# Patient Record
Sex: Female | Born: 1960 | Race: Black or African American | Hispanic: No | Marital: Single | Smoking: Never smoker
Health system: Southern US, Community
[De-identification: ages and names within clinical notes are randomized; demographics above are authoritative.]

## PROBLEM LIST (undated history)

## (undated) DIAGNOSIS — R519 Headache, unspecified: Secondary | ICD-10-CM

## (undated) DIAGNOSIS — K8020 Calculus of gallbladder without cholecystitis without obstruction: Secondary | ICD-10-CM

## (undated) DIAGNOSIS — M1990 Unspecified osteoarthritis, unspecified site: Secondary | ICD-10-CM

## (undated) DIAGNOSIS — I10 Essential (primary) hypertension: Secondary | ICD-10-CM

## (undated) DIAGNOSIS — H409 Unspecified glaucoma: Secondary | ICD-10-CM

## (undated) DIAGNOSIS — E785 Hyperlipidemia, unspecified: Secondary | ICD-10-CM

## (undated) HISTORY — DX: Calculus of gallbladder without cholecystitis without obstruction: K80.20

## (undated) HISTORY — PX: CHOLECYSTECTOMY: SHX55

---

## 1999-05-06 ENCOUNTER — Emergency Department (HOSPITAL_COMMUNITY): Admission: EM | Admit: 1999-05-06 | Discharge: 1999-05-06 | Payer: Self-pay | Admitting: Emergency Medicine

## 1999-05-11 ENCOUNTER — Encounter: Admission: RE | Admit: 1999-05-11 | Discharge: 1999-05-11 | Payer: Self-pay | Admitting: Internal Medicine

## 2002-02-11 ENCOUNTER — Other Ambulatory Visit: Admission: RE | Admit: 2002-02-11 | Discharge: 2002-02-11 | Payer: Self-pay | Admitting: Obstetrics and Gynecology

## 2005-08-15 ENCOUNTER — Other Ambulatory Visit: Admission: RE | Admit: 2005-08-15 | Discharge: 2005-08-15 | Payer: Self-pay | Admitting: Obstetrics and Gynecology

## 2011-08-23 ENCOUNTER — Other Ambulatory Visit: Payer: Self-pay | Admitting: Obstetrics and Gynecology

## 2011-08-23 DIAGNOSIS — Z1231 Encounter for screening mammogram for malignant neoplasm of breast: Secondary | ICD-10-CM

## 2011-09-12 ENCOUNTER — Ambulatory Visit
Admission: RE | Admit: 2011-09-12 | Discharge: 2011-09-12 | Disposition: A | Discharge status: Home / Self Care | Payer: BC Managed Care – PPO | Source: Ambulatory Visit | Attending: Obstetrics and Gynecology | Admitting: Obstetrics and Gynecology

## 2011-09-12 DIAGNOSIS — Z1231 Encounter for screening mammogram for malignant neoplasm of breast: Secondary | ICD-10-CM

## 2011-10-20 ENCOUNTER — Encounter: Payer: Self-pay | Admitting: Obstetrics and Gynecology

## 2013-05-09 ENCOUNTER — Telehealth: Payer: Self-pay

## 2013-05-09 NOTE — Telephone Encounter (Signed)
Patient cancelled appt. and will call back at a later time to reschedule.

## 2013-05-10 ENCOUNTER — Ambulatory Visit: Payer: BC Managed Care – PPO | Admitting: Family Medicine

## 2019-06-27 ENCOUNTER — Ambulatory Visit: Payer: Self-pay | Attending: Internal Medicine

## 2019-06-27 DIAGNOSIS — Z23 Encounter for immunization: Secondary | ICD-10-CM

## 2019-06-27 NOTE — Progress Notes (Signed)
   Covid-19 Vaccination Clinic  Name:  Rose Marquez    MRN: [id number] DOB: July 16, 1960  06/27/2019  [name] was observed post Covid-19 immunization for 15 minutes without incident. She was provided with Vaccine Information Sheet and instruction to access the V-Safe system.   [name] was instructed to call 911 with any severe reactions post vaccine: Marland Kitchen Difficulty breathing  . Swelling of face and throat  . A fast heartbeat  . A bad rash all over body  . Dizziness and weakness   Immunizations Administered    Name Date Dose VIS Date Route   Pfizer COVID-19 Vaccine 06/27/2019 11:02 AM 0.3 mL 03/08/2019 Intramuscular   Manufacturer: [employer], [employer]   Lot: [id number]   NDC: [address]-4

## 2019-07-22 ENCOUNTER — Ambulatory Visit: Payer: Self-pay | Attending: Internal Medicine

## 2019-07-22 DIAGNOSIS — Z23 Encounter for immunization: Secondary | ICD-10-CM

## 2019-07-22 NOTE — Progress Notes (Signed)
   Covid-19 Vaccination Clinic  Name:  Rose Marquez    MRN: [id number] DOB: 28-Aug-1960  07/22/2019  Rose Marquez was observed post Covid-19 immunization for 15 minutes without incident. She was provided with Vaccine Information Sheet and instruction to access the V-Safe system.   Rose Marquez was instructed to call 911 with any severe reactions post vaccine: Marland Kitchen Difficulty breathing  . Swelling of face and throat  . A fast heartbeat  . A bad rash all over body  . Dizziness and weakness   Immunizations Administered    Name Date Dose VIS Date Route   Pfizer COVID-19 Vaccine 07/22/2019 11:45 AM 0.3 mL 05/22/2018 Intramuscular   Manufacturer: [employer], [employer]   Lot: [id number]   NDC: [address]-9

## 2020-08-26 DIAGNOSIS — I671 Cerebral aneurysm, nonruptured: Secondary | ICD-10-CM

## 2020-08-26 HISTORY — DX: Cerebral aneurysm, nonruptured: I67.1

## 2020-09-15 ENCOUNTER — Other Ambulatory Visit: Payer: Self-pay

## 2020-09-15 ENCOUNTER — Inpatient Hospital Stay (HOSPITAL_COMMUNITY)
Admission: EM | Admit: 2020-09-15 | Discharge: 2020-09-22 | DRG: 027 | Disposition: A | Discharge status: Home / Self Care | Payer: BC Managed Care – PPO | Attending: Interventional Radiology | Admitting: Interventional Radiology

## 2020-09-15 ENCOUNTER — Emergency Department (HOSPITAL_COMMUNITY): Payer: BC Managed Care – PPO

## 2020-09-15 ENCOUNTER — Encounter (HOSPITAL_COMMUNITY): Payer: Self-pay

## 2020-09-15 DIAGNOSIS — Z87891 Personal history of nicotine dependence: Secondary | ICD-10-CM

## 2020-09-15 DIAGNOSIS — R519 Headache, unspecified: Secondary | ICD-10-CM

## 2020-09-15 DIAGNOSIS — I729 Aneurysm of unspecified site: Secondary | ICD-10-CM

## 2020-09-15 DIAGNOSIS — Z20822 Contact with and (suspected) exposure to covid-19: Secondary | ICD-10-CM | POA: Diagnosis present

## 2020-09-15 DIAGNOSIS — H532 Diplopia: Secondary | ICD-10-CM | POA: Diagnosis present

## 2020-09-15 DIAGNOSIS — I671 Cerebral aneurysm, nonruptured: Principal | ICD-10-CM

## 2020-09-15 DIAGNOSIS — H02402 Unspecified ptosis of left eyelid: Secondary | ICD-10-CM | POA: Diagnosis present

## 2020-09-15 DIAGNOSIS — I5189 Other ill-defined heart diseases: Secondary | ICD-10-CM

## 2020-09-15 DIAGNOSIS — H510 Palsy (spasm) of conjugate gaze: Secondary | ICD-10-CM | POA: Diagnosis present

## 2020-09-15 DIAGNOSIS — H0520 Unspecified exophthalmos: Secondary | ICD-10-CM | POA: Diagnosis present

## 2020-09-15 DIAGNOSIS — I10 Essential (primary) hypertension: Secondary | ICD-10-CM | POA: Diagnosis present

## 2020-09-15 DIAGNOSIS — R200 Anesthesia of skin: Secondary | ICD-10-CM | POA: Diagnosis present

## 2020-09-15 LAB — CBC WITH DIFFERENTIAL/PLATELET
Abs Immature Granulocytes: 0.04 10*3/uL (ref 0.00–0.07)
Basophils Absolute: 0 10*3/uL (ref 0.0–0.1)
Basophils Relative: 0 %
Eosinophils Absolute: 0 10*3/uL (ref 0.0–0.5)
Eosinophils Relative: 0 %
HCT: 44.1 % (ref 36.0–46.0)
Hemoglobin: 14.3 g/dL (ref 12.0–15.0)
Immature Granulocytes: 0 %
Lymphocytes Relative: 16 %
Lymphs Abs: 1.8 10*3/uL (ref 0.7–4.0)
MCH: 27.4 pg (ref 26.0–34.0)
MCHC: 32.4 g/dL (ref 30.0–36.0)
MCV: 84.5 fL (ref 80.0–100.0)
Monocytes Absolute: 0.6 10*3/uL (ref 0.1–1.0)
Monocytes Relative: 5 %
Neutro Abs: 9.1 10*3/uL — ABNORMAL HIGH (ref 1.7–7.7)
Neutrophils Relative %: 79 %
Platelets: 198 10*3/uL (ref 150–400)
RBC: 5.22 MIL/uL — ABNORMAL HIGH (ref 3.87–5.11)
RDW: 15.4 % (ref 11.5–15.5)
WBC: 11.6 10*3/uL — ABNORMAL HIGH (ref 4.0–10.5)
nRBC: 0 % (ref 0.0–0.2)

## 2020-09-15 LAB — BASIC METABOLIC PANEL
Anion gap: 8 (ref 5–15)
BUN: 11 mg/dL (ref 8–23)
CO2: 26 mmol/L (ref 22–32)
Calcium: 9.1 mg/dL (ref 8.9–10.3)
Chloride: 105 mmol/L (ref 98–111)
Creatinine, Ser: 0.75 mg/dL (ref 0.44–1.00)
GFR, Estimated: >60 mL/min (ref >60)
Glucose, Bld: 117 mg/dL — ABNORMAL HIGH (ref 70–99)
Potassium: 3.7 mmol/L (ref 3.5–5.1)
Sodium: 139 mmol/L (ref 135–145)

## 2020-09-15 LAB — RESP PANEL BY RT-PCR (FLU A&B, COVID) ARPGX2
Influenza A by PCR: NEGATIVE
Influenza B by PCR: NEGATIVE
SARS Coronavirus 2 by RT PCR: NEGATIVE

## 2020-09-15 MED ORDER — IOHEXOL 300 MG/ML  SOLN
100.0000 mL | Freq: Once | INTRAMUSCULAR | Status: AC | PRN
  Administered 2020-09-15: 80 mL via INTRAVENOUS

## 2020-09-15 NOTE — ED Provider Notes (Signed)
Emergency Medicine Provider Triage Evaluation Note  Rose Marquez , a 60 y.o. female  was evaluated in triage.  Pt complains of left periorbital discomfort/numbness x 2 days. Developed Left sided nasal congestion/sinus pressure which seem to spread to L periorbital region with the area around her left eye feeling numb/tingly. Sxs worse with attempts to move the left eye, but is able to move some. Denies double/blurry vision or contact use. Denies any other areas of numbness. > 24 hours of sxs.   Review of Systems  Positive: Periorbital pain/swelling, nasal congestion.  Negative: Fever, vomiting, numbness/weakness to limbs.   Physical Exam  BP (!) 148/94 (BP Location: Right Arm)   Pulse 63   Temp 99.2 F (37.3 C) (Oral)   Resp 16   Ht 5\' 7"  (1.702 m)   Wt 91.6 kg   SpO2 97%   BMI 31.64 kg/m  Gen:   Awake, no distress   Resp:  Normal effort  MSK:   Moves extremities without difficulty  Other:  Mild swelling to the left eyelid with mild left conjunctival injection. No significant periorbital erythema. Pupils are each reactive but asymmetric at this time. Extra ocular movements intact, patient does struggle some with left eye lateral movement. Subjective decreased sensation to left periorbital region compared to contralateral side. CN III-XII otherwise grossly intact. Strength & sensation grossly intact x 4.   Medical Decision Making  Medically screening exam initiated at 3:42 PM.  Appropriate orders placed.  Mirjana Tarleton Hinostroza was informed that the remainder of the evaluation will be completed by another provider, this initial triage assessment does not replace that evaluation, and the importance of remaining in the ED until their evaluation is complete.  Discussed w/ attending Dr. Candice Camp- recommends CT orbits w/ contrast, in agreement, this and basic labs ordered.    Jeraldine Loots 09/15/20 1551    09/17/20, MD 09/15/20 1610

## 2020-09-15 NOTE — ED Triage Notes (Addendum)
Patient c/o left facial pain near the nose/sinus area x 2 days and states the pain is now behind her left eye. Patient also c/o nausea. Patient also c/o double vision when she looks out of both eyes.

## 2020-09-16 ENCOUNTER — Emergency Department (HOSPITAL_COMMUNITY): Payer: BC Managed Care – PPO

## 2020-09-16 ENCOUNTER — Telehealth (HOSPITAL_COMMUNITY): Payer: Self-pay | Admitting: Radiology

## 2020-09-16 DIAGNOSIS — H0520 Unspecified exophthalmos: Secondary | ICD-10-CM | POA: Diagnosis not present

## 2020-09-16 DIAGNOSIS — I10 Essential (primary) hypertension: Secondary | ICD-10-CM | POA: Diagnosis not present

## 2020-09-16 DIAGNOSIS — I671 Cerebral aneurysm, nonruptured: Secondary | ICD-10-CM | POA: Diagnosis present

## 2020-09-16 DIAGNOSIS — Z87891 Personal history of nicotine dependence: Secondary | ICD-10-CM | POA: Diagnosis not present

## 2020-09-16 DIAGNOSIS — H02402 Unspecified ptosis of left eyelid: Secondary | ICD-10-CM | POA: Diagnosis not present

## 2020-09-16 DIAGNOSIS — R519 Headache, unspecified: Secondary | ICD-10-CM | POA: Diagnosis not present

## 2020-09-16 DIAGNOSIS — H510 Palsy (spasm) of conjugate gaze: Secondary | ICD-10-CM | POA: Diagnosis not present

## 2020-09-16 DIAGNOSIS — Z20822 Contact with and (suspected) exposure to covid-19: Secondary | ICD-10-CM | POA: Diagnosis not present

## 2020-09-16 DIAGNOSIS — H532 Diplopia: Secondary | ICD-10-CM | POA: Diagnosis not present

## 2020-09-16 DIAGNOSIS — R200 Anesthesia of skin: Secondary | ICD-10-CM | POA: Diagnosis not present

## 2020-09-16 LAB — CBC
HCT: 42 % (ref 36.0–46.0)
Hemoglobin: 13.9 g/dL (ref 12.0–15.0)
MCH: 27.4 pg (ref 26.0–34.0)
MCHC: 33.1 g/dL (ref 30.0–36.0)
MCV: 82.7 fL (ref 80.0–100.0)
Platelets: 161 10*3/uL (ref 150–400)
RBC: 5.08 MIL/uL (ref 3.87–5.11)
RDW: 15.1 % (ref 11.5–15.5)
WBC: 10.3 10*3/uL (ref 4.0–10.5)
nRBC: 0 % (ref 0.0–0.2)

## 2020-09-16 LAB — HIV ANTIBODY (ROUTINE TESTING W REFLEX): HIV Screen 4th Generation wRfx: NONREACTIVE

## 2020-09-16 LAB — CREATININE, SERUM
Creatinine, Ser: 0.82 mg/dL (ref 0.44–1.00)
GFR, Estimated: >60 mL/min (ref >60)

## 2020-09-16 LAB — MRSA NEXT GEN BY PCR, NASAL: MRSA by PCR Next Gen: NOT DETECTED

## 2020-09-16 MED ORDER — METHYLPREDNISOLONE 4 MG PO TBPK: 4.0000 mg | ORAL_TABLET | Freq: Four times a day (QID) | ORAL | Status: DC

## 2020-09-16 MED ORDER — METHYLPREDNISOLONE 4 MG PO TBPK
8.0000 mg | ORAL_TABLET | Freq: Every morning | ORAL | Status: AC
  Administered 2020-09-16: 8 mg via ORAL
  Filled 2020-09-16 (×2): qty 21

## 2020-09-16 MED ORDER — METHYLPREDNISOLONE 4 MG PO TBPK
8.0000 mg | ORAL_TABLET | Freq: Every evening | ORAL | Status: AC
  Administered 2020-09-17: 8 mg via ORAL

## 2020-09-16 MED ORDER — SODIUM CHLORIDE 0.9 % IV SOLN: INTRAVENOUS | Status: DC

## 2020-09-16 MED ORDER — ADULT MULTIVITAMIN W/MINERALS CH
1.0000 | ORAL_TABLET | Freq: Every day | ORAL | Status: DC
  Administered 2020-09-16 – 2020-09-22 (×6): 1 via ORAL
  Filled 2020-09-16 (×6): qty 1

## 2020-09-16 MED ORDER — FENTANYL CITRATE (PF) 100 MCG/2ML IJ SOLN
50.0000 ug | Freq: Once | INTRAMUSCULAR | Status: AC
  Administered 2020-09-16: 50 ug via INTRAVENOUS
  Filled 2020-09-16: qty 2

## 2020-09-16 MED ORDER — METHYLPREDNISOLONE 4 MG PO TBPK
4.0000 mg | ORAL_TABLET | ORAL | Status: AC
  Administered 2020-09-16: 4 mg via ORAL

## 2020-09-16 MED ORDER — ONDANSETRON HCL 4 MG/2ML IJ SOLN: 4.0000 mg | Freq: Four times a day (QID) | INTRAMUSCULAR | Status: DC | PRN

## 2020-09-16 MED ORDER — METHYLPREDNISOLONE 4 MG PO TBPK
4.0000 mg | ORAL_TABLET | Freq: Three times a day (TID) | ORAL | Status: AC
  Administered 2020-09-17 (×2): 4 mg via ORAL

## 2020-09-16 MED ORDER — FENTANYL CITRATE (PF) 100 MCG/2ML IJ SOLN
25.0000 ug | Freq: Once | INTRAMUSCULAR | Status: AC
  Administered 2020-09-16: 25 ug via INTRAVENOUS
  Filled 2020-09-16: qty 2

## 2020-09-16 MED ORDER — POTASSIUM CHLORIDE IN NACL 20-0.9 MEQ/L-% IV SOLN
INTRAVENOUS | Status: DC
  Filled 2020-09-16 (×5): qty 1000

## 2020-09-16 MED ORDER — GADOBUTROL 1 MMOL/ML IV SOLN
9.5000 mL | Freq: Once | INTRAVENOUS | Status: AC | PRN
  Administered 2020-09-16: 9.5 mL via INTRAVENOUS

## 2020-09-16 MED ORDER — METHYLPREDNISOLONE 4 MG PO TBPK
8.0000 mg | ORAL_TABLET | Freq: Every evening | ORAL | Status: AC
  Administered 2020-09-16: 8 mg via ORAL

## 2020-09-16 MED ORDER — ACETAMINOPHEN 325 MG PO TABS
650.0000 mg | ORAL_TABLET | ORAL | Status: DC | PRN
  Administered 2020-09-17: 650 mg via ORAL
  Filled 2020-09-16 (×6): qty 2

## 2020-09-16 MED ORDER — ONDANSETRON HCL 4 MG PO TABS: 4.0000 mg | ORAL_TABLET | Freq: Four times a day (QID) | ORAL | Status: DC | PRN

## 2020-09-16 MED ORDER — HEPARIN SODIUM (PORCINE) 5000 UNIT/ML IJ SOLN
5000.0000 [IU] | Freq: Three times a day (TID) | INTRAMUSCULAR | Status: DC
  Administered 2020-09-16 – 2020-09-17 (×3): 5000 [IU] via SUBCUTANEOUS
  Filled 2020-09-16 (×3): qty 1

## 2020-09-16 MED ORDER — HYDROCODONE-ACETAMINOPHEN 5-325 MG PO TABS
1.0000 | ORAL_TABLET | ORAL | Status: DC | PRN
  Administered 2020-09-16 – 2020-09-18 (×9): 1 via ORAL
  Filled 2020-09-16 (×9): qty 1

## 2020-09-16 NOTE — H&P (Signed)
  Chief Complaint   Headache  History of Present Illness  Rose Marquez is a 60 y.o. female presenting to the emergency department with approximately 2 days of relatively sudden onset of left-sided headache and retro-orbital pain.  Pain started Sunday without any identifiable inciting event.  She describes initially pain in the medial aspect of her nose on the left side, which has progressed to involve the area around her eye.  She also complains of associated double vision with both eyes open, normal vision with either eye closed.  She has also noted difficulty keeping the left eye open.  She has some numbness involving the scalp around her left eye and the left cheek, no numbness of the left jaw.  She also denies any associated numbness tingling or weakness of the upper or lower extremities.  No difficulty walking.  Of note, she does not report any known medical history although she does not have any regular medical care.  No surgical history.  She denies any tobacco smoking.  No known family history of intracranial aneurysms.  Past Medical History  History reviewed. No pertinent past medical history.  Past Surgical History  History reviewed. No pertinent surgical history.  Social History   Social History   Tobacco Use   Smoking status: Never   Smokeless tobacco: Never  Vaping Use   Vaping Use: Never used  Substance Use Topics   Alcohol use: Never   Drug use: Never    Medications   Prior to Admission medications   Medication Sig Start Date End Date Taking? Authorizing Provider  Multiple Vitamin (MULTIVITAMIN WITH MINERALS) TABS tablet Take 1 tablet by mouth daily.   Yes [provider]    Allergies  No Known Allergies  Review of Systems  ROS  Neurologic Exam  Awake, alert, oriented Memory and concentration grossly intact Speech fluent, appropriate Mild left ptosis, proptosis, no chemosis. EOMI Decreased sensation to LT left V1, V2. Motor exam: Upper  Extremities Deltoid Bicep Tricep Grip  Right 5/5 5/5 5/5 5/5  Left 5/5 5/5 5/5 5/5   Lower Extremities IP Quad PF DF EHL  Right 5/5 5/5 5/5 5/5 5/5  Left 5/5 5/5 5/5 5/5 5/5   Sensation grossly intact to LT  Imaging  MRI and MRA of the brain were personally reviewed.  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  Interestingly, there is also significant enlargement of the left superior ophthalmic vein.  Impression  - 60 y.o. 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  Plan  -I will plan on admitting for observation. -We will start a Medrol Dosepak -We will plan on diagnostic cerebral angiogram for further characterization of the aneurysm  I did review the imaging findings with the patient and her daughter.  We discussed the need for further characterization with the angiogram.  We did discuss the details of the procedure as well as the expected postoperative course including the likelihood of flow diverting stent as a treatment modality.  We discussed the risks of the angiogram procedure.  All their questions today were answered.  Patient is willing to proceed with the angiogram as above.  Lisbeth Renshaw, MD [facility]

## 2020-09-16 NOTE — ED Notes (Signed)
Pt returned from MRI. Pt hooked back up to monitor. Pt reports 10/10 pain on left side of face

## 2020-09-16 NOTE — ED Notes (Addendum)
Neuro surgeon at bedside, aware of increased numbness left face/head.

## 2020-09-16 NOTE — Progress Notes (Signed)
Patient and patient's daughter and family are requesting that no interventions to be done to the patient until Dr Corliss Skains can evaluate and take over patient's care. Neurosurgery on call has been notified but were unable to help with this transition of attendings and stated that Dr Conchita Paris would need to be notified in the morning.

## 2020-09-16 NOTE — ED Provider Notes (Signed)
Patient with 2-day headache and abnormal CT, sent from [facility] for MRI/MRA.  Imaging has been performed.  Imaging does show large ICA aneurysm with clot.  Discussed with Dr. Conchita Paris.  Will see patient in ED this morning for admission for formal angiogram.   Gilda Crease, MD 09/16/20 [phone]

## 2020-09-16 NOTE — ED Notes (Signed)
[facility] called for transport and charge RN at [employer] aware

## 2020-09-16 NOTE — ED Notes (Addendum)
Pt remains in MRI at this time  

## 2020-09-16 NOTE — ED Notes (Signed)
Assume care of pt today at 11:15, admission orders released by this RN.

## 2020-09-16 NOTE — ED Notes (Signed)
Consent signed by patient and neuro and remains at bedside.

## 2020-09-16 NOTE — ED Notes (Signed)
Pt to MRI

## 2020-09-16 NOTE — ED Notes (Addendum)
Pt arrived via [facility]. This RN received report Pt had headache that she reports started on Sunday + blurred vision + left sided facial numbness.  Pt pupils are reactive but unequal in size. R pupil 5 L pupil 3.  Pt has equal grip and no arm or leg droop.  Pt BP 167/99 with [facility] and pt brady with HR between 48-54

## 2020-09-16 NOTE — ED Notes (Signed)
Pt changed into gown - jewelry removed and placed in belonging bag along with pt clothes

## 2020-09-16 NOTE — Telephone Encounter (Signed)
Had a lengthy phone call with patient's daughter (Chanel). She states that her mother has a large aneurysm and they have been assigned to neurosurgery. The daughter states that they are personally requesting that Dr. Corliss Skains, MD be the physician to diagnose and treat the patient. I have spoken to Dr. Corliss Skains concerning this matter. I told the patient's daughter that they would need to make this decision and let the faculty know of their choice. Dr. Corliss Skains is available for this patient if that be their choice. JM

## 2020-09-16 NOTE — ED Provider Notes (Signed)
[facility]-EMERGENCY DEPT Provider Note   CSN: [id number] Arrival date & time: 09/15/20  1428     History No chief complaint on file.   Rose Marquez is a 60 y.o. female.  Patient presents to the emergency department with a chief complaint of sinus headache.  She states that her symptoms started 2 days ago.  She denies any fevers or chills.  She states that the headache started around her nose and has progressively worsened.  She reports associated diplopia and left forehead numbness.  Both the diplopia and forehead numbness started more than 24 hours ago.  She denies any successful treatments PTA.  The history is provided by the patient. No language interpreter was used.      History reviewed. No pertinent past medical history.  There are no problems to display for this patient.   History reviewed. No pertinent surgical history.   OB History   No obstetric history on file.     Family History  Problem Relation Age of Onset   Alzheimer's disease Father     Social History   Tobacco Use   Smoking status: Never   Smokeless tobacco: Never  Vaping Use   Vaping Use: Never used  Substance Use Topics   Alcohol use: Never   Drug use: Never    Home Medications Prior to Admission medications   Not on File    Allergies    Patient has no allergy information on record.  Review of Systems   Review of Systems  All other systems reviewed and are negative.  Physical Exam Updated Vital Signs BP (!) 182/98   Pulse (!) 53   Temp 99.2 F (37.3 C) (Oral)   Resp 16   Ht 5\' 7"  (1.702 m)   Wt 91.6 kg   SpO2 96%   BMI 31.64 kg/m   Physical Exam Vitals and nursing note reviewed.  Constitutional:      General: She is not in acute distress.    Appearance: She is well-developed.  HENT:     Head: Normocephalic and atraumatic.  Eyes:     Extraocular Movements: Extraocular movements intact.     Conjunctiva/sclera: Conjunctivae normal.      Pupils: Pupils are equal, round, and reactive to light.  Cardiovascular:     Rate and Rhythm: Normal rate and regular rhythm.     Heart sounds: No murmur heard. Pulmonary:     Effort: Pulmonary effort is normal. No respiratory distress.     Breath sounds: Normal breath sounds.  Abdominal:     Palpations: Abdomen is soft.     Tenderness: There is no abdominal tenderness.  Musculoskeletal:     Cervical back: Neck supple.  Skin:    General: Skin is warm and dry.  Neurological:     Mental Status: She is alert.     Comments: Diplopia, eyes do appear to track symmetrically  Difficulty with finger to nose on first attempt, then normal on second No pronator drift Sensation decreased on left forehead when compared to right Normal ROM and strength of extremities    ED Results / Procedures / Treatments   Labs (all labs ordered are listed, but only abnormal results are displayed) Labs Reviewed  CBC WITH DIFFERENTIAL/PLATELET - Abnormal; Notable for the following components:      Result Value   WBC 11.6 (*)    RBC 5.22 (*)    Neutro Abs 9.1 (*)    All other components within normal limits  BASIC METABOLIC PANEL - Abnormal; Notable for the following components:   Glucose, Bld 117 (*)    All other components within normal limits  RESP PANEL BY RT-PCR (FLU A&B, COVID) [id number]    EKG None  Radiology CT Head Wo Contrast  Result Date: 09/15/2020 CLINICAL DATA:  Headache EXAM: CT HEAD WITHOUT CONTRAST TECHNIQUE: Contiguous axial images were obtained from the base of the skull through the vertex without intravenous contrast. COMPARISON:  None. FINDINGS: Brain: No acute territorial infarction or hemorrhage is visualized. Extra-axial mass adjacent to the medial left temporal lobe, appears to be associated with cavernous sinus. This measures 19 mm AP by 22 mm transverse. Peripheral high density within the lesion. Mild mass effect on adjacent temporal lobe. Ventricles nonenlarged Vascular: No  unexpected calcification. Skull: No fracture Sinuses/Orbits: Sinuses are clear.  The globes appear intact. Other: None IMPRESSION: 19 x 22 mm left cavernous sinus lesion which could represent a mass or giant aneurysm. Contrast-enhanced MRI is recommended for further evaluation. Electronically Signed   By: Jasmine Pang M.D.   On: 09/15/2020 23:02   CT Orbits W Contrast  Result Date: 09/15/2020 CLINICAL DATA:  Initial evaluation for anisocoria, left periorbital discomfort/numbness, pain with left lateral gaze. EXAM: CT ORBITS WITH CONTRAST TECHNIQUE: Multidetector CT images was performed according to the standard protocol following intravenous contrast administration. CONTRAST:  74mL OMNIPAQUE IOHEXOL 300 MG/ML  SOLN COMPARISON:  Concomitant CT of the head. FINDINGS: Orbits: Globes are symmetric in size with normal appearance and morphology. Optic nerves symmetric and normal. Extra-ocular muscles symmetric and within normal limits. Lacrimal glands within normal limits. Intraconal and extraconal fat well-maintained. Prominence of the superior orbital veins seen bilaterally, left greater than right. 19 x 22 mm irregular hypodensity/filling defect seen centered at the left cavernous sinus (series 2, image 11). Abnormality extends medially to involve the sella at the midline. Opacification/filling of the cavernous sinus seen around this lesion. Associated mild osseous remodeling at the adjacent skull base. Findings indeterminate, and could reflect a mass versus large partially thrombosed aneurysm. Visible paranasal sinuses: Mild scattered mucosal thickening noted within the ethmoidal air cells, maxillary, and sphenoid sinuses. No air-fluid levels to suggest acute sinusitis. Mastoid air cells and middle ear cavities are well pneumatized and free of fluid. Sinuses. Soft tissues: Periorbital soft tissues demonstrate no acute finding. Osseous: Visualized osseous structures demonstrate no acute finding. Few periapical  lucencies noted about the visualized teeth. Limited intracranial: Remainder of the visualized brain unremarkable. IMPRESSION: 1. 19 x 22 mm irregular hypodensity/filling defect centered at the left cavernous sinus. Finding is indeterminate, and could reflect a mass versus large partially thrombosed aneurysm. Further evaluation with dedicated brain MRI, with and without contrast recommended. A pituitary protocol MRI would likely be helpful. Corresponding MRA of the circle-of-Willis is also recommended for further evaluation. 2. Otherwise negative CT of the orbits. Electronically Signed   By: Rise Mu M.D.   On: 09/15/2020 23:33    Procedures .Critical Care  Date/Time: 09/16/2020 12:15 AM Performed by: Roxy Horseman, PA-C Authorized by: Roxy Horseman, PA-C   Critical care provider statement:    Critical care time (minutes):  50   Critical care was necessary to treat or prevent imminent or life-threatening deterioration of the following conditions:  CNS failure or compromise   Critical care was time spent personally by me on the following activities:  Discussions with consultants, evaluation of patient's response to treatment, examination of patient, ordering and performing treatments and interventions, ordering and review of laboratory  studies, ordering and review of radiographic studies, pulse oximetry, re-evaluation of patient's condition, obtaining history from patient or surrogate and review of old charts   Medications Ordered in ED Medications  iohexol (OMNIPAQUE) 300 MG/ML solution 100 mL (80 mLs Intravenous Contrast Given 09/15/20 2237)    ED Course  I have reviewed the triage vital signs and the nursing notes.  Pertinent labs & imaging results that were available during my care of the patient were reviewed by me and considered in my medical decision making (see chart for details).    MDM Rules/Calculators/A&P                          Patient here with sinus headache.   States that she has also had some diplopia and left forehead numbness.  CT orbits with contrast was ordered in triage.  I added on CT head.  CTs are concerning for brain mass versus aneurysm.  I discussed case with Dr. Rubin Payor, who recommends transfer to [facility] for emergent MRIs per radiology.  Will need MRI with and without contrast (pituitary protocol). MRI with and without (circle of Willis).  I briefly discussed the case with Dr. Amada Jupiter, who says nothing needed from neurology, defers to neurosurgery for treatment plan after imaging.  Case discussed with Dr. Blinda Leatherwood at [facility], who accepts in transfer.    I discussed the test results and care plan with the patient. She understands and agrees.  Final Clinical Impression(s) / ED Diagnoses Final diagnoses:  Nonintractable headache, unspecified chronicity pattern, unspecified headache type    Rx / DC Orders ED Discharge Orders     None        Roxy Horseman, PA-C 09/16/20 Lloyd Huger, MD 09/16/20 [phone]

## 2020-09-17 ENCOUNTER — Observation Stay (HOSPITAL_COMMUNITY): Payer: BC Managed Care – PPO

## 2020-09-17 ENCOUNTER — Other Ambulatory Visit (HOSPITAL_COMMUNITY): Payer: Self-pay

## 2020-09-17 DIAGNOSIS — I671 Cerebral aneurysm, nonruptured: Secondary | ICD-10-CM | POA: Diagnosis present

## 2020-09-17 DIAGNOSIS — H02402 Unspecified ptosis of left eyelid: Secondary | ICD-10-CM | POA: Diagnosis present

## 2020-09-17 DIAGNOSIS — H510 Palsy (spasm) of conjugate gaze: Secondary | ICD-10-CM | POA: Diagnosis present

## 2020-09-17 DIAGNOSIS — Z20822 Contact with and (suspected) exposure to covid-19: Secondary | ICD-10-CM | POA: Diagnosis present

## 2020-09-17 DIAGNOSIS — Z87891 Personal history of nicotine dependence: Secondary | ICD-10-CM | POA: Diagnosis not present

## 2020-09-17 DIAGNOSIS — R519 Headache, unspecified: Secondary | ICD-10-CM | POA: Diagnosis present

## 2020-09-17 DIAGNOSIS — R9431 Abnormal electrocardiogram [ECG] [EKG]: Secondary | ICD-10-CM | POA: Diagnosis not present

## 2020-09-17 DIAGNOSIS — I159 Secondary hypertension, unspecified: Secondary | ICD-10-CM | POA: Diagnosis not present

## 2020-09-17 DIAGNOSIS — H532 Diplopia: Secondary | ICD-10-CM | POA: Diagnosis present

## 2020-09-17 DIAGNOSIS — I10 Essential (primary) hypertension: Secondary | ICD-10-CM | POA: Diagnosis present

## 2020-09-17 DIAGNOSIS — H0520 Unspecified exophthalmos: Secondary | ICD-10-CM | POA: Diagnosis present

## 2020-09-17 DIAGNOSIS — R200 Anesthesia of skin: Secondary | ICD-10-CM | POA: Diagnosis present

## 2020-09-17 HISTORY — PX: IR ANGIO INTRA EXTRACRAN SEL COM CAROTID INNOMINATE BILAT MOD SED: IMG5360

## 2020-09-17 HISTORY — PX: IR ANGIO VERTEBRAL SEL VERTEBRAL UNI R MOD SED: IMG5368

## 2020-09-17 HISTORY — PX: IR US GUIDE VASC ACCESS RIGHT: IMG2390

## 2020-09-17 MED ORDER — NITROGLYCERIN 1 MG/10 ML FOR IR/CATH LAB
INTRA_ARTERIAL | Status: AC
  Filled 2020-09-17: qty 10

## 2020-09-17 MED ORDER — FENTANYL CITRATE (PF) 100 MCG/2ML IJ SOLN
INTRAMUSCULAR | Status: AC | PRN
  Administered 2020-09-17: 25 ug via INTRAVENOUS

## 2020-09-17 MED ORDER — VERAPAMIL HCL 2.5 MG/ML IV SOLN
INTRAVENOUS | Status: AC
  Filled 2020-09-17: qty 2

## 2020-09-17 MED ORDER — HEPARIN SODIUM (PORCINE) 1000 UNIT/ML IJ SOLN
INTRAMUSCULAR | Status: AC
  Filled 2020-09-17: qty 1

## 2020-09-17 MED ORDER — IOHEXOL 300 MG/ML  SOLN
100.0000 mL | Freq: Once | INTRAMUSCULAR | Status: AC | PRN
  Administered 2020-09-17: 100 mL via INTRA_ARTERIAL

## 2020-09-17 MED ORDER — NITROGLYCERIN 1 MG/10 ML FOR IR/CATH LAB
INTRA_ARTERIAL | Status: AC | PRN
  Administered 2020-09-17 (×2): 200 ug via INTRA_ARTERIAL

## 2020-09-17 MED ORDER — MIDAZOLAM HCL 2 MG/2ML IJ SOLN
INTRAMUSCULAR | Status: AC | PRN
  Administered 2020-09-17: 1 mg via INTRAVENOUS

## 2020-09-17 MED ORDER — HYDRALAZINE HCL 20 MG/ML IJ SOLN
INTRAMUSCULAR | Status: AC | PRN
Start: 2020-09-17 — End: 2020-09-17
  Administered 2020-09-17 (×2): 5 mg via INTRAVENOUS

## 2020-09-17 MED ORDER — HEPARIN SODIUM (PORCINE) 1000 UNIT/ML IJ SOLN
INTRAMUSCULAR | Status: AC | PRN
  Administered 2020-09-17: 2000 [IU] via INTRA_ARTERIAL

## 2020-09-17 MED ORDER — ASPIRIN EC 81 MG PO TBEC
81.0000 mg | DELAYED_RELEASE_TABLET | Freq: Every day | ORAL | Status: DC
  Administered 2020-09-17 – 2020-09-18 (×2): 81 mg via ORAL
  Filled 2020-09-17 (×2): qty 1

## 2020-09-17 MED ORDER — TICAGRELOR 90 MG PO TABS
180.0000 mg | ORAL_TABLET | Freq: Once | ORAL | Status: AC
  Administered 2020-09-17: 180 mg via ORAL
  Filled 2020-09-17: qty 2

## 2020-09-17 MED ORDER — LIDOCAINE HCL 1 % IJ SOLN
INTRAMUSCULAR | Status: AC
  Filled 2020-09-17: qty 20

## 2020-09-17 MED ORDER — TICAGRELOR 90 MG PO TABS
90.0000 mg | ORAL_TABLET | Freq: Two times a day (BID) | ORAL | Status: DC
  Administered 2020-09-17 – 2020-09-18 (×2): 90 mg via ORAL
  Filled 2020-09-17 (×2): qty 1

## 2020-09-17 MED ORDER — MIDAZOLAM HCL 2 MG/2ML IJ SOLN
INTRAMUSCULAR | Status: AC
  Filled 2020-09-17: qty 2

## 2020-09-17 MED ORDER — SODIUM CHLORIDE 0.9 % IV SOLN: INTRAVENOUS | Status: DC

## 2020-09-17 MED ORDER — FENTANYL CITRATE (PF) 100 MCG/2ML IJ SOLN
INTRAMUSCULAR | Status: AC
  Filled 2020-09-17: qty 2

## 2020-09-17 MED ORDER — HYDRALAZINE HCL 20 MG/ML IJ SOLN
INTRAMUSCULAR | Status: AC
  Filled 2020-09-17: qty 1

## 2020-09-17 NOTE — Consult Note (Signed)
Chief Complaint: Patient was seen in consultation today for a diagnostic cerebral angiogram  Supervising Physician: Julieanne Cotton  Patient Status: [facility] - In-pt  History of Present Illness: Rose Marquez is a 60 y.o. female with no significant past medical history other than previous tobacco use who presented to [facility] ED on 6/21 with complaints of left facial and left eye pain, left forehead numbness, nausea and double vision x 2 days. CT head/orbits showed a 19 x 22 mm irregular hypodensity/filling defect centered at the left cavernous sinus. 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 She was transferred to [facility] and NIR has been consulted for a diagnostic cerebral angiogram to further evaluate these findings and assess for possible future treatment.  Patient seen in her room today, daughter at bedside. She states that she started to experience a sinus headache on Sunday which is not uncommon for her, although this time it felt different. Later that same evening she began to have n/v and heavy sweating. She then developed worsening pain of her left sinus, double vision and left sided forehead numbness which prompted her to come to the ED. She does not recall any precipitating event and has never had anything like this before. She continues to have pain in the left sinus/eye area, her eyeball is tender to touch. She has difficulty opening her left eye fully and it feels as if her bottom lid is rolling up into her eye when she opens it which causes additional discomfort. She has a lot of pain when moving her eyes and has double vision (one image on top of the other) when looking to the extreme left or right. She continues to have left forehead  numbness.  History reviewed. No pertinent past medical history.  History reviewed. No pertinent surgical history.  Allergies: Patient has no known allergies.  Medications: Prior to Admission medications   Medication Sig Start Date End Date Taking? Authorizing Provider  Multiple Vitamin (MULTIVITAMIN WITH MINERALS) TABS tablet Take 1 tablet by mouth daily.   Yes [provider]     Family History  Problem Relation Age of Onset   Alzheimer's disease Father     Social History   Socioeconomic History   Marital status: Single    Spouse name: Not on file   Number of children: Not on file   Years of education: Not on file   Highest education level: Not on file  Occupational History   Not on file  Tobacco Use   Smoking status: Never   Smokeless tobacco: Never  Vaping Use   Vaping Use: Never used  Substance and Sexual Activity   Alcohol use: Never   Drug use: Never   Sexual activity: Not on file  Other Topics Concern   Not on file  Social History Narrative   Not on file   Social Determinants of Health   Financial Resource Strain: Not on file  Food Insecurity: Not on file  Transportation Needs: Not on file  Physical Activity: Not on file  Stress: Not on file  Social Connections: Not on file     Review of Systems: A 12 point ROS discussed and pertinent positives are indicated in the HPI above.  All other systems are negative.  Review  of Systems  Constitutional:  Positive for diaphoresis. Negative for appetite change, chills and fever.  HENT:  Positive for sinus pain. Negative for ear pain, facial swelling and tinnitus.   Eyes:  Positive for pain, redness and visual disturbance (double vision when looking to extreme left or right).  Respiratory:  Negative for cough and shortness of breath.   Cardiovascular:  Negative for chest pain and leg swelling.  Gastrointestinal:  Negative for abdominal pain, diarrhea, nausea and vomiting.  Musculoskeletal:  Negative  for back pain.  Skin:  Negative for color change and rash.  Neurological:  Positive for numbness (left forehead) and headaches. Negative for dizziness, tremors, syncope, facial asymmetry, speech difficulty, weakness and light-headedness.  Psychiatric/Behavioral:  Negative for confusion.    Vital Signs: BP (!) 159/93 (BP Location: Right Arm)   Pulse (!) 53   Temp 99.2 F (37.3 C) (Oral)   Resp 14   Ht 5\' 7"  (1.702 m)   Wt 202 lb (91.6 kg)   SpO2 96%   BMI 31.64 kg/m   Physical Exam Vitals and nursing note reviewed.  Constitutional:      General: She is not in acute distress. HENT:     Head: Normocephalic.     Mouth/Throat:     Mouth: Mucous membranes are moist.     Pharynx: Oropharynx is clear. No oropharyngeal exudate or posterior oropharyngeal erythema.  Eyes:     Comments: (+) left sided exophthalmos, swelling of left upper eyelid, left eye redness. Unable to fully open left eyelid  Cardiovascular:     Rate and Rhythm: Normal rate and regular rhythm.  Pulmonary:     Effort: Pulmonary effort is normal.     Breath sounds: Normal breath sounds.  Abdominal:     General: There is no distension.     Palpations: Abdomen is soft.     Tenderness: There is no abdominal tenderness.  Skin:    General: Skin is warm and dry.  Neurological:     Mental Status: She is alert.   Alert, awake, and oriented x 4 Speech and comprehension in tact Right pupil slightly larger than left, both reactive to light EOMs without nystagmus. Pain with EOM. Double vision (one image on top of the other) when looking to extreme left or right. Visual fields grossly in tact No facial asymmetry. Tongue midline Motor power full in all 4 extremities Fine motor and coordination in tact    MD Evaluation Airway: WNL Heart: WNL Abdomen: WNL Chest/ Lungs: WNL ASA  Classification: 2 Mallampati/Airway Score: Two   Imaging: CT Head Wo Contrast  Result Date: 09/15/2020 CLINICAL DATA:  Headache EXAM:  CT HEAD WITHOUT CONTRAST TECHNIQUE: Contiguous axial images were obtained from the base of the skull through the vertex without intravenous contrast. COMPARISON:  None. FINDINGS: Brain: No acute territorial infarction or hemorrhage is visualized. Extra-axial mass adjacent to the medial left temporal lobe, appears to be associated with cavernous sinus. This measures 19 mm AP by 22 mm transverse. Peripheral high density within the lesion. Mild mass effect on adjacent temporal lobe. Ventricles nonenlarged Vascular: No unexpected calcification. Skull: No fracture Sinuses/Orbits: Sinuses are clear.  The globes appear intact. Other: None IMPRESSION: 19 x 22 mm left cavernous sinus lesion which could represent a mass or giant aneurysm. Contrast-enhanced MRI is recommended for further evaluation. Electronically Signed   By: 09/17/2020 M.D.   On: 09/15/2020 23:02   MR ANGIO HEAD WO CONTRAST  Result Date: 09/16/2020 CLINICAL DATA:  Initial  evaluation for acute headache, left-sided visual disturbance. EXAM: MRI HEAD WITHOUT AND WITH CONTRAST MRA HEAD WITHOUT CONTRAST TECHNIQUE: Multiplanar, multi-echo pulse sequences of the brain and surrounding structures were acquired without and with intravenous contrast. Angiographic images of the Circle of Willis were acquired using MRA technique without intravenous contrast. CONTRAST:  9.605mL GADAVIST GADOBUTROL 1 MMOL/ML IV SOLN COMPARISON:  Prior CTs from 09/15/2020. FINDINGS: MRI HEAD FINDINGS Brain: Cerebral volume within normal limits. Scattered patchy T2/FLAIR hyperintensity within the periventricular deep white matter both cerebral hemispheres most consistent with chronic small vessel ischemic disease, mild in nature. No evidence for acute or subacute infarct. Gray-white matter differentiation maintained. No encephalomalacia to suggest chronic cortical infarction. No acute or chronic intracranial hemorrhage. Previously identified lesion centered at the left cavernous sinus  again seen. Lesion measures 2.2 x 2.3 x 1.8 cm (AP by transverse by craniocaudad). Lesion demonstrates heterogeneous T2 signal intensity with a prominent peripheral flow void. Nonenhancing central T2 hypointense component measures up to 2.1 cm. Associated susceptibility artifact suggesting blood products. Following contrast administration, enhancement seen throughout the surrounding left cavernous sinus and likely within the periphery of this lesion, with no enhancement centrally. Finding felt to be most consistent with a large cavernous left ICA aneurysm with prominent central thrombosed component. Secondary mass effect on the adjacent pituitary gland and pituitary stalk which are deviated to the right (series 32, image 8). Supraclinoid left ICA displaced superiorly, contacting the undersurface of the left optic chiasm (series 32, image 9). Asymmetric prominence of the left superior ophthalmic vein suggests a degree of associated venous congestion related to compression from this lesion. No definite cavernous sinus thrombosis. No edema within the adjacent left temporal lobe. No other mass lesion, mass effect, or midline shift. Slight asymmetry of the lateral ventricles without hydrocephalus. No extra-axial fluid collection. No other abnormal enhancement. Vascular: Large cavernous left ICA aneurysm as above. Major intracranial vascular flow voids maintained. Skull and upper cervical spine: Craniocervical junction within normal limits. Bone marrow signal intensity normal. No scalp soft tissue abnormality. Sinuses/Orbits: Asymmetric prominence of the left superior orbital vein suggestive of venous congestion due to compression from the left ICA aneurysm. Possible CC fistula difficult to exclude. Globes and orbital soft tissues demonstrate no other acute finding. No more than mild mucosal thickening noted within the sphenoethmoidal and maxillary sinuses. No significant mastoid effusion. Inner ear structures grossly  normal. Other: None. MRA HEAD FINDINGS Anterior circulation: Visualized distal cervical segments of the internal carotid arteries are patent with antegrade flow. Petrous segments patent bilaterally. Scattered atheromatous irregularity within the cavernous and supraclinoid right ICA without significant stenosis. On the left, probable large cavernous left ICA aneurysm is seen, measuring approximately 1.9 x 2.4 x 1.8 cm on this portion of the exam. Probable partial thrombosis better appreciated on corresponding brain MRI. There is filling and flow related signal seen extending into the left superior orbital veins (series 1097, image 4). While this could be related to passive congestion from compression by the large aneurysm, a possible fistulous connection (CC fistula) may also be present. Supraclinoid left ICA widely patent distally to the terminus. A1 segments patent bilaterally. Normal anterior communicating artery complex. Anterior cerebral arteries patent to their distal aspects without stenosis. No M1 stenosis or occlusion. Normal MCA bifurcations. MCA branches well perfused and symmetric. Diffuse small vessel atheromatous irregularity noted. Posterior circulation: Both V4 segments patent to the vertebrobasilar junction without stenosis. Left vertebral artery slightly dominant. Right PICA origin patent and normal. Left PICA not seen. Basilar  patent to its distal aspect without stenosis. Superior cerebellar arteries patent bilaterally. Both PCAs primarily supplied via the basilar, although a small left posterior communicating artery noted. PCAs perfused to their distal aspects without significant stenosis. Anatomic variants: None significant. IMPRESSION: MRI HEAD IMPRESSION: 1. 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 Localized mass effect as above. 2. Asymmetric prominence and filling of the left  superior orbital vein. While this finding may be secondary to compression by the large aneurysm with associated passive congestion, a possible fistulous component (CC fistula) is difficult to exclude, and may be present as well. Further assessment with dedicated catheter directed arteriogram suggested for definitive characterization. 3. No other acute intracranial abnormality. 4. Underlying mild chronic microvascular ischemic disease for age. MRA HEAD IMPRESSION: 1. Large cavernous left ICA aneurysm as above. 2. No other large vessel occlusion. No proximal high-grade or correctable stenosis. 3. Diffuse small vessel atheromatous irregularity. Electronically Signed   By: Rise Mu M.D.   On: 09/16/2020 03:52   MR Brain W and Wo Contrast  Result Date: 09/16/2020 CLINICAL DATA:  Initial evaluation for acute headache, left-sided visual disturbance. EXAM: MRI HEAD WITHOUT AND WITH CONTRAST MRA HEAD WITHOUT CONTRAST TECHNIQUE: Multiplanar, multi-echo pulse sequences of the brain and surrounding structures were acquired without and with intravenous contrast. Angiographic images of the Circle of Willis were acquired using MRA technique without intravenous contrast. CONTRAST:  9.63mL GADAVIST GADOBUTROL 1 MMOL/ML IV SOLN COMPARISON:  Prior CTs from 09/15/2020. FINDINGS: MRI HEAD FINDINGS Brain: Cerebral volume within normal limits. Scattered patchy T2/FLAIR hyperintensity within the periventricular deep white matter both cerebral hemispheres most consistent with chronic small vessel ischemic disease, mild in nature. No evidence for acute or subacute infarct. Gray-white matter differentiation maintained. No encephalomalacia to suggest chronic cortical infarction. No acute or chronic intracranial hemorrhage. Previously identified lesion centered at the left cavernous sinus again seen. Lesion measures 2.2 x 2.3 x 1.8 cm (AP by transverse by craniocaudad). Lesion demonstrates heterogeneous T2 signal intensity with a  prominent peripheral flow void. Nonenhancing central T2 hypointense component measures up to 2.1 cm. Associated susceptibility artifact suggesting blood products. Following contrast administration, enhancement seen throughout the surrounding left cavernous sinus and likely within the periphery of this lesion, with no enhancement centrally. Finding felt to be most consistent with a large cavernous left ICA aneurysm with prominent central thrombosed component. Secondary mass effect on the adjacent pituitary gland and pituitary stalk which are deviated to the right (series 32, image 8). Supraclinoid left ICA displaced superiorly, contacting the undersurface of the left optic chiasm (series 32, image 9). Asymmetric prominence of the left superior ophthalmic vein suggests a degree of associated venous congestion related to compression from this lesion. No definite cavernous sinus thrombosis. No edema within the adjacent left temporal lobe. No other mass lesion, mass effect, or midline shift. Slight asymmetry of the lateral ventricles without hydrocephalus. No extra-axial fluid collection. No other abnormal enhancement. Vascular: Large cavernous left ICA aneurysm as above. Major intracranial vascular flow voids maintained. Skull and upper cervical spine: Craniocervical junction within normal limits. Bone marrow signal intensity normal. No scalp soft tissue abnormality. Sinuses/Orbits: Asymmetric prominence of the left superior orbital vein suggestive of venous congestion due to compression from the left ICA aneurysm. Possible CC fistula difficult to exclude. Globes and orbital soft tissues demonstrate no other acute finding. No more than mild mucosal thickening noted within  the sphenoethmoidal and maxillary sinuses. No significant mastoid effusion. Inner ear structures grossly normal. Other: None. MRA HEAD FINDINGS Anterior circulation: Visualized distal cervical segments of the internal carotid arteries are patent with  antegrade flow. Petrous segments patent bilaterally. Scattered atheromatous irregularity within the cavernous and supraclinoid right ICA without significant stenosis. On the left, probable large cavernous left ICA aneurysm is seen, measuring approximately 1.9 x 2.4 x 1.8 cm on this portion of the exam. Probable partial thrombosis better appreciated on corresponding brain MRI. There is filling and flow related signal seen extending into the left superior orbital veins (series 1097, image 4). While this could be related to passive congestion from compression by the large aneurysm, a possible fistulous connection (CC fistula) may also be present. Supraclinoid left ICA widely patent distally to the terminus. A1 segments patent bilaterally. Normal anterior communicating artery complex. Anterior cerebral arteries patent to their distal aspects without stenosis. No M1 stenosis or occlusion. Normal MCA bifurcations. MCA branches well perfused and symmetric. Diffuse small vessel atheromatous irregularity noted. Posterior circulation: Both V4 segments patent to the vertebrobasilar junction without stenosis. Left vertebral artery slightly dominant. Right PICA origin patent and normal. Left PICA not seen. Basilar patent to its distal aspect without stenosis. Superior cerebellar arteries patent bilaterally. Both PCAs primarily supplied via the basilar, although a small left posterior communicating artery noted. PCAs perfused to their distal aspects without significant stenosis. Anatomic variants: None significant. IMPRESSION: MRI HEAD IMPRESSION: 1. 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 Localized mass effect as above. 2. Asymmetric prominence and filling of the left superior orbital vein. While this finding may be secondary to compression by the large aneurysm with associated passive congestion, a possible  fistulous component (CC fistula) is difficult to exclude, and may be present as well. Further assessment with dedicated catheter directed arteriogram suggested for definitive characterization. 3. No other acute intracranial abnormality. 4. Underlying mild chronic microvascular ischemic disease for age. MRA HEAD IMPRESSION: 1. Large cavernous left ICA aneurysm as above. 2. No other large vessel occlusion. No proximal high-grade or correctable stenosis. 3. Diffuse small vessel atheromatous irregularity. Electronically Signed   By: Rise Mu M.D.   On: 09/16/2020 03:52   CT Orbits W Contrast  Result Date: 09/15/2020 CLINICAL DATA:  Initial evaluation for anisocoria, left periorbital discomfort/numbness, pain with left lateral gaze. EXAM: CT ORBITS WITH CONTRAST TECHNIQUE: Multidetector CT images was performed according to the standard protocol following intravenous contrast administration. CONTRAST:  80mL OMNIPAQUE IOHEXOL 300 MG/ML  SOLN COMPARISON:  Concomitant CT of the head. FINDINGS: Orbits: Globes are symmetric in size with normal appearance and morphology. Optic nerves symmetric and normal. Extra-ocular muscles symmetric and within normal limits. Lacrimal glands within normal limits. Intraconal and extraconal fat well-maintained. Prominence of the superior orbital veins seen bilaterally, left greater than right. 19 x 22 mm irregular hypodensity/filling defect seen centered at the left cavernous sinus (series 2, image 11). Abnormality extends medially to involve the sella at the midline. Opacification/filling of the cavernous sinus seen around this lesion. Associated mild osseous remodeling at the adjacent skull base. Findings indeterminate, and could reflect a mass versus large partially thrombosed aneurysm. Visible paranasal sinuses: Mild scattered mucosal thickening noted within the ethmoidal air cells, maxillary, and sphenoid sinuses. No air-fluid levels to suggest acute sinusitis. Mastoid air  cells and middle ear cavities are well pneumatized and free of fluid.  Sinuses. Soft tissues: Periorbital soft tissues demonstrate no acute finding. Osseous: Visualized osseous structures demonstrate no acute finding. Few periapical lucencies noted about the visualized teeth. Limited intracranial: Remainder of the visualized brain unremarkable. IMPRESSION: 1. 19 x 22 mm irregular hypodensity/filling defect centered at the left cavernous sinus. Finding is indeterminate, and could reflect a mass versus large partially thrombosed aneurysm. Further evaluation with dedicated brain MRI, with and without contrast recommended. A pituitary protocol MRI would likely be helpful. Corresponding MRA of the circle-of-Willis is also recommended for further evaluation. 2. Otherwise negative CT of the orbits. Electronically Signed   By: Rise Mu M.D.   On: 09/15/2020 23:33    Labs:  CBC: Recent Labs    09/15/20 1609 09/16/20 1336  WBC 11.6* 10.3  HGB 14.3 13.9  HCT 44.1 42.0  PLT 198 161    COAGS: No results for input(s): INR, APTT in the last 8760 hours.  BMP: Recent Labs    09/15/20 1609 09/16/20 1336  NA 139  --   K 3.7  --   CL 105  --   CO2 26  --   GLUCOSE 117*  --   BUN 11  --   CALCIUM 9.1  --   CREATININE 0.75 0.82  GFRNONAA >60 >60    LIVER FUNCTION TESTS: No results for input(s): BILITOT, AST, ALT, ALKPHOS, PROT, ALBUMIN in the last 8760 hours.  TUMOR MARKERS: No results for input(s): AFPTM, CEA, CA199, CHROMGRNA in the last 8760 hours.  Assessment and Plan:  60 y/o F with no significant past medical history and not on any medications who presented to 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 She has been admitted to [facility] and NIR has  been consulted for diagnostic cerebral angiogram.  Patient history and imaging reviewed by Dr. Corliss Skains who approves patient for procedure.  Plan: - Diagnostic cerebral arteriogram today in NIR. Patient to remain NPO until post procedure. - Will likely need flow diverter placed pending results of cerebral arteriogram, if this is the case we will plan for this ASAP given patient is symptomatic. - Continue to hold heparin, will determine post arteriogram if this should be restarted - Brilinta 180 mg PO x 1 now, then Brilinta 90 mg BID starting at 2200 tonight. - ASA 81 mg PO now and then QD - Continue medrol taper  NIR will call for patient when ready. Please call with questions or concerns.   Risks and benefits of diagnostic cerebral angiogram were discussed with the patient including, but not limited to bleeding, infection, vascular injury, stroke, or contrast induced renal failure.  This interventional procedure involves the use of X-rays and because of the nature of the planned procedure, it is possible that we will have prolonged use of X-ray fluoroscopy.  Potential radiation risks to you include (but are not limited to) the following: - A slightly elevated risk for cancer  several years later in life. This risk is typically less than 0.5% percent. This risk is low in comparison to the normal incidence of human cancer, which is 33% for women and 50% for men according to the [employer]. - Radiation induced injury can include skin redness, resembling a rash, tissue breakdown / ulcers and hair loss (which can be  temporary or permanent).  The likelihood of either of these occurring depends on the difficulty of the procedure and whether you are sensitive to radiation due to previous procedures, disease, or genetic conditions.  IF your procedure requires a prolonged use of radiation, you will be notified and given written instructions for further action.  It is your responsibility to  monitor the irradiated area for the 2 weeks following the procedure and to notify your physician  if you are concerned that you have suffered a radiation induced injury.    All of the patient's questions were answered, patient is agreeable to proceed.  Consent signed and in chart.   Thank you for this interesting consult.  I greatly enjoyed meeting JASMARIE COPPOCK and look forward to participating in their care.  A copy of this report was sent to the requesting provider on this date.  Electronically Signed: Villa Herb, PA-C 09/17/2020, 9:01 AM   I spent a total of 40 Minutes in face to face in clinical consultation, greater than 50% of which was counseling/coordinating care for diagnostic cerebral angiogram.

## 2020-09-17 NOTE — Sedation Documentation (Signed)
Capnography off at this time per Dr. Corliss Skains

## 2020-09-17 NOTE — Procedures (Signed)
S/P bilateral common carotid and RT Vert arteriograms. Rt Rad approach. Findings. 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  2.ACOM patent.  S. MD

## 2020-09-17 NOTE — Progress Notes (Signed)
This RN called floor RN to get report prior to sending for patients AM diagnostic cerebral angiogram. Per RN, family does not want to proceed at this time. Dr. Conchita Paris not made aware of families decision.

## 2020-09-17 NOTE — Progress Notes (Signed)
RN spoke with Victorino Dike RN this morning and confirmed transfer of treatment team to Dr Corliss Skains.

## 2020-09-18 ENCOUNTER — Inpatient Hospital Stay (HOSPITAL_COMMUNITY): Payer: BC Managed Care – PPO | Admitting: Anesthesiology

## 2020-09-18 ENCOUNTER — Other Ambulatory Visit: Payer: Self-pay | Admitting: Interventional Radiology

## 2020-09-18 ENCOUNTER — Inpatient Hospital Stay (HOSPITAL_COMMUNITY): Payer: BC Managed Care – PPO

## 2020-09-18 ENCOUNTER — Encounter (HOSPITAL_COMMUNITY)
Admission: EM | Disposition: A | Discharge status: Home / Self Care | Payer: Self-pay | Source: Home / Self Care | Attending: Interventional Radiology

## 2020-09-18 ENCOUNTER — Encounter (HOSPITAL_COMMUNITY): Payer: Self-pay | Admitting: Interventional Radiology

## 2020-09-18 DIAGNOSIS — I671 Cerebral aneurysm, nonruptured: Secondary | ICD-10-CM | POA: Insufficient documentation

## 2020-09-18 HISTORY — PX: IR US GUIDE VASC ACCESS RIGHT: IMG2390

## 2020-09-18 HISTORY — PX: IR ANGIO INTRA EXTRACRAN SEL COM CAROTID INNOMINATE BILAT MOD SED: IMG5360

## 2020-09-18 HISTORY — PX: IR 3D INDEPENDENT WKST: IMG2385

## 2020-09-18 HISTORY — PX: IR ANGIO VERTEBRAL SEL VERTEBRAL UNI R MOD SED: IMG5368

## 2020-09-18 HISTORY — PX: IR CT HEAD LTD: IMG2386

## 2020-09-18 HISTORY — PX: IR TRANSCATH/EMBOLIZ: IMG695

## 2020-09-18 HISTORY — PX: RADIOLOGY WITH ANESTHESIA: SHX6223

## 2020-09-18 LAB — BASIC METABOLIC PANEL
Anion gap: 8 (ref 5–15)
BUN: 8 mg/dL (ref 8–23)
CO2: 24 mmol/L (ref 22–32)
Calcium: 8.6 mg/dL — ABNORMAL LOW (ref 8.9–10.3)
Chloride: 105 mmol/L (ref 98–111)
Creatinine, Ser: 0.73 mg/dL (ref 0.44–1.00)
GFR, Estimated: >60 mL/min (ref >60)
Glucose, Bld: 102 mg/dL — ABNORMAL HIGH (ref 70–99)
Potassium: 3.7 mmol/L (ref 3.5–5.1)
Sodium: 137 mmol/L (ref 135–145)

## 2020-09-18 LAB — CBC
HCT: 40.8 % (ref 36.0–46.0)
Hemoglobin: 13.5 g/dL (ref 12.0–15.0)
MCH: 27.1 pg (ref 26.0–34.0)
MCHC: 33.1 g/dL (ref 30.0–36.0)
MCV: 81.9 fL (ref 80.0–100.0)
Platelets: 169 10*3/uL (ref 150–400)
RBC: 4.98 MIL/uL (ref 3.87–5.11)
RDW: 15.1 % (ref 11.5–15.5)
WBC: 8.6 10*3/uL (ref 4.0–10.5)
nRBC: 0 % (ref 0.0–0.2)

## 2020-09-18 LAB — PROTIME-INR
INR: 1.1 (ref 0.8–1.2)
Prothrombin Time: 13.9 seconds (ref 11.4–15.2)

## 2020-09-18 LAB — ABO/RH: ABO/RH(D): O POS

## 2020-09-18 LAB — POCT ACTIVATED CLOTTING TIME
Activated Clotting Time: 179 seconds
Activated Clotting Time: 208 seconds

## 2020-09-18 LAB — TYPE AND SCREEN
ABO/RH(D): O POS
Antibody Screen: NEGATIVE

## 2020-09-18 SURGERY — IR WITH ANESTHESIA
Anesthesia: General

## 2020-09-18 MED ORDER — CEFAZOLIN SODIUM-DEXTROSE 2-4 GM/100ML-% IV SOLN
INTRAVENOUS | Status: AC
  Filled 2020-09-18: qty 100

## 2020-09-18 MED ORDER — FENTANYL CITRATE (PF) 250 MCG/5ML IJ SOLN
INTRAMUSCULAR | Status: AC
  Filled 2020-09-18: qty 5

## 2020-09-18 MED ORDER — PHENYLEPHRINE HCL-NACL 10-0.9 MG/250ML-% IV SOLN
INTRAVENOUS | Status: DC | PRN
  Administered 2020-09-18: 50 ug/min via INTRAVENOUS

## 2020-09-18 MED ORDER — ACETAMINOPHEN 650 MG RE SUPP: 650.0000 mg | RECTAL | Status: DC | PRN

## 2020-09-18 MED ORDER — CLEVIDIPINE BUTYRATE 0.5 MG/ML IV EMUL
INTRAVENOUS | Status: DC | PRN
  Administered 2020-09-18: 1 mg/h via INTRAVENOUS

## 2020-09-18 MED ORDER — LACTATED RINGERS IV SOLN: INTRAVENOUS | Status: DC

## 2020-09-18 MED ORDER — SODIUM CHLORIDE 0.9 % IV SOLN: INTRAVENOUS | Status: DC

## 2020-09-18 MED ORDER — ACETAMINOPHEN 160 MG/5ML PO SOLN: 650.0000 mg | ORAL | Status: DC | PRN

## 2020-09-18 MED ORDER — CHLORHEXIDINE GLUCONATE 0.12 % MT SOLN
OROMUCOSAL | Status: AC
  Administered 2020-09-18: 15 mL
  Filled 2020-09-18: qty 15

## 2020-09-18 MED ORDER — HEPARIN (PORCINE) 25000 UT/250ML-% IV SOLN: 500.0000 [IU]/h | INTRAVENOUS | Status: DC

## 2020-09-18 MED ORDER — FENTANYL CITRATE (PF) 100 MCG/2ML IJ SOLN
INTRAMUSCULAR | Status: DC | PRN
  Administered 2020-09-18: 150 ug via INTRAVENOUS
  Administered 2020-09-18: 100 ug via INTRAVENOUS

## 2020-09-18 MED ORDER — HEPARIN (PORCINE) 25000 UT/250ML-% IV SOLN
800.0000 [IU]/h | INTRAVENOUS | Status: DC
  Administered 2020-09-18: 800 [IU]/h via INTRAVENOUS
  Filled 2020-09-18: qty 250

## 2020-09-18 MED ORDER — HYDROCODONE-ACETAMINOPHEN 5-325 MG PO TABS
1.0000 | ORAL_TABLET | Freq: Four times a day (QID) | ORAL | Status: DC | PRN
  Administered 2020-09-18 – 2020-09-22 (×8): 1 via ORAL
  Filled 2020-09-18 (×8): qty 1

## 2020-09-18 MED ORDER — SUGAMMADEX SODIUM 200 MG/2ML IV SOLN
INTRAVENOUS | Status: DC | PRN
  Administered 2020-09-18: 300 mg via INTRAVENOUS

## 2020-09-18 MED ORDER — CLEVIDIPINE BUTYRATE 0.5 MG/ML IV EMUL
0.0000 mg/h | INTRAVENOUS | Status: AC
Start: 2020-09-18 — End: 2020-09-19
  Administered 2020-09-18: 15 mg/h via INTRAVENOUS
  Administered 2020-09-18: 12 mg/h via INTRAVENOUS
  Administered 2020-09-19: 18 mg/h via INTRAVENOUS
  Administered 2020-09-19 (×2): 21 mg/h via INTRAVENOUS
  Administered 2020-09-19: 16 mg/h via INTRAVENOUS
  Administered 2020-09-19: 21 mg/h via INTRAVENOUS
  Administered 2020-09-19: 18 mg/h via INTRAVENOUS
  Administered 2020-09-19 (×4): 21 mg/h via INTRAVENOUS
  Administered 2020-09-19: 12 mg/h via INTRAVENOUS
  Administered 2020-09-19: 21 mg/h via INTRAVENOUS
  Administered 2020-09-19: 12 mg/h via INTRAVENOUS
  Filled 2020-09-18: qty 50
  Filled 2020-09-18 (×3): qty 100
  Filled 2020-09-18 (×2): qty 50
  Filled 2020-09-18: qty 100
  Filled 2020-09-18 (×2): qty 50
  Filled 2020-09-18 (×2): qty 100
  Filled 2020-09-18: qty 50
  Filled 2020-09-18: qty 100

## 2020-09-18 MED ORDER — ASPIRIN 81 MG PO CHEW
81.0000 mg | CHEWABLE_TABLET | Freq: Every day | ORAL | Status: DC
  Administered 2020-09-19 – 2020-09-22 (×4): 81 mg via ORAL
  Filled 2020-09-18 (×4): qty 1

## 2020-09-18 MED ORDER — ACETAMINOPHEN 325 MG PO TABS
650.0000 mg | ORAL_TABLET | ORAL | Status: DC | PRN
  Administered 2020-09-18 – 2020-09-22 (×7): 650 mg via ORAL
  Filled 2020-09-18 (×2): qty 2

## 2020-09-18 MED ORDER — LACTATED RINGERS IV SOLN: INTRAVENOUS | Status: DC | PRN

## 2020-09-18 MED ORDER — CHLORHEXIDINE GLUCONATE CLOTH 2 % EX PADS
6.0000 | MEDICATED_PAD | Freq: Every day | CUTANEOUS | Status: DC
  Administered 2020-09-18 – 2020-09-22 (×5): 6 via TOPICAL

## 2020-09-18 MED ORDER — LIDOCAINE HCL (CARDIAC) PF 100 MG/5ML IV SOSY
PREFILLED_SYRINGE | INTRAVENOUS | Status: DC | PRN
  Administered 2020-09-18: 30 mg via INTRAVENOUS

## 2020-09-18 MED ORDER — METHYLPREDNISOLONE 4 MG PO TBPK
4.0000 mg | ORAL_TABLET | Freq: Four times a day (QID) | ORAL | Status: AC
  Administered 2020-09-18 – 2020-09-22 (×8): 4 mg via ORAL
  Filled 2020-09-18: qty 21

## 2020-09-18 MED ORDER — TICAGRELOR 90 MG PO TABS: 90.0000 mg | ORAL_TABLET | Freq: Two times a day (BID) | ORAL | Status: DC

## 2020-09-18 MED ORDER — ASPIRIN 81 MG PO CHEW: 81.0000 mg | CHEWABLE_TABLET | Freq: Every day | ORAL | Status: DC

## 2020-09-18 MED ORDER — MIDAZOLAM HCL 2 MG/2ML IJ SOLN
INTRAMUSCULAR | Status: AC
  Filled 2020-09-18: qty 2

## 2020-09-18 MED ORDER — ROCURONIUM BROMIDE 10 MG/ML (PF) SYRINGE
PREFILLED_SYRINGE | INTRAVENOUS | Status: DC | PRN
  Administered 2020-09-18: 20 mg via INTRAVENOUS
  Administered 2020-09-18: 60 mg via INTRAVENOUS
  Administered 2020-09-18: 10 mg via INTRAVENOUS

## 2020-09-18 MED ORDER — CLEVIDIPINE BUTYRATE 0.5 MG/ML IV EMUL
INTRAVENOUS | Status: AC
  Administered 2020-09-18: 12 mg/h via INTRAVENOUS
  Filled 2020-09-18: qty 50

## 2020-09-18 MED ORDER — NITROGLYCERIN 1 MG/10 ML FOR IR/CATH LAB
INTRA_ARTERIAL | Status: AC
  Filled 2020-09-18: qty 10

## 2020-09-18 MED ORDER — TICAGRELOR 90 MG PO TABS
90.0000 mg | ORAL_TABLET | Freq: Two times a day (BID) | ORAL | Status: DC
  Administered 2020-09-18 – 2020-09-22 (×8): 90 mg via ORAL
  Filled 2020-09-18 (×8): qty 1

## 2020-09-18 MED ORDER — CEFAZOLIN SODIUM-DEXTROSE 2-3 GM-%(50ML) IV SOLR
INTRAVENOUS | Status: DC | PRN
  Administered 2020-09-18: 2 g via INTRAVENOUS

## 2020-09-18 MED ORDER — HEPARIN SODIUM (PORCINE) 1000 UNIT/ML IJ SOLN
INTRAMUSCULAR | Status: DC | PRN
  Administered 2020-09-18: 3000 [IU] via INTRAVENOUS
  Administered 2020-09-18: 1000 [IU] via INTRAVENOUS

## 2020-09-18 MED ORDER — PROPOFOL 10 MG/ML IV BOLUS
INTRAVENOUS | Status: DC | PRN
  Administered 2020-09-18: 150 mg via INTRAVENOUS

## 2020-09-18 MED ORDER — CEFAZOLIN SODIUM-DEXTROSE 2-4 GM/100ML-% IV SOLN
2.0000 g | Freq: Once | INTRAVENOUS | Status: DC
  Filled 2020-09-18: qty 100

## 2020-09-18 MED ORDER — IOHEXOL 240 MG/ML SOLN
INTRAMUSCULAR | Status: AC
  Filled 2020-09-18: qty 200

## 2020-09-18 MED ORDER — IOHEXOL 240 MG/ML SOLN
150.0000 mL | Freq: Once | INTRAMUSCULAR | Status: AC | PRN
  Administered 2020-09-18: 90 mL via INTRAVENOUS

## 2020-09-18 MED ORDER — IOHEXOL 240 MG/ML SOLN
150.0000 mL | Freq: Once | INTRAMUSCULAR | Status: AC | PRN
  Administered 2020-09-18: 75 mL via INTRAVENOUS

## 2020-09-18 MED ORDER — "THROMBI-PAD 3""X3"" EX PADS"
1.0000 | MEDICATED_PAD | Freq: Once | CUTANEOUS | Status: AC
  Administered 2020-09-18: 1 via TOPICAL
  Filled 2020-09-18: qty 1

## 2020-09-18 MED ORDER — SODIUM CHLORIDE 0.9 % IV SOLN: INTRAVENOUS | Status: DC | PRN

## 2020-09-18 MED ORDER — KETOROLAC TROMETHAMINE 30 MG/ML IJ SOLN
30.0000 mg | Freq: Four times a day (QID) | INTRAMUSCULAR | Status: AC
  Administered 2020-09-18 – 2020-09-20 (×8): 30 mg via INTRAVENOUS
  Filled 2020-09-18 (×8): qty 1

## 2020-09-18 NOTE — Progress Notes (Signed)
Upon arrival to ICU, daughter confirmed that all patient belongings were brought over from [facility].

## 2020-09-18 NOTE — Anesthesia Procedure Notes (Signed)
Procedure Name: Intubation Date/Time: 09/18/2020 12:52 PM Performed by: Eligha Bridegroom, CRNA Pre-anesthesia Checklist: Patient identified, Emergency Drugs available, Suction available, Patient being monitored and Timeout performed Patient Re-evaluated:Patient Re-evaluated prior to induction Oxygen Delivery Method: Circle system utilized Preoxygenation: Pre-oxygenation with 100% oxygen Induction Type: IV induction Ventilation: Mask ventilation without difficulty and Oral airway inserted - appropriate to patient size Laryngoscope Size: Mac and 3 Grade View: Grade II Tube type: Oral Tube size: 7.0 mm Number of attempts: 1 Airway Equipment and Method: Stylet Placement Confirmation: ETT inserted through vocal cords under direct vision, positive ETCO2 and breath sounds checked- equal and bilateral Secured at: 21 cm Tube secured with: Tape Dental Injury: Teeth and Oropharynx as per pre-operative assessment

## 2020-09-18 NOTE — Anesthesia Preprocedure Evaluation (Signed)
Anesthesia Evaluation  Patient identified by MRN, date of birth, ID band Patient awake  General Assessment Comment:Sleepy, Oriented X 3 answers all questions appropriately  Reviewed: Allergy & Precautions, NPO status , Patient's Chart, lab work & pertinent test results  Airway Mallampati: II  TM Distance: >3 FB Neck ROM: Full    Dental  (+) Teeth Intact, Dental Advisory Given   Pulmonary    breath sounds clear to auscultation       Cardiovascular  Rhythm:Regular Rate:Normal     Neuro/Psych    GI/Hepatic   Endo/Other    Renal/GU      Musculoskeletal   Abdominal   Peds  Hematology   Anesthesia Other Findings   Reproductive/Obstetrics                             Anesthesia Physical Anesthesia Plan  ASA: 3  Anesthesia Plan: General   Post-op Pain Management:    Induction: Intravenous  PONV Risk Score and Plan: Ondansetron and Dexamethasone  Airway Management Planned: Oral ETT  Additional Equipment: Arterial line  Intra-op Plan:   Post-operative Plan: Extubation in OR  Informed Consent: I have reviewed the patients History and Physical, chart, labs and discussed the procedure including the risks, benefits and alternatives for the proposed anesthesia with the patient or authorized representative who has indicated his/her understanding and acceptance.     Dental advisory given  Plan Discussed with: CRNA and Anesthesiologist  Anesthesia Plan Comments:         Anesthesia Quick Evaluation

## 2020-09-18 NOTE — H&P (Signed)
Patient Status: [facility] - In-pt  Assessment and Plan: Giant cavernous left ICA aneurysm Patient s/p diagnostic angiogram yesterday which showed 47mm x41mm partially thrombosed left ICA cavernous aneurysm with secondary direct CC fistula decompressing into the sup ophthalmic veins and retropharyngeal venous plexus. Patient with clinical improvement today, reporting improvement in blurry vision.  Headache stable 8/10, receiving pain medication PRN. Sclera clearing.  Some tearing.  Reports numbness over the left frontal scalp.  Plan for embolization with flow diverter placement today in IR.   Patient and daughter updated at bedside alongside Dr. Corliss Skains.  All questions answered.   Risks and benefits of cerebral angiogram with intervention were discussed with the patient including, but not limited to bleeding, infection, vascular injury, contrast induced renal failure, stroke or even death.  This interventional procedure involves the use of X-rays and because of the nature of the planned procedure, it is possible that we will have prolonged use of X-ray fluoroscopy.  Potential radiation risks to you include (but are not limited to) the following: - A slightly elevated risk for cancer  several years later in life. This risk is typically less than 0.5% percent. This risk is low in comparison to the normal incidence of human cancer, which is 33% for women and 50% for men according to the [employer]. - Radiation induced injury can include skin redness, resembling a rash, tissue breakdown / ulcers and hair loss (which can be temporary or permanent).   The likelihood of either of these occurring depends on the difficulty of the procedure and whether you are sensitive to radiation due to previous procedures, disease, or genetic conditions.   IF your procedure requires a prolonged use of radiation, you will be notified and given written instructions for further action.  It is your  responsibility to monitor the irradiated area for the 2 weeks following the procedure and to notify your physician if you are concerned that you have suffered a radiation induced injury.    All of the patient's questions were answered, patient is agreeable to proceed.  Consent signed and in chart.  ______________________________________________________________________   History of Present Illness: Rose Marquez is a 60 y.o. female with no significant past medical history who presented with left facial and left eye pain, left forehead numbness, nausea and double vision x2 days. Angiogram 6/23 showed a giant cavernous left ICA aneurysm as well as asymmetric prominence and filling of the left superior orbital vein, unable to exclude CC fistula.  Allergies and medications reviewed.   Review of Systems: A 12 point ROS discussed and pertinent positives are indicated in the HPI above.  All other systems are negative.  Review of Systems  Constitutional:  Negative for fatigue and fever.  Respiratory:  Negative for cough and shortness of breath.   Cardiovascular:  Negative for chest pain.  Gastrointestinal:  Negative for abdominal pain.  Musculoskeletal:  Negative for back pain.  Neurological:  Positive for numbness and headaches. Negative for facial asymmetry.  Psychiatric/Behavioral:  Negative for behavioral problems and confusion.    Vital Signs: BP (!) 146/86 (BP Location: Left Arm)   Pulse (!) 52   Temp 99.9 F (37.7 C) (Oral)   Resp 14   Ht 5\' 7"  (1.702 m)   Wt 202 lb (91.6 kg)   SpO2 97%   BMI 31.64 kg/m   Physical Exam Vitals and nursing note reviewed.  Constitutional:      General: She is not in acute distress.  Appearance: Normal appearance. She is not ill-appearing.  HENT:     Mouth/Throat:     Mouth: Mucous membranes are moist.     Pharynx: Oropharynx is clear.  Pulmonary:     Effort: Pulmonary effort is normal.     Breath sounds: Normal breath sounds.  Skin:     General: Skin is warm and dry.  Neurological:     General: No focal deficit present.     Mental Status: She is alert and oriented to person, place, and time. Mental status is at baseline.     Comments: Alert, oriented, left lateral rectus weakness, diplopia with lateral left gaze, sclera clearing, numbness over left frontal scalp.  Psychiatric:        Mood and Affect: Mood normal.        Behavior: Behavior normal.        Thought Content: Thought content normal.        Judgment: Judgment normal.     Imaging reviewed.   Labs:  COAGS: Recent Labs    09/18/20 0436  INR 1.1    BMP: Recent Labs    09/15/20 1609 09/16/20 1336 09/18/20 0436  NA 139  --  137  K 3.7  --  3.7  CL 105  --  105  CO2 26  --  24  GLUCOSE 117*  --  102*  BUN 11  --  8  CALCIUM 9.1  --  8.6*  CREATININE 0.75 0.82 0.73  GFRNONAA >60 >60 >60       Electronically Signed: Hoyt Koch, PA 09/18/2020, 9:48 AM   I spent a total of 15 minutes in face to face in clinical consultation, greater than 50% of which was counseling/coordinating care for venous access.

## 2020-09-18 NOTE — Progress Notes (Signed)
Patient ID: ARANZA GEDDES, female   DOB: Apr 01, 1960, 60 y.o.   MRN: [id number] INR. C/O  an 8/10 Lt  supraorbital pain partially throbbing with lt fronto temporal numbness.  Denies any N/V  .  Diplopia on Lt lateral gaze but not on RT lateral gaze.  Able to see clearly with the Rt eye. Lt  eye vision remains blurred. Also C/O intermittent tearing on the Lt.No other neurological complaints.  O/E  Alert awake O 3 . Speech and comp WNLs. Pupils RT 82mm Lt 2 to 3 mm sluggishly reactive to to light.  Decreased puffiness of the Lt upper eyelid C/W yesterday.  Lt eye chemosis less prominent.  EOMS . Decreased abduction of Lt eye on left horizontal gaze with objective diplopia. No facial asymmetry. Tongue midline. Motor ,coordination WNLs..  Angiographic findings reviewed with patient and daughter.  They were informed that the patients symptoms were due to rupture of the giant Lt ICA cavernous aneurysm into the Lt cavernous sinus resulting in increased pressure in the veins draining into the Lt orbit.  Endovascular treatment with flow diversion stent(s) and/or coiling was  discussed extensively. Risks of a thromboembolic stroke,and ICH were reviewed.  They were also informed that a second (staged ) procedure may be needed depending on the first treatment. The patient  verbalized complete understanding and provided consent to the treatment.  S. MD

## 2020-09-18 NOTE — Transfer of Care (Signed)
Immediate Anesthesia Transfer of Care Note  Patient: Rose Marquez  Procedure(s) Performed: IR WITH ANESTHESIA ANEURYSM EMBOLIZATION  Patient Location: ICU  Anesthesia Type:General  Level of Consciousness: awake, alert  and oriented  Airway & Oxygen Therapy: Patient Spontanous Breathing and Patient connected to face mask oxygen  Post-op Assessment: Report given to RN and Post -op Vital signs reviewed and stable  Post vital signs: Reviewed and stable  Last Vitals:  Vitals Value Taken Time  BP 130/83 09/18/20 1707  Temp    Pulse 77 09/18/20 1712  Resp 15 09/18/20 1712  SpO2 90 % 09/18/20 1712  Vitals shown include unvalidated device data.  Last Pain:  Vitals:   09/18/20 1139  TempSrc:   PainSc: 6       Patients Stated Pain Goal: 3 (09/18/20 1139)  Complications: No notable events documented.

## 2020-09-18 NOTE — Sedation Documentation (Signed)
Pt transported to 4N30 via stretcher accompanied by RN and CRNA. Helmut Muster RN at bedside to receive pt. Handoff completed. Right groin remains level 0 with bilateral lower distal pulses palpable. Pt responds appropriately and follows commands. No s/s of distress at this time.

## 2020-09-18 NOTE — Progress Notes (Signed)
ANTICOAGULATION CONSULT NOTE - Follow Up Consult  Pharmacy Consult for Heparin Indication: Dr. Corliss Skains procedure  No Known Allergies  Patient Measurements: Height: 5\' 7"  (170.2 cm) Weight: 91.6 kg (202 lb) IBW/kg (Calculated) : 61.6   Vital Signs: Temp: 99.7 F (37.6 C) (06/24 1130) Temp Source: Oral (06/24 1130) BP: 195/93 (06/24 1145) Pulse Rate: 54 (06/24 1145)  Labs: Recent Labs    09/16/20 1336 09/18/20 0436  HGB 13.9 13.5  HCT 42.0 40.8  PLT 161 169  LABPROT  --  13.9  INR  --  1.1  CREATININE 0.82 0.73    Estimated Creatinine Clearance: 84.7 mL/min (by C-G formula based on SCr of 0.73 mg/dL).   Assessment: 60 year old female to begin heparin post neuro procedure x 24 hours  Goal of Therapy:  Heparin level 0.1-0.25 units/ml Monitor platelets by anticoagulation protocol: Yes   Plan:  Heparin to 800 units / hr Heparin level in 8 hours Daily heparin level, CBC  Thank you 05-18-1974, PharmD 09/18/2020,5:42 PM

## 2020-09-18 NOTE — Procedures (Signed)
INR . S/P Lt common carotid arteriogram RT CFA approach.  S/P embolization of giant Lt ICA cav aneurysm and CC FISTULA with x2 pipline shield flow diverters. Post CT brain neg for hemorrhage. Extubated.  Distal pulses present bilaterally.81F angioseal for hemostasis at RT groin puncture site. Pupils 33mm RT = Lt  sluggish . Denies any H/As,N /V . Follows simple commands appropriately. Moves all 4s equally. S. MD

## 2020-09-18 NOTE — Sedation Documentation (Signed)
8 fr angioseal deployed 

## 2020-09-19 LAB — CBC WITH DIFFERENTIAL/PLATELET
Abs Immature Granulocytes: 0.06 10*3/uL (ref 0.00–0.07)
Basophils Absolute: 0 10*3/uL (ref 0.0–0.1)
Basophils Relative: 0 %
Eosinophils Absolute: 0 10*3/uL (ref 0.0–0.5)
Eosinophils Relative: 0 %
HCT: 35.8 % — ABNORMAL LOW (ref 36.0–46.0)
Hemoglobin: 12 g/dL (ref 12.0–15.0)
Immature Granulocytes: 1 %
Lymphocytes Relative: 7 %
Lymphs Abs: 0.8 10*3/uL (ref 0.7–4.0)
MCH: 27.5 pg (ref 26.0–34.0)
MCHC: 33.5 g/dL (ref 30.0–36.0)
MCV: 81.9 fL (ref 80.0–100.0)
Monocytes Absolute: 0.6 10*3/uL (ref 0.1–1.0)
Monocytes Relative: 5 %
Neutro Abs: 10.1 10*3/uL — ABNORMAL HIGH (ref 1.7–7.7)
Neutrophils Relative %: 87 %
Platelets: 165 10*3/uL (ref 150–400)
RBC: 4.37 MIL/uL (ref 3.87–5.11)
RDW: 15.3 % (ref 11.5–15.5)
WBC: 11.6 10*3/uL — ABNORMAL HIGH (ref 4.0–10.5)
nRBC: 0 % (ref 0.0–0.2)

## 2020-09-19 LAB — BASIC METABOLIC PANEL
Anion gap: 7 (ref 5–15)
BUN: 7 mg/dL — ABNORMAL LOW (ref 8–23)
CO2: 22 mmol/L (ref 22–32)
Calcium: 8.2 mg/dL — ABNORMAL LOW (ref 8.9–10.3)
Chloride: 106 mmol/L (ref 98–111)
Creatinine, Ser: 0.69 mg/dL (ref 0.44–1.00)
GFR, Estimated: >60 mL/min (ref >60)
Glucose, Bld: 125 mg/dL — ABNORMAL HIGH (ref 70–99)
Potassium: 3.5 mmol/L (ref 3.5–5.1)
Sodium: 135 mmol/L (ref 135–145)

## 2020-09-19 LAB — HEPARIN LEVEL (UNFRACTIONATED): Heparin Unfractionated: 0.25 IU/mL — ABNORMAL LOW (ref 0.30–0.70)

## 2020-09-19 MED ORDER — AMLODIPINE BESYLATE 5 MG PO TABS
5.0000 mg | ORAL_TABLET | Freq: Every day | ORAL | Status: DC
  Administered 2020-09-19 – 2020-09-21 (×3): 5 mg via ORAL
  Filled 2020-09-19 (×3): qty 1

## 2020-09-19 MED ORDER — CLEVIDIPINE BUTYRATE 0.5 MG/ML IV EMUL
0.0000 mg/h | INTRAVENOUS | Status: DC
  Administered 2020-09-19: 18 mg/h via INTRAVENOUS
  Administered 2020-09-19: 21 mg/h via INTRAVENOUS
  Administered 2020-09-19 (×2): 18 mg/h via INTRAVENOUS
  Administered 2020-09-19: 21 mg/h via INTRAVENOUS
  Administered 2020-09-20 (×2): 18 mg/h via INTRAVENOUS
  Administered 2020-09-20: 17 mg/h via INTRAVENOUS
  Administered 2020-09-20: 18 mg/h via INTRAVENOUS
  Administered 2020-09-20: 16 mg/h via INTRAVENOUS
  Administered 2020-09-20: 18 mg/h via INTRAVENOUS
  Administered 2020-09-21: 2 mg/h via INTRAVENOUS
  Administered 2020-09-21: 1 mg/h via INTRAVENOUS
  Filled 2020-09-19 (×2): qty 100
  Filled 2020-09-19: qty 150
  Filled 2020-09-19 (×3): qty 50

## 2020-09-19 MED ORDER — HEPARIN (PORCINE) 25000 UT/250ML-% IV SOLN
800.0000 [IU]/h | INTRAVENOUS | Status: AC
  Filled 2020-09-19: qty 250

## 2020-09-19 NOTE — Progress Notes (Signed)
Patient called out. This RN went into patient's room. Patient stated "I think I moved my leg". This RN assessed right groin puncture site. Patient bleeding from site. This RN immediately held pressure and called for assistance from others via unit hand held radios. Charge nurse paged and spoke with Dr. Corliss Skains. Orders to stop heparin drip for one hour, and for thombipads. Heparin stopped. Pressure held until bleeding ceased, and then thrombipad and new dressing applied. Vital signs stable throughout. Right lower extremity distal pulses present, capillary refill less than three seconds, and patient's foot remained warm throughout. Will continue to monitor.

## 2020-09-19 NOTE — Progress Notes (Signed)
Per lab, platelet inhibition p2y12 needs to be recollected on Monday, 09/21/20 and sent to [facility] due to equipment issues in the lab.

## 2020-09-19 NOTE — Progress Notes (Signed)
ANTICOAGULATION CONSULT NOTE - Follow Up Consult  Pharmacy Consult for heparin Indication:  common carotid arteriogram and embolization of ICA aneurysm    Labs: Recent Labs    09/16/20 1336 09/18/20 0436 09/19/20 0211  HGB 13.9 13.5 12.0  HCT 42.0 40.8 35.8*  PLT 161 169 165  LABPROT  --  13.9  --   INR  --  1.1  --   HEPARINUNFRC  --   --  0.25*  CREATININE 0.82 0.73 0.69    Assessment/Plan:  60yo female therapeutic on heparin with initial dosing s/p IR. Will continue gtt at current rate of 800 units/hr until off in am.   Vernard Gambles, PharmD, BCPS  09/19/2020,3:51 AM

## 2020-09-19 NOTE — Progress Notes (Signed)
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 Corliss Skains.  Overnight events noted. Patient did have episode of bleeding from groin with hemostasis achieved by holding pressure.  She is alert, oriented, lying flat in bed this AM.  She has been receiving scheduled toradol. One dose of breakthrough pain med overnight.  BP 120-140s on Cleviprex.  Has not yet received morning meds.   Allergies: Patient has no known allergies.  Medications: Prior to Admission medications   Medication Sig Start Date End Date Taking? Authorizing Provider  Multiple Vitamin (MULTIVITAMIN WITH MINERALS) TABS tablet Take 1 tablet by mouth daily.   Yes [provider]     Vital Signs: BP 105/63   Pulse 71   Temp 99.3 F (37.4 C) (Oral)   Resp (!) 21   Ht 5\' 7"  (1.702 m)   Wt 202 lb (91.6 kg)   SpO2 95%   BMI 31.64 kg/m   Physical Exam NAD, alert. Neuro:  PERRL, incomplete left lateral rectus abduction today.  Mild left ptosis.  Numbness over the left frontal scalp.  Speech intelligible, no trouble swallowing meds. Subconjunctival hemorrhage stable.  No tearing observed today.  No weakness in extremities.  Moving all limbs spontaneously.  Groin: soft, non-tender to palpation.  No active bleeding noted.  Thrombipad removed.  Dressing replaced.  Pulses: right DP identified by doppler.    Imaging: CT Head Wo Contrast  Result Date: 09/15/2020 CLINICAL DATA:  Headache EXAM: CT HEAD WITHOUT CONTRAST TECHNIQUE: Contiguous axial images were obtained from the base of the skull through the vertex without intravenous contrast. COMPARISON:  None. FINDINGS: Brain: No acute territorial infarction or hemorrhage is visualized. Extra-axial mass adjacent to the medial left temporal lobe, appears to be associated with cavernous sinus. This measures 19 mm AP by 22 mm  transverse. Peripheral high density within the lesion. Mild mass effect on adjacent temporal lobe. Ventricles nonenlarged Vascular: No unexpected calcification. Skull: No fracture Sinuses/Orbits: Sinuses are clear.  The globes appear intact. Other: None IMPRESSION: 19 x 22 mm left cavernous sinus lesion which could represent a mass or giant aneurysm. Contrast-enhanced MRI is recommended for further evaluation. Electronically Signed   By: 09/17/2020 M.D.   On: 09/15/2020 23:02   MR ANGIO HEAD WO CONTRAST  Result Date: 09/16/2020 CLINICAL DATA:  Initial evaluation for acute headache, left-sided visual disturbance. EXAM: MRI HEAD WITHOUT AND WITH CONTRAST MRA HEAD WITHOUT CONTRAST TECHNIQUE: Multiplanar, multi-echo pulse sequences of the brain and surrounding structures were acquired without and with intravenous contrast. Angiographic images of the Circle of Willis were acquired using MRA technique without intravenous contrast. CONTRAST:  9.45mL GADAVIST GADOBUTROL 1 MMOL/ML IV SOLN COMPARISON:  Prior CTs from 09/15/2020. FINDINGS: MRI HEAD FINDINGS Brain: Cerebral volume within normal limits. Scattered patchy T2/FLAIR hyperintensity within the periventricular deep white matter both cerebral hemispheres most consistent with chronic small vessel ischemic disease, mild in nature. No evidence for acute or subacute infarct. Gray-white matter differentiation maintained. No encephalomalacia to suggest chronic cortical infarction. No acute or chronic intracranial hemorrhage. Previously identified lesion centered at the left cavernous sinus again seen. Lesion measures 2.2 x 2.3 x 1.8 cm (AP by transverse by craniocaudad). Lesion demonstrates heterogeneous T2 signal intensity with a prominent peripheral flow void. Nonenhancing central T2 hypointense component measures up to 2.1 cm. Associated susceptibility artifact suggesting blood products. Following contrast administration, enhancement seen throughout the  surrounding  left cavernous sinus and likely within the periphery of this lesion, with no enhancement centrally. Finding felt to be most consistent with a large cavernous left ICA aneurysm with prominent central thrombosed component. Secondary mass effect on the adjacent pituitary gland and pituitary stalk which are deviated to the right (series 32, image 8). Supraclinoid left ICA displaced superiorly, contacting the undersurface of the left optic chiasm (series 32, image 9). Asymmetric prominence of the left superior ophthalmic vein suggests a degree of associated venous congestion related to compression from this lesion. No definite cavernous sinus thrombosis. No edema within the adjacent left temporal lobe. No other mass lesion, mass effect, or midline shift. Slight asymmetry of the lateral ventricles without hydrocephalus. No extra-axial fluid collection. No other abnormal enhancement. Vascular: Large cavernous left ICA aneurysm as above. Major intracranial vascular flow voids maintained. Skull and upper cervical spine: Craniocervical junction within normal limits. Bone marrow signal intensity normal. No scalp soft tissue abnormality. Sinuses/Orbits: Asymmetric prominence of the left superior orbital vein suggestive of venous congestion due to compression from the left ICA aneurysm. Possible CC fistula difficult to exclude. Globes and orbital soft tissues demonstrate no other acute finding. No more than mild mucosal thickening noted within the sphenoethmoidal and maxillary sinuses. No significant mastoid effusion. Inner ear structures grossly normal. Other: None. MRA HEAD FINDINGS Anterior circulation: Visualized distal cervical segments of the internal carotid arteries are patent with antegrade flow. Petrous segments patent bilaterally. Scattered atheromatous irregularity within the cavernous and supraclinoid right ICA without significant stenosis. On the left, probable large cavernous left ICA aneurysm is seen, measuring  approximately 1.9 x 2.4 x 1.8 cm on this portion of the exam. Probable partial thrombosis better appreciated on corresponding brain MRI. There is filling and flow related signal seen extending into the left superior orbital veins (series 1097, image 4). While this could be related to passive congestion from compression by the large aneurysm, a possible fistulous connection (CC fistula) may also be present. Supraclinoid left ICA widely patent distally to the terminus. A1 segments patent bilaterally. Normal anterior communicating artery complex. Anterior cerebral arteries patent to their distal aspects without stenosis. No M1 stenosis or occlusion. Normal MCA bifurcations. MCA branches well perfused and symmetric. Diffuse small vessel atheromatous irregularity noted. Posterior circulation: Both V4 segments patent to the vertebrobasilar junction without stenosis. Left vertebral artery slightly dominant. Right PICA origin patent and normal. Left PICA not seen. Basilar patent to its distal aspect without stenosis. Superior cerebellar arteries patent bilaterally. Both PCAs primarily supplied via the basilar, although a small left posterior communicating artery noted. PCAs perfused to their distal aspects without significant stenosis. Anatomic variants: None significant. IMPRESSION: MRI HEAD IMPRESSION: 1. 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 Localized mass effect as above. 2. Asymmetric prominence and filling of the left superior orbital vein. While this finding may be secondary to compression by the large aneurysm with associated passive congestion, a possible fistulous component (CC fistula) is difficult to exclude, and may be present as well. Further assessment with dedicated catheter directed arteriogram suggested for definitive characterization. 3. No other acute intracranial abnormality. 4. Underlying  mild chronic microvascular ischemic disease for age. MRA HEAD IMPRESSION: 1. Large cavernous left ICA aneurysm as above. 2. No other large vessel occlusion. No proximal high-grade or correctable stenosis. 3. Diffuse small vessel atheromatous irregularity. Electronically Signed   By: Sharlet Salina  Phill MyronMcClintock M.D.   On: 09/16/2020 03:52   MR Brain W and Wo Contrast  Result Date: 09/16/2020 CLINICAL DATA:  Initial evaluation for acute headache, left-sided visual disturbance. EXAM: MRI HEAD WITHOUT AND WITH CONTRAST MRA HEAD WITHOUT CONTRAST TECHNIQUE: Multiplanar, multi-echo pulse sequences of the brain and surrounding structures were acquired without and with intravenous contrast. Angiographic images of the Circle of Willis were acquired using MRA technique without intravenous contrast. CONTRAST:  9.495mL GADAVIST GADOBUTROL 1 MMOL/ML IV SOLN COMPARISON:  Prior CTs from 09/15/2020. FINDINGS: MRI HEAD FINDINGS Brain: Cerebral volume within normal limits. Scattered patchy T2/FLAIR hyperintensity within the periventricular deep white matter both cerebral hemispheres most consistent with chronic small vessel ischemic disease, mild in nature. No evidence for acute or subacute infarct. Gray-white matter differentiation maintained. No encephalomalacia to suggest chronic cortical infarction. No acute or chronic intracranial hemorrhage. Previously identified lesion centered at the left cavernous sinus again seen. Lesion measures 2.2 x 2.3 x 1.8 cm (AP by transverse by craniocaudad). Lesion demonstrates heterogeneous T2 signal intensity with a prominent peripheral flow void. Nonenhancing central T2 hypointense component measures up to 2.1 cm. Associated susceptibility artifact suggesting blood products. Following contrast administration, enhancement seen throughout the surrounding left cavernous sinus and likely within the periphery of this lesion, with no enhancement centrally. Finding felt to be most consistent with a large  cavernous left ICA aneurysm with prominent central thrombosed component. Secondary mass effect on the adjacent pituitary gland and pituitary stalk which are deviated to the right (series 32, image 8). Supraclinoid left ICA displaced superiorly, contacting the undersurface of the left optic chiasm (series 32, image 9). Asymmetric prominence of the left superior ophthalmic vein suggests a degree of associated venous congestion related to compression from this lesion. No definite cavernous sinus thrombosis. No edema within the adjacent left temporal lobe. No other mass lesion, mass effect, or midline shift. Slight asymmetry of the lateral ventricles without hydrocephalus. No extra-axial fluid collection. No other abnormal enhancement. Vascular: Large cavernous left ICA aneurysm as above. Major intracranial vascular flow voids maintained. Skull and upper cervical spine: Craniocervical junction within normal limits. Bone marrow signal intensity normal. No scalp soft tissue abnormality. Sinuses/Orbits: Asymmetric prominence of the left superior orbital vein suggestive of venous congestion due to compression from the left ICA aneurysm. Possible CC fistula difficult to exclude. Globes and orbital soft tissues demonstrate no other acute finding. No more than mild mucosal thickening noted within the sphenoethmoidal and maxillary sinuses. No significant mastoid effusion. Inner ear structures grossly normal. Other: None. MRA HEAD FINDINGS Anterior circulation: Visualized distal cervical segments of the internal carotid arteries are patent with antegrade flow. Petrous segments patent bilaterally. Scattered atheromatous irregularity within the cavernous and supraclinoid right ICA without significant stenosis. On the left, probable large cavernous left ICA aneurysm is seen, measuring approximately 1.9 x 2.4 x 1.8 cm on this portion of the exam. Probable partial thrombosis better appreciated on corresponding brain MRI. There is  filling and flow related signal seen extending into the left superior orbital veins (series 1097, image 4). While this could be related to passive congestion from compression by the large aneurysm, a possible fistulous connection (CC fistula) may also be present. Supraclinoid left ICA widely patent distally to the terminus. A1 segments patent bilaterally. Normal anterior communicating artery complex. Anterior cerebral arteries patent to their distal aspects without stenosis. No M1 stenosis or occlusion. Normal MCA bifurcations. MCA branches well perfused and symmetric. Diffuse small vessel atheromatous irregularity noted. Posterior circulation: Both V4 segments  patent to the vertebrobasilar junction without stenosis. Left vertebral artery slightly dominant. Right PICA origin patent and normal. Left PICA not seen. Basilar patent to its distal aspect without stenosis. Superior cerebellar arteries patent bilaterally. Both PCAs primarily supplied via the basilar, although a small left posterior communicating artery noted. PCAs perfused to their distal aspects without significant stenosis. Anatomic variants: None significant. IMPRESSION: MRI HEAD IMPRESSION: 1. 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 Localized mass effect as above. 2. Asymmetric prominence and filling of the left superior orbital vein. While this finding may be secondary to compression by the large aneurysm with associated passive congestion, a possible fistulous component (CC fistula) is difficult to exclude, and may be present as well. Further assessment with dedicated catheter directed arteriogram suggested for definitive characterization. 3. No other acute intracranial abnormality. 4. Underlying mild chronic microvascular ischemic disease for age. MRA HEAD IMPRESSION: 1. Large cavernous left ICA aneurysm as above. 2. No other large vessel  occlusion. No proximal high-grade or correctable stenosis. 3. Diffuse small vessel atheromatous irregularity. Electronically Signed   By: Rise Mu M.D.   On: 09/16/2020 03:52   CT Orbits W Contrast  Result Date: 09/15/2020 CLINICAL DATA:  Initial evaluation for anisocoria, left periorbital discomfort/numbness, pain with left lateral gaze. EXAM: CT ORBITS WITH CONTRAST TECHNIQUE: Multidetector CT images was performed according to the standard protocol following intravenous contrast administration. CONTRAST:  28mL OMNIPAQUE IOHEXOL 300 MG/ML  SOLN COMPARISON:  Concomitant CT of the head. FINDINGS: Orbits: Globes are symmetric in size with normal appearance and morphology. Optic nerves symmetric and normal. Extra-ocular muscles symmetric and within normal limits. Lacrimal glands within normal limits. Intraconal and extraconal fat well-maintained. Prominence of the superior orbital veins seen bilaterally, left greater than right. 19 x 22 mm irregular hypodensity/filling defect seen centered at the left cavernous sinus (series 2, image 11). Abnormality extends medially to involve the sella at the midline. Opacification/filling of the cavernous sinus seen around this lesion. Associated mild osseous remodeling at the adjacent skull base. Findings indeterminate, and could reflect a mass versus large partially thrombosed aneurysm. Visible paranasal sinuses: Mild scattered mucosal thickening noted within the ethmoidal air cells, maxillary, and sphenoid sinuses. No air-fluid levels to suggest acute sinusitis. Mastoid air cells and middle ear cavities are well pneumatized and free of fluid. Sinuses. Soft tissues: Periorbital soft tissues demonstrate no acute finding. Osseous: Visualized osseous structures demonstrate no acute finding. Few periapical lucencies noted about the visualized teeth. Limited intracranial: Remainder of the visualized brain unremarkable. IMPRESSION: 1. 19 x 22 mm irregular  hypodensity/filling defect centered at the left cavernous sinus. Finding is indeterminate, and could reflect a mass versus large partially thrombosed aneurysm. Further evaluation with dedicated brain MRI, with and without contrast recommended. A pituitary protocol MRI would likely be helpful. Corresponding MRA of the circle-of-Willis is also recommended for further evaluation. 2. Otherwise negative CT of the orbits. Electronically Signed   By: Rise Mu M.D.   On: 09/15/2020 23:33    Labs:  CBC: Recent Labs    09/15/20 1609 09/16/20 1336 09/18/20 0436 09/19/20 0211  WBC 11.6* 10.3 8.6 11.6*  HGB 14.3 13.9 13.5 12.0  HCT 44.1 42.0 40.8 35.8*  PLT 198 161 169 165    COAGS: Recent Labs    09/18/20 0436  INR 1.1    BMP: Recent Labs    09/15/20 1609 09/16/20 1336  09/18/20 0436 09/19/20 0211  NA 139  --  137 135  K 3.7  --  3.7 3.5  CL 105  --  105 106  CO2 26  --  24 22  GLUCOSE 117*  --  102* 125*  BUN 11  --  8 7*  CALCIUM 9.1  --  8.6* 8.2*  CREATININE 0.75 0.82 0.73 0.69  GFRNONAA >60 >60 >60 >60    LIVER FUNCTION TESTS: No results for input(s): BILITOT, AST, ALT, ALKPHOS, PROT, ALBUMIN in the last 8760 hours.  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 Corliss Skains this AM.  She is alert, conversant. Reports headache less today-- 7/10 today.  Has been receiving scheduled toradol.  Increased Cleviprex need.  Reports increased BP at home PTA, has not been seen by PCP.  Per Dr. Corliss Skains given 5mg  amlodipine today.  Ok to stop heparin.  No further episodes of groin bleeding.  Site intact without oozing today. Dressing replaced.  Neuro exam overall stable-- mild ptosis, incomplete left lateral rectus abduction, reports diplopia.  Ok to resume aspirin and Brilinta.  Getting steroid dose pack.  May elevate HOB and advance diet slowly today.  Obtain P2Y12.      Electronically Signed: , PA 09/19/2020, 3:47 PM   I spent a total of 25 Minutes at the the patient's bedside AND on the patient's hospital floor or unit, greater than 50% of which was counseling/coordinating care for large cavernous left ICA aneurysm.

## 2020-09-20 ENCOUNTER — Encounter (HOSPITAL_COMMUNITY): Payer: Self-pay | Admitting: Interventional Radiology

## 2020-09-20 LAB — TRIGLYCERIDES: Triglycerides: 81 mg/dL (ref <150)

## 2020-09-20 MED ORDER — LABETALOL HCL 5 MG/ML IV SOLN
5.0000 mg | INTRAVENOUS | Status: DC | PRN
  Administered 2020-09-21: 5 mg via INTRAVENOUS
  Filled 2020-09-20: qty 4

## 2020-09-20 NOTE — Evaluation (Signed)
Physical Therapy Evaluation Patient Details Name: Rose Marquez MRN: [id number] DOB: Apr 09, 1960 Today's Date: 09/20/2020   History of Present Illness  Pt is a 60 y.o. female who presented 6/21 with a L-sided headache, double vision, L facial numbness, and retro-orbital pain that began 2 days PTA. 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 S/p bilateral common carotid and RT Vert arteriograms 6/23. S/p S/P Lt common carotid arteriogram and embolization of giant Lt ICA cav aneurysm and CC FISTULA with x2 pipline shield flow diverters 6/24. No pertinent PMH.   Clinical Impression  Pt presents with condition above and deficits mentioned below, see PT Problem List. PTA, she was independent, working as a IT trainer, and living alone in a house that has a fully equipped basement with level entry. Currently, pt displays symmetrical and intact bil UE and lower extremity strength, sensation, and coordination. However, pt displays balance deficits and gait deviations that place her at risk for falls. Pt needed min guard-minA to ambulate without UE support this date due to intermittent bouts of LOB or bumping into objects on the L side. Suspect that her visual deficits may be contributing to her balance deficits. Pt with poor AROM abduction of her L eye and has double vision in her L visual field. Expect pt's balance and mobility will improve as her vision is addressed. Will continue to follow acutely.    Follow Up Recommendations No PT follow up;Supervision for mobility/OOB    Equipment Recommendations  None recommended by PT    Recommendations for Other Services       Precautions / Restrictions Precautions Precautions: Fall Precaution Comments: diplopia in L visual field Restrictions Weight Bearing Restrictions: No      Mobility  Bed Mobility Overal bed mobility: Independent              General bed mobility comments: Pt able to perform all bed mobility safely in appropriate amount of time with bed flat and rails down.    Transfers Overall transfer level: Needs assistance Equipment used: None Transfers: Sit to/from Stand Sit to Stand: Min guard         General transfer comment: Min guard for safety, no LOB.  Ambulation/Gait Ambulation/Gait assistance: Min guard;Min assist Gait Distance (Feet): 90 Feet (x2 bouts of ~60 ft > ~90 ft) Assistive device: None Gait Pattern/deviations: Step-through pattern;Decreased stance time - left;Decreased step length - right;Decreased dorsiflexion - right;Shuffle;Trunk flexed Gait velocity: reduced Gait velocity interpretation: 1.31 - 2.62 ft/sec, indicative of limited community ambulator General Gait Details: Poor posture and intermittent minor LOB, needing min guard-minA to recover. Pt bumping into obstacles on the L intermittently. Decreased R ankle dorsiflexion and R step length and L stance time.  Stairs            Wheelchair Mobility    Modified Rankin (Stroke Patients Only) Modified Rankin (Stroke Patients Only) Pre-Morbid Rankin Score: No symptoms Modified Rankin: Moderately severe disability     Balance Overall balance assessment: Needs assistance Sitting-balance support: No upper extremity supported;Feet supported Sitting balance-Leahy Scale: Good     Standing balance support: No upper extremity supported;During functional activity Standing balance-Leahy Scale: Fair Standing balance comment: Able to ambulate without UE support but displays trunk sway and intermittent LOB.  Pertinent Vitals/Pain Pain Assessment: 0-10 Pain Score: 4  Pain Location: L-sided headache, medial chest with sitting Pain Descriptors / Indicators: Discomfort;Grimacing Pain Intervention(s): Limited activity within patient's tolerance;Monitored during session;Repositioned    Home  Living Family/patient expects to be discharged to:: Private residence Living Arrangements: Alone Available Help at Discharge: Family (daughter works from home and can stay with pt) Type of Home: House Home Access: Stairs to enter Entrance Stairs-Rails: Right (ascending) Entrance Stairs-Number of Steps: 5 (level entry at basement) Home Layout: One level (basement is fully furnished with kitchen also) Home Equipment: None      Prior Function Level of Independence: Independent         Comments: Pt is a IT trainer. Pt drives.     Hand Dominance   Dominant Hand: Right    Extremity/Trunk Assessment   Upper Extremity Assessment Upper Extremity Assessment: Overall WFL for tasks assessed (bil MMT grossly 5, denies numbness/tingling bil, coordination intact bil)    Lower Extremity Assessment Lower Extremity Assessment: Overall WFL for tasks assessed (bil MMT grossly 5, denies numbness/tingling bil, coordination intact bil)    Cervical / Trunk Assessment Cervical / Trunk Assessment: Normal  Communication   Communication: No difficulties  Cognition Arousal/Alertness: Awake/alert Behavior During Therapy: WFL for tasks assessed/performed Overall Cognitive Status: Within Functional Limits for tasks assessed                                 General Comments: Daughter noted pt has been a little slower to process and respond, but unsure if it is due to pain and not feeling well. A&Ox4.      General Comments General comments (skin integrity, edema, etc.): multiform PVCs on monitor, pt reporting medial chest pain with sitting up that improved with standing and supine, RN notified; decreased L eye abduction AROM with double vision noted in L visual field    Exercises     Assessment/Plan    PT Assessment Patient needs continued PT services  PT Problem List Decreased strength;Decreased range of motion;Decreased activity tolerance;Decreased balance;Decreased mobility;Decreased  coordination;Decreased safety awareness;Pain       PT Treatment Interventions DME instruction;Gait training;Stair training;Functional mobility training;Therapeutic activities;Therapeutic exercise;Balance training;Neuromuscular re-education;Patient/family education    PT Goals (Current goals can be found in the Care Plan section)  Acute Rehab PT Goals Patient Stated Goal: to get better PT Goal Formulation: With patient/family Time For Goal Achievement: 10/04/20 Potential to Achieve Goals: Good    Frequency Min 4X/week   Barriers to discharge        Co-evaluation               AM-PAC PT "6 Clicks" Mobility  Outcome Measure Help needed turning from your back to your side while in a flat bed without using bedrails?: None Help needed moving from lying on your back to sitting on the side of a flat bed without using bedrails?: None Help needed moving to and from a bed to a chair (including a wheelchair)?: A Little Help needed standing up from a chair using your arms (e.g., wheelchair or bedside chair)?: A Little Help needed to walk in hospital room?: A Little Help needed climbing 3-5 steps with a railing? : A Little 6 Click Score: 20    End of Session Equipment Utilized During Treatment: Gait belt Activity Tolerance: Patient tolerated treatment well Patient left: in bed;with call bell/phone within reach;with bed alarm set;with family/visitor present Nurse Communication: Mobility status;Other (  comment) (chest pain and PVCs read on monitor) PT Visit Diagnosis: Unsteadiness on feet (R26.81);Other abnormalities of gait and mobility (R26.89);Difficulty in walking, not elsewhere classified (R26.2)    Time: 3810-1751 PT Time Calculation (min) (ACUTE ONLY): 32 min   Charges:   PT Evaluation $PT Eval Moderate Complexity: 1 Mod PT Treatments $Therapeutic Activity: 8-22 mins        Raymond Gurney, PT, DPT Acute Rehabilitation Services  Pager: [phone] Office:  [phone]   Jewel Baize 09/20/2020, 4:01 PM

## 2020-09-20 NOTE — Progress Notes (Signed)
VO from Dr. Corliss Skains for SBP less than 150.

## 2020-09-20 NOTE — Progress Notes (Signed)
Supervising Physician: Julieanne Cotton  Patient Status:  [facility] - In-pt  Chief Complaint: Large cavernous left ICA aneurysm  Subjective: Patient assessed at bedside alongside Dr. Corliss Skains.  NAEON.  She is alert, oriented, lying flat in bed this AM.  She has been receiving scheduled toradol. One dose of breakthrough pain med overnight.  Rates headache as 3/10 this AM.  SBP 120s on Cleviprex. A-line discontinued overnight.   Has not yet received morning meds.   Allergies: Patient has no known allergies.  Medications: Prior to Admission medications   Medication Sig Start Date End Date Taking? Authorizing Provider  Multiple Vitamin (MULTIVITAMIN WITH MINERALS) TABS tablet Take 1 tablet by mouth daily.   Yes [provider]     Vital Signs: BP 118/77 (BP Location: Left Arm)   Pulse 73   Temp 97.9 F (36.6 C) (Axillary)   Resp 14   Ht 5\' 7"  (1.702 m)   Wt 202 lb (91.6 kg)   SpO2 95%   BMI 31.64 kg/m   Physical Exam NAD, alert. Neuro:  R pupil slightly > L this AM, incomplete left lateral rectus abduction.  Stable left ptosis.  Numbness over the left frontal scalp which she describes as "dispersing".  Speech intelligible, no trouble swallowing meds. Subconjunctival hemorrhage stable.  No tearing observed today.  Moving all limbs spontaneously, no weakness noted.  Groin: soft, non-tender to palpation.  No active bleeding noted.    Imaging: No results found.  Labs:  CBC: Recent Labs    09/15/20 1609 09/16/20 1336 09/18/20 0436 09/19/20 0211  WBC 11.6* 10.3 8.6 11.6*  HGB 14.3 13.9 13.5 12.0  HCT 44.1 42.0 40.8 35.8*  PLT 198 161 169 165     COAGS: Recent Labs    09/18/20 0436  INR 1.1     BMP: Recent Labs    09/15/20 1609 09/16/20 1336 09/18/20 0436 09/19/20 0211  NA 139  --  137 135  K 3.7  --  3.7 3.5  CL 105  --  105 106  CO2 26  --  24 22  GLUCOSE 117*  --  102* 125*  BUN 11  --  8 7*  CALCIUM 9.1  --  8.6* 8.2*  CREATININE  0.75 0.82 0.73 0.69  GFRNONAA >60 >60 >60 >60     LIVER FUNCTION TESTS: No results for input(s): BILITOT, AST, ALT, ALKPHOS, PROT, ALBUMIN in the last 8760 hours.  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 09/21/20 this AM.  -She is alert, conversant.  -Neuro exam overall stable-- mild ptosis, incomplete left lateral rectus abduction, reports diplopia.  -Ok to continue aspirin and Brilinta.  -P2Y12 not possible today per lab-- planning to obtain and send out tomorrow (6/27).  -Continue steroid dose pack.  Ongoing headache as expected: -Reports headache less today-- 3/10 today.  -Has been receiving scheduled toradol. Took 1 dose of breakthrough Norco overnight.  Continue current interventions.   Remains hypertensive: -OngoingCleviprex need.  Reports increased BP at home PTA, has not been seen by PCP.  -Per Dr. 5/10 given 5mg  amlodipine daily. -Will also add PRN labetolol to facilitate Cleviprex wean.  -Permissive SBP to 150.  -D/C IVF  Groin stable:  -No further episodes of groin bleeding.  Site intact without oozing today. Dressing stable. -D/C foley.  -May get OOB to chair. -Initiate PT/OT today vs. Tomorrow pending availability.      Electronically  Signed: Hoyt Koch, PA 09/20/2020, 9:12 AM   I spent a total of 25 Minutes at the the patient's bedside AND on the patient's hospital floor or unit, greater than 50% of which was counseling/coordinating care for large cavernous left ICA aneurysm.

## 2020-09-20 NOTE — Progress Notes (Signed)
Left radial arterial line noted to have dampened waveform; still pulls back blood return but pt reports burning feeling when flushed with normal saline and RN unable to re-zero and get arterial tracing on monitor despite multiple troubleshooting attempts with another RN. Arterial line removed, pressure held on site and dry gauze dressing placed over site. Pulse +3, cap refill remains <3 secs and NIBP will be used instead.

## 2020-09-20 NOTE — Progress Notes (Signed)
Pt complains of new medial chest pain radiating to left jaw following PT session. 12 lead EKG obtained. Contacted Dr. Melchor Amour who advised to monitor and as long as the pain improves with rest no new orders. If pain worsens, she will consult cardiology.

## 2020-09-21 ENCOUNTER — Inpatient Hospital Stay (HOSPITAL_COMMUNITY): Payer: BC Managed Care – PPO

## 2020-09-21 DIAGNOSIS — R9431 Abnormal electrocardiogram [ECG] [EKG]: Secondary | ICD-10-CM

## 2020-09-21 DIAGNOSIS — I671 Cerebral aneurysm, nonruptured: Principal | ICD-10-CM

## 2020-09-21 LAB — ECHOCARDIOGRAM COMPLETE
AR max vel: 2.85 cm2
AV Area VTI: 2.65 cm2
AV Area mean vel: 2.57 cm2
AV Mean grad: 6 mmHg
AV Peak grad: 10.2 mmHg
Ao pk vel: 1.6 m/s
Area-P 1/2: 4.49 cm2
Height: 67 in
S' Lateral: 3.2 cm
Weight: 3232 oz

## 2020-09-21 MED ORDER — AMLODIPINE BESYLATE 10 MG PO TABS
10.0000 mg | ORAL_TABLET | Freq: Every day | ORAL | Status: DC
  Administered 2020-09-22: 10 mg via ORAL
  Filled 2020-09-21: qty 1

## 2020-09-21 MED ORDER — HYDROCHLOROTHIAZIDE 25 MG PO TABS: 25.0000 mg | ORAL_TABLET | Freq: Two times a day (BID) | ORAL | Status: DC

## 2020-09-21 MED ORDER — HYDROCHLOROTHIAZIDE 25 MG PO TABS
25.0000 mg | ORAL_TABLET | Freq: Two times a day (BID) | ORAL | Status: DC
  Administered 2020-09-21 – 2020-09-22 (×2): 25 mg via ORAL
  Filled 2020-09-21 (×2): qty 1

## 2020-09-21 NOTE — Consult Note (Addendum)
NAME:  Rose Marquez, MRN:  [id number], DOB:  Apr 28, 1960, LOS: 4 ADMISSION DATE:  09/15/2020, CONSULTATION DATE:  6/27 REFERRING MD: Corliss Skains CHIEF COMPLAINT:  BP management and critical care medicine   History of Present Illness:  60 year old previously healthy female presented to the [facility] ER on 6/22 w/ cc: 2d h/o left facial and left eye pain, forehead numbness and double vision.  -Dx eval concerning for giant left carotid aneurysm compressing the left cavernous sinus;  transferred to [facility] for angiogram. Underwent cerebral angiogram 6/23; found 50mm x 20 mm partially thrombosed Left ICA cavernous aneurysm abd CC fistula  extending into the left cavernous sinus causing the increased pressure on left orbit.  On 6/24 underwent embolization of Left ICA aneurysm and CC fistula CT brain f/u was neg for bleed. Extubated.  6/27 PCCM asked to assist for BP management   Pertinent  Medical History  No PMH other than tobacco abuse  Significant Hospital Events: Including procedures, antibiotic start and stop dates in addition to other pertinent events   6/22 w/ cc: 2d h/o left facial and left eye pain, forehead numbness and double vision.  -Dx eval concerning for giant left carotid aneurysm compressing the left cavernous sinus;  transferred to [facility] for angiogram. Underwent cerebral angiogram 6/23; found 58mm x 20 mm partially thrombosed Left ICA cavernous aneurysm abd CC fistula  extending into the left cavernous sinus causing the increased pressure on left orbit.  On 6/24 underwent embolization of Left ICA aneurysm and CC fistula CT brain f/u was neg for bleed. Extubated.  6/24-6/27 recovering in ICU for BP and pain management  6/27 PCCM asked to assist for BP management   Interim History / Subjective:  No HA currently   Objective   Blood pressure (Abnormal) 158/85, pulse (Abnormal) 52, temperature 98.4 F (36.9 C), temperature source Oral, resp. rate 14, height 5\' 7"  (1.702 m), weight 91.6 kg,  SpO2 92 %.        Intake/Output Summary (Last 24 hours) at 09/21/2020 09/23/2020 Last data filed at 09/21/2020 0800 Gross per 24 hour  Intake 19.85 ml  Output no documentation  Net 19.85 ml   Filed Weights   09/15/20 1536  Weight: 91.6 kg    Examination: general: well nourished , not in acute distress, appears stated age, conversant , calm, and sitting in chair HENT: Normocephalic, without obvious abnormality, Sclerae anicteric, PERRL, and otherleft sided ptosis , mouth: mucous membranes moist, neck: no JVD lungs: normal respiratory effort, not labored, and clear to ascultation  Heart regular rate and rhythm Rhythm: regular Soft, non-tender, normal bowel sounds; no bruits, organomegaly or masses. Skin: normal normal strength, tone, and muscle mass, no deformities, no erythema, induration, or nodules, ROM of all joints is normal there are no suspicious lesions or rashes of concern mental status, speech normal, alert and oriented x3, PERLA, III,VII: ptosis left, muscle tone and strength normal and symmetric, and reflexes normal and symmetric Voids spont    Resolved Hospital Problem list    Assessment & Plan:  Hypertension  -suspect that this was present prior to her admission just not diagnosed.  Plan SBP goal < 150  Increase Norvac to 10mg   Add HCTZ 25mg  bid  Cont Cleviprex w/ hope that we can wean to off after adding above  ECHO today   Left sided HA 2/2 Left ICA aneurysm w/ Carotic Cavernous Fistula (POA) s/p embolization on 6/24 by Neuro- IR Plan BP control (<150) as above  Pain control  for HA  Serial neuro checks Brillinta bid and asa daily F/u CT head imaging tbd by primary team   Isolated episode of CP  Nothing obvious on 12 lead Plan ECHO today   Minimal leukocytosis  Plan  Trend    Best Practice (right click and "Reselect all SmartList Selections" daily)   Diet/type: Regular consistency (see orders) Pain/Anxiety/Delirium protocol Not indicated VAP  protocol (if indicated): Not indicated DVT prophylaxis: other brilinta and asa  GI prophylaxis: N/A Glucose control:  not indicated Lines: N/A Foley:  N/A Culture data pending:none  Antibiotics:ancef Antibiotic de-escalation: stop date ordered.  Code Status:  full code Last date of multidisciplinary goals of care discussion [to be address] Disposition: keep in ICU for BP management    Labs   CBC: Recent Labs  Lab 09/15/20 1609 09/16/20 1336 09/18/20 0436 09/19/20 0211  WBC 11.6* 10.3 8.6 11.6*  NEUTROABS 9.1*  --   --  10.1*  HGB 14.3 13.9 13.5 12.0  HCT 44.1 42.0 40.8 35.8*  MCV 84.5 82.7 81.9 81.9  PLT 198 161 169 165    Basic Metabolic Panel: Recent Labs  Lab 09/15/20 1609 09/16/20 1336 09/18/20 0436 09/19/20 0211  NA 139  --  137 135  K 3.7  --  3.7 3.5  CL 105  --  105 106  CO2 26  --  24 22  GLUCOSE 117*  --  102* 125*  BUN 11  --  8 7*  CREATININE 0.75 0.82 0.73 0.69  CALCIUM 9.1  --  8.6* 8.2*   GFR: Estimated Creatinine Clearance: 84.7 mL/min (by C-G formula based on SCr of 0.69 mg/dL). Recent Labs  Lab 09/15/20 1609 09/16/20 1336 09/18/20 0436 09/19/20 0211  WBC 11.6* 10.3 8.6 11.6*    Liver Function Tests: No results for input(s): AST, ALT, ALKPHOS, BILITOT, PROT, ALBUMIN in the last 168 hours. No results for input(s): LIPASE, AMYLASE in the last 168 hours. No results for input(s): AMMONIA in the last 168 hours.  ABG No results found for: PHART, PCO2ART, PO2ART, HCO3, TCO2, ACIDBASEDEF, O2SAT   Coagulation Profile: Recent Labs  Lab 09/18/20 0436  INR 1.1    Cardiac Enzymes: No results for input(s): CKTOTAL, CKMB, CKMBINDEX, TROPONINI in the last 168 hours.  HbA1C: No results found for: HGBA1C  CBG: No results for input(s): GLUCAP in the last 168 hours.  Review of Systems:   Review of Systems  Constitutional:  Negative for chills, fever, malaise/fatigue and weight loss.  Eyes:  Positive for double vision and pain.   Respiratory: Negative.    Cardiovascular: Negative.   Gastrointestinal: Negative.   Genitourinary: Negative.   Musculoskeletal: Negative.   Skin: Negative.   Neurological: Negative.   Endo/Heme/Allergies: Negative.   Psychiatric/Behavioral: Negative.      Past Medical History:  She,  has no past medical history on file.   Surgical History:   Past Surgical History:  Procedure Laterality Date   RADIOLOGY WITH ANESTHESIA N/A 09/18/2020   Procedure: IR WITH ANESTHESIA ANEURYSM EMBOLIZATION;  Surgeon: Julieanne Cotton, MD;  Location: [facility] OR;  Service: Radiology;  Laterality: N/A;     Social History:   reports that she has never smoked. She has never used smokeless tobacco. She reports that she does not drink alcohol and does not use drugs.   Family History:  Her family history includes Alzheimer's disease in her father.   Allergies No Known Allergies   Home Medications  Prior to Admission medications   Medication Sig Start  Date End Date Taking? Authorizing Provider  Multiple Vitamin (MULTIVITAMIN WITH MINERALS) TABS tablet Take 1 tablet by mouth daily.   Yes [provider]     Critical care time: NA    Simonne Martinet ACNP-BC [facility] Pulmonary/Critical Care Pager # [phone] OR # [phone] if no answer

## 2020-09-21 NOTE — Progress Notes (Signed)
Physical Therapy Treatment Patient Details Name: Rose Marquez MRN: [id number] DOB: Jul 01, 1960 Today's Date: 09/21/2020    History of Present Illness Pt is a 60 y.o. female who presented 6/21 with a L-sided headache, double vision, L facial numbness, and retro-orbital pain that began 2 days PTA. 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 S/p bilateral common carotid and RT Vert arteriograms 6/23. S/p S/P Lt common carotid arteriogram and embolization of giant Lt ICA cav aneurysm and CC FISTULA with x2 pipline shield flow diverters 6/24. No pertinent PMH.    PT Comments    Pt progressing well towards her physical therapy goals. With left eye occluded glasses donned, able to ambulate unit with no assistive device and negotiate 2 steps with a right railing. Able to participate in high level balance activities I.e. stepping over obstacles, head turns, pivot turns without gross loss of balance. Will continue to follow acutely, but no PT follow up anticipated.     Follow Up Recommendations  No PT follow up;Supervision for mobility/OOB     Equipment Recommendations  None recommended by PT    Recommendations for Other Services       Precautions / Restrictions Precautions Precautions: Fall Precaution Comments: diplopia in L visual field, occluded glasses Restrictions Weight Bearing Restrictions: No    Mobility  Bed Mobility               General bed mobility comments: OOB in chair    Transfers Overall transfer level: Independent Equipment used: None                Ambulation/Gait Ambulation/Gait assistance: Min Emergency planning/management officer (Feet): 250 Feet Assistive device: None Gait Pattern/deviations: Step-through pattern;Trunk flexed;Decreased stride length Gait velocity: reduced   General Gait Details: Slow and cautious gait, tendency  for downward gaze. able to perform balance activities with compensation (i.e. slowing down), but no overt LOB. Supervision-min guard for safety   Stairs Stairs: Yes Stairs assistance: Supervision Stair Management: One rail Right Number of Stairs: 2     Wheelchair Mobility    Modified Rankin (Stroke Patients Only) Modified Rankin (Stroke Patients Only) Pre-Morbid Rankin Score: No symptoms Modified Rankin: Moderately severe disability     Balance Overall balance assessment: Needs assistance Sitting-balance support: No upper extremity supported;Feet supported Sitting balance-Leahy Scale: Good     Standing balance support: No upper extremity supported;During functional activity Standing balance-Leahy Scale: Fair                              Cognition Arousal/Alertness: Awake/alert Behavior During Therapy: WFL for tasks assessed/performed Overall Cognitive Status: Within Functional Limits for tasks assessed                                        Exercises      General Comments        Pertinent Vitals/Pain Pain Assessment: No/denies pain    Home Living                      Prior Function            PT Goals (current goals can now be found in the care plan section) Acute Rehab PT Goals Patient  Stated Goal: to get better PT Goal Formulation: With patient/family Time For Goal Achievement: 10/04/20 Potential to Achieve Goals: Good Progress towards PT goals: Progressing toward goals    Frequency    Min 4X/week      PT Plan Current plan remains appropriate    Co-evaluation              AM-PAC PT "6 Clicks" Mobility   Outcome Measure  Help needed turning from your back to your side while in a flat bed without using bedrails?: None Help needed moving from lying on your back to sitting on the side of a flat bed without using bedrails?: None Help needed moving to and from a bed to a chair (including a  wheelchair)?: None Help needed standing up from a chair using your arms (e.g., wheelchair or bedside chair)?: None Help needed to walk in hospital room?: A Little Help needed climbing 3-5 steps with a railing? : A Little 6 Click Score: 22    End of Session Equipment Utilized During Treatment: Gait belt Activity Tolerance: Patient tolerated treatment well Patient left: with call bell/phone within reach;with family/visitor present;in chair;with chair alarm set Nurse Communication: Mobility status PT Visit Diagnosis: Unsteadiness on feet (R26.81);Other abnormalities of gait and mobility (R26.89);Difficulty in walking, not elsewhere classified (R26.2)     Time: 7673-4193 PT Time Calculation (min) (ACUTE ONLY): 16 min  Charges:  $Therapeutic Activity: 8-22 mins                     Lillia Pauls, PT, DPT Acute Rehabilitation Services Pager [phone] Office [phone]    Norval Morton 09/21/2020, 12:59 PM

## 2020-09-21 NOTE — Plan of Care (Signed)
  Problem: Clinical Measurements: Goal: Ability to maintain clinical measurements within normal limits will improve Outcome: Progressing Goal: Will remain free from infection Outcome: Progressing Goal: Diagnostic test results will improve Outcome: Progressing Goal: Cardiovascular complication will be avoided Outcome: Progressing   Problem: Activity: Goal: Risk for activity intolerance will decrease Outcome: Progressing   Problem: Nutrition: Goal: Adequate nutrition will be maintained Outcome: Progressing   Problem: Coping: Goal: Level of anxiety will decrease Outcome: Completed/Met   Problem: Elimination: Goal: Will not experience complications related to bowel motility Outcome: Completed/Met Goal: Will not experience complications related to urinary retention Outcome: Completed/Met

## 2020-09-21 NOTE — Progress Notes (Signed)
NIR.  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 Corliss Skains.  Patient evaluated bedside this afternoon alongside Dr. Corliss Skains. Patient awake and alert laying in bed watching TV. Daughter and Tresa Endo, RN at bedside. States headache significantly improved since yesterday (per RN no Norco PO taken since seen earlier this AM). No additional complaints. Tolerating PO intake. Still on Cleviprex IV- turned off during encounter.  Plan to stay in neuro ICU another night. Continue taking Brilinta 90 mg twice daily and Aspirin 81 mg once daily- P2Y12 pending. Will obtain repeat CT head (without contrast) once BP controlled.  SBP goal <150. CCM consulted- recommended medical adjustments (increase Norvasc PO dose, add HTCZ PO, wean off Cleviprex IV, echo pending)- appreciate recs.  Left-sided headache significantly improved- continue Norco 5-325 PO Q6 PRN headache.  NIR to follow.   Waylan Boga , PA-C 09/21/2020, 3:23 PM

## 2020-09-21 NOTE — Discharge Instructions (Signed)
Femoral Site Care This sheet gives you information about how to care for yourself after your procedure. Your health care provider may also give you more specific instructions. If you have problems or questions, contact your health care provider. What can I expect after the procedure? After the procedure, it is common to have: Bruising that usually fades within 1-2 weeks. Tenderness at the site. Follow these instructions at home: Wound care Follow instructions from your health care provider about how to take care of your insertion site. Make sure you: Wash your hands with soap and water before you change your bandage (dressing). If soap and water are not available, use hand sanitizer. Change your dressing as directed- pressure dressing removed 24 hours post-procedure (and switch for bandaid), bandaid removed 72 hours post-procedure Do not take baths, swim, or use a hot tub for 7 days post-procedure. You may shower 48 hours after the procedure or as told by your health care provider. Gently wash the site with plain soap and water. Pat the area dry with a clean towel. Do not rub the site. This may cause bleeding. Check your site every day for signs of infection. Check for: Redness, swelling, or pain. Fluid or blood. Warmth. Pus or a bad smell. Activity Do not stoop, bend, or lift anything that is heavier than 10 lb (4.5 kg) for 2 weeks post-procedure. Do not drive self for 2 weeks post-procedure. Contact a health care provider if you have: A fever or chills. You have redness, swelling, or pain around your insertion site. Get help right away if: The catheter insertion area swells very fast. You pass out. You suddenly start to sweat or your skin gets clammy. The catheter insertion area is bleeding, and the bleeding does not stop when you hold steady pressure on the area. The area near or just beyond the catheter insertion site becomes pale, cool, tingly, or numb. These symptoms may  represent a serious problem that is an emergency. Do not wait to see if the symptoms will go away. Get medical help right away. Call your local emergency services (911 in the [place] Do not drive yourself to the hospital.  This information is not intended to replace advice given to you by your health care provider. Make sure you discuss any questions you have with your health care provider. Document Revised: 03/27/2017 Document Reviewed: 03/27/2017 Elsevier Patient Education  2020 [employer]  

## 2020-09-21 NOTE — Evaluation (Signed)
Occupational Therapy Evaluation Patient Details Name: Rose Marquez MRN: [id number] DOB: Nov 10, 1960 Today's Date: 09/21/2020    History of Present Illness Pt is a 60 y.o. female who presented 6/21 with a L-sided headache, double vision, L facial numbness, and retro-orbital pain that began 2 days PTA. 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 S/p bilateral common carotid and RT Vert arteriograms 6/23. S/p S/P Lt common carotid arteriogram and embolization of giant Lt ICA cav aneurysm and CC FISTULA with x2 pipline shield flow diverters 6/24. No pertinent PMH.   Clinical Impression   PT admitted with L ICA aneurysm. Pt currently with functional limitiations due to the deficits listed below (see OT problem list). Pt currently with diplopia that affects all adls and with increased min guard (A) using occluded vision taping of glasses. Pt needs cues to leave glasses on during task.  Pt will benefit from skilled OT to increase their independence and safety with adls and balance to allow discharge outpatient vision.     Follow Up Recommendations  Outpatient OT (vision)    Equipment Recommendations  None recommended by OT    Recommendations for Other Services       Precautions / Restrictions Precautions Precautions: Fall Precaution Comments: diplopia reported Restrictions Weight Bearing Restrictions: No      Mobility Bed Mobility               General bed mobility comments: OOB in chair    Transfers Overall transfer level: Needs assistance Equipment used: None                  Balance Overall balance assessment: Needs assistance Sitting-balance support: No upper extremity supported;Feet supported Sitting balance-Leahy Scale: Good     Standing balance support: No upper extremity supported;During functional activity Standing balance-Leahy  Scale: Fair                             ADL either performed or assessed with clinical judgement   ADL Overall ADL's : Needs assistance/impaired   Eating/Feeding Details (indicate cue type and reason): noted to have more than 6 cranberry juice containers Grooming: Min guard;Standing                   Toilet Transfer: Min guard;Ambulation;Regular Social worker and Hygiene: Min guard       Functional mobility during ADLs: Min guard General ADL Comments: self reports balance is not baseline and feeling off. requires cues to put glasses back on multiple times during session     Vision Baseline Vision/History: Wears glasses Wears Glasses: Reading only Patient Visual Report: Diplopia Vision Assessment?: Vision impaired- to be further tested in functional context Additional Comments: occlusion of L lens provided to glasses. pt reports not change in vision with reader on vs off. pt agreeable to occlusion on reader glasses present. provided handout with activities and explained to pt/ daughter in room     Perception     Praxis      Pertinent Vitals/Pain Pain Assessment: No/denies pain     Hand Dominance Right   Extremity/Trunk Assessment Upper Extremity Assessment Upper Extremity Assessment: Overall WFL for tasks assessed   Lower Extremity Assessment Lower Extremity Assessment: Defer to PT evaluation   Cervical / Trunk Assessment Cervical / Trunk Assessment: Normal  Communication Communication Communication: No difficulties   Cognition Arousal/Alertness: Awake/alert Behavior During Therapy: WFL for tasks assessed/performed Overall Cognitive Status: Impaired/Different from baseline Area of Impairment: Safety/judgement                         Safety/Judgement: Decreased awareness of deficits     General Comments: lacks awareness to diplopia and need to wear glasses. pt keeps saying I only need to wear them to  read. pt educated during session on diplopia occlusion of glasses for depth perception and need to help with balance. pt reports that R eye sees better but is closing it and leaving L eye open.   General Comments  VSS    Exercises     Shoulder Instructions      Home Living Family/patient expects to be discharged to:: Private residence Living Arrangements: Alone Available Help at Discharge: Family Type of Home: House Home Access: Stairs to enter Secretary/administrator of Steps: 5 Entrance Stairs-Rails: Right Home Layout: One level     Bathroom Shower/Tub: Chief Strategy Officer: Handicapped height     Home Equipment: None          Prior Functioning/Environment Level of Independence: Independent        Comments: Pt is a IT trainer. Pt drives.        OT Problem List: Impaired vision/perception      OT Treatment/Interventions: Self-care/ADL training;Therapeutic exercise;DME and/or AE instruction;Therapeutic activities;Visual/perceptual remediation/compensation;Patient/family education;Balance training    OT Goals(Current goals can be found in the care plan section) Acute Rehab OT Goals Patient Stated Goal: none directly stated OT Goal Formulation: With patient Time For Goal Achievement: 10/05/20 Potential to Achieve Goals: Good  OT Frequency: Min 2X/week   Barriers to D/C:            Co-evaluation              AM-PAC OT "6 Clicks" Daily Activity     Outcome Measure Help from another person eating meals?: None Help from another person taking care of personal grooming?: None Help from another person toileting, which includes using toliet, bedpan, or urinal?: A Little Help from another person bathing (including washing, rinsing, drying)?: A Little Help from another person to put on and taking off regular upper body clothing?: None Help from another person to put on and taking off regular lower body clothing?: A Little 6 Click Score: 21   End of  Session Nurse Communication: Mobility status;Precautions  Activity Tolerance: Patient tolerated treatment well Patient left: in chair;with call bell/phone within reach;with family/visitor present  OT Visit Diagnosis: Unsteadiness on feet (R26.81)                Time: 8341-9622 OT Time Calculation (min): 16 min Charges:  OT General Charges $OT Visit: 1 Visit OT Evaluation $OT Eval Moderate Complexity: 1 Mod   Brynn, OTR/L  Acute Rehabilitation Services Pager: [phone] Office: [phone] .   Mateo Flow 09/21/2020, 3:23 PM

## 2020-09-21 NOTE — Progress Notes (Addendum)
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 Corliss Skains. Uncontrolled hypertension. Left-sided headaches.  Subjective:  Patient awake and alert laying in bed. Daughter and Tresa Endo, RN at bedside. States headache significantly improved with Norco PO. Does have associated left eye left gaze palsy and left ptosis, however improving per patient. States no additional events of chest pain bedsides 1 episode yesterday, EKG without cause of chest pain. Right femoral puncture site c/d/i. BP currently within limits (140s/80s) however on Cleviprex IV.   Allergies: Patient has no known allergies.  Medications: Prior to Admission medications   Medication Sig Start Date End Date Taking? Authorizing Provider  Multiple Vitamin (MULTIVITAMIN WITH MINERALS) TABS tablet Take 1 tablet by mouth daily.   Yes [provider]     Vital Signs: BP (!) 158/85   Pulse (!) 52   Temp 98.4 F (36.9 C) (Oral)   Resp 14   Ht 5\' 7"  (1.702 m)   Wt 202 lb (91.6 kg)   SpO2 92%   BMI 31.64 kg/m   Physical Exam Vitals and nursing note reviewed.  Constitutional:      General: She is not in acute distress. Eyes:     Comments: Left eye ptosis.  Cardiovascular:     Rate and Rhythm: Normal rate and regular rhythm.     Heart sounds: Normal heart sounds. No murmur heard. Pulmonary:     Effort: Pulmonary effort is normal. No respiratory distress.     Breath sounds: Normal breath sounds. No wheezing.  Skin:    General: Skin is warm and dry.     Comments: Right femoral puncture site soft without active bleeding or hematoma.  Neurological:     Mental Status: She is alert.     Comments: Alert, awake, and oriented  x3. Speech and comprehension intact. Right pupil intermittently slightly larger than left, intermittently equal, reactive to light bilaterally. Decreased abduction to left lateral gaze of left eye. No facial asymmetry. Tongue midline. Can spontaneously move all extremities. No pronator drift. Distal pulses (DPs) palpable bilaterally.    Imaging: No results found.  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 09/20/2020. Dr. Corliss Skains at bedside, discussed updates with patient/daughter. All questions answered and concerns addressed. Has associated left eye left gaze palsy and left ptosis, however improving per patient. Right femoral puncture  site stable, distal pulses (DPs) palpable bilaterally. Continue taking Brilinta 90 mg twice daily and Aspirin 81 mg once daily- P2Y12 pending. Will obtain repeat CT head (without contrast) once BP controlled per Dr. Corliss Skains. Encourage ambulation- per PT does not need additional PT however needs assistance with mobility.  Uncontrolled hypertension. SBP goal <150. Did have 1 episode of unsustained chest pain yesterday- no additional chest pain, EKG without cause of chest pain. BP uncontrolled on Norvasc PO and Labetalol IV- currently on Cleviprex IV with  BP 140s/80s. TRH consulted regarding BP management- since patient on Cleviprex IV recommended CCM consult for management. CCM consulted- appreciate input.  Left-sided headaches. Controlled and improved. Continue Norco 5-325 PO Q6 PRN headache.  NIR to follow.   Electronically Signed: Elwin Mocha, PA-C 09/21/2020, 9:20 AM   I spent a total of 25 Minutes at the the patient's bedside AND on the patient's hospital floor or unit, greater than 50% of which was counseling/coordinating care for left ICA cavernous aneurysm with associated carotid cavernous fistula s/p embolization.

## 2020-09-22 ENCOUNTER — Other Ambulatory Visit (HOSPITAL_COMMUNITY): Payer: Self-pay

## 2020-09-22 DIAGNOSIS — I159 Secondary hypertension, unspecified: Secondary | ICD-10-CM

## 2020-09-22 DIAGNOSIS — I10 Essential (primary) hypertension: Secondary | ICD-10-CM

## 2020-09-22 DIAGNOSIS — I5189 Other ill-defined heart diseases: Secondary | ICD-10-CM

## 2020-09-22 LAB — BASIC METABOLIC PANEL
Anion gap: 9 (ref 5–15)
BUN: 7 mg/dL — ABNORMAL LOW (ref 8–23)
CO2: 29 mmol/L (ref 22–32)
Calcium: 9.3 mg/dL (ref 8.9–10.3)
Chloride: 99 mmol/L (ref 98–111)
Creatinine, Ser: 0.95 mg/dL (ref 0.44–1.00)
GFR, Estimated: >60 mL/min (ref >60)
Glucose, Bld: 154 mg/dL — ABNORMAL HIGH (ref 70–99)
Potassium: 3.5 mmol/L (ref 3.5–5.1)
Sodium: 137 mmol/L (ref 135–145)

## 2020-09-22 MED ORDER — HYDROCHLOROTHIAZIDE 25 MG PO TABS
25.0000 mg | ORAL_TABLET | Freq: Two times a day (BID) | ORAL | 0 refills | Status: DC
  Filled 2020-09-22: qty 30, 15d supply, fill #0

## 2020-09-22 MED ORDER — AMLODIPINE BESYLATE 10 MG PO TABS
10.0000 mg | ORAL_TABLET | Freq: Every day | ORAL | 0 refills | Status: DC
  Filled 2020-09-22: qty 30, 30d supply, fill #0

## 2020-09-22 MED ORDER — ASPIRIN 81 MG PO CHEW
81.0000 mg | CHEWABLE_TABLET | Freq: Every day | ORAL | 3 refills | Status: DC
  Filled 2020-09-22: qty 30, 30d supply, fill #0

## 2020-09-22 MED ORDER — TICAGRELOR 90 MG PO TABS
90.0000 mg | ORAL_TABLET | Freq: Two times a day (BID) | ORAL | 3 refills | Status: DC
  Filled 2020-09-22: qty 60, 30d supply, fill #0

## 2020-09-22 MED ORDER — HYDROCODONE-ACETAMINOPHEN 5-325 MG PO TABS
1.0000 | ORAL_TABLET | Freq: Four times a day (QID) | ORAL | 0 refills | Status: DC | PRN
  Filled 2020-09-22: qty 15, 4d supply, fill #0

## 2020-09-22 NOTE — Discharge Summary (Signed)
Patient ID: LOWANDA CASHAW MRN: [id number] DOB/AGE: 1960-12-27 60 y.o.  Admit date: 09/15/2020 Discharge date: 09/22/2020  Supervising Physician: Julieanne Cotton  Patient Status: [facility] - In-pt  Admission Diagnoses: Headaches. Brain aneurysm.  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 In ED, CT orbits/head revealed a partially thrombosed left ICA cavernous aneurysm with enlargement of left superior ophthalmic vein. She was admitted under neurosurgery for further management, started on a Medrol Dosepak, and had tentative plans for diagnostic cerebral arteriogram to further evaluate aneurysm, however per patient's/daughter's request, opted for Neuro-interventional assessment. She underwent an image-guided diagnostic cerebral arteriogram via right radial approach 09/17/2020 by Dr. 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 At that time, though shared decision making, patient/daughter decided to elect for endovascular embolization of aneurysm.  Patient then underwent an image-guided cerebral arteriogram with embolization of left ICA cavernous aneurysm and carotid-cavernous fistula using two pipeline shield flow diverters via right femoral approach 09/18/2020 by Dr. Corliss Skains. Procedure occurred without major complications and patient was transferred to neuro ICU following. That evening, patient found to have ooze of right femoral puncture site. Manual pressure was held with hemostasis of right femoral  puncture site. In addition, struggled with hypertension over the next few days despite multiple PO/IV medications. Also, patient developed one episode of unsustained chest pain 09/20/2020, EKG revealing nothing obvious. CCM was consulted who assisted with adjustment of medications along with recommendation of echocardiogram (given hypertension/chest pain) which revealed grade 1 diastolic dysfunction.  Patient's BP now stable with medication adjustments. Her headache continues to improve at this time. Still with left eye left gaze palsy and left ptosis, however also improving. PT/OT evaluated, no needs for PT however recommended OP OT follow-up.  Patient stable from NIR and CCM standpoint. She finished Medrol Doepak this AM. Plan to discharge home today with close PCP follow-up (patient does not have a PCP at this time, transition of care consulted on discharge), OP OT follow-up, and follow-up with Dr. Corliss Skains for CT head/consult 7-10 days after discharge. P2Y12 pending at this time- continue taking Brilinta 90 mg twice daily and Aspirin 81 mg once daily. Continue taking BP medications on D/C as recommended by CCM. Norco PO written for breakthrough headaches- transition to OTC Ibuprofen/Tylenol as needed.   Consults: pulmonary/intensive care  Significant Diagnostic Studies: CT Head Wo Contrast  Result Date: 09/15/2020 CLINICAL DATA:  Headache EXAM: CT HEAD WITHOUT CONTRAST TECHNIQUE: Contiguous axial images were obtained from the base of the skull through the vertex without intravenous contrast. COMPARISON:  None. FINDINGS: Brain: No acute territorial infarction or hemorrhage is visualized. Extra-axial mass adjacent to the medial left temporal lobe, appears to be associated with cavernous sinus. This measures 19 mm AP by 22 mm transverse. Peripheral high density within the lesion. Mild mass effect on adjacent temporal lobe. Ventricles nonenlarged Vascular: No unexpected calcification. Skull: No fracture  Sinuses/Orbits: Sinuses are clear.  The globes appear intact. Other: None IMPRESSION: 19 x 22 mm left cavernous sinus lesion which could represent a mass or giant aneurysm. Contrast-enhanced MRI is recommended for further evaluation. Electronically Signed  By: Jasmine Pang M.D.   On: 09/15/2020 23:02   MR ANGIO HEAD WO CONTRAST  Result Date: 09/16/2020 CLINICAL DATA:  Initial evaluation for acute headache, left-sided visual disturbance. EXAM: MRI HEAD WITHOUT AND WITH CONTRAST MRA HEAD WITHOUT CONTRAST TECHNIQUE: Multiplanar, multi-echo pulse sequences of the brain and surrounding structures were acquired without and with intravenous contrast. Angiographic images of the Circle of Willis were acquired using MRA technique without intravenous contrast. CONTRAST:  9.63mL GADAVIST GADOBUTROL 1 MMOL/ML IV SOLN COMPARISON:  Prior CTs from 09/15/2020. FINDINGS: MRI HEAD FINDINGS Brain: Cerebral volume within normal limits. Scattered patchy T2/FLAIR hyperintensity within the periventricular deep white matter both cerebral hemispheres most consistent with chronic small vessel ischemic disease, mild in nature. No evidence for acute or subacute infarct. Gray-white matter differentiation maintained. No encephalomalacia to suggest chronic cortical infarction. No acute or chronic intracranial hemorrhage. Previously identified lesion centered at the left cavernous sinus again seen. Lesion measures 2.2 x 2.3 x 1.8 cm (AP by transverse by craniocaudad). Lesion demonstrates heterogeneous T2 signal intensity with a prominent peripheral flow void. Nonenhancing central T2 hypointense component measures up to 2.1 cm. Associated susceptibility artifact suggesting blood products. Following contrast administration, enhancement seen throughout the surrounding left cavernous sinus and likely within the periphery of this lesion, with no enhancement centrally. Finding felt to be most consistent with a large cavernous left ICA aneurysm with  prominent central thrombosed component. Secondary mass effect on the adjacent pituitary gland and pituitary stalk which are deviated to the right (series 32, image 8). Supraclinoid left ICA displaced superiorly, contacting the undersurface of the left optic chiasm (series 32, image 9). Asymmetric prominence of the left superior ophthalmic vein suggests a degree of associated venous congestion related to compression from this lesion. No definite cavernous sinus thrombosis. No edema within the adjacent left temporal lobe. No other mass lesion, mass effect, or midline shift. Slight asymmetry of the lateral ventricles without hydrocephalus. No extra-axial fluid collection. No other abnormal enhancement. Vascular: Large cavernous left ICA aneurysm as above. Major intracranial vascular flow voids maintained. Skull and upper cervical spine: Craniocervical junction within normal limits. Bone marrow signal intensity normal. No scalp soft tissue abnormality. Sinuses/Orbits: Asymmetric prominence of the left superior orbital vein suggestive of venous congestion due to compression from the left ICA aneurysm. Possible CC fistula difficult to exclude. Globes and orbital soft tissues demonstrate no other acute finding. No more than mild mucosal thickening noted within the sphenoethmoidal and maxillary sinuses. No significant mastoid effusion. Inner ear structures grossly normal. Other: None. MRA HEAD FINDINGS Anterior circulation: Visualized distal cervical segments of the internal carotid arteries are patent with antegrade flow. Petrous segments patent bilaterally. Scattered atheromatous irregularity within the cavernous and supraclinoid right ICA without significant stenosis. On the left, probable large cavernous left ICA aneurysm is seen, measuring approximately 1.9 x 2.4 x 1.8 cm on this portion of the exam. Probable partial thrombosis better appreciated on corresponding brain MRI. There is filling and flow related signal seen  extending into the left superior orbital veins (series 1097, image 4). While this could be related to passive congestion from compression by the large aneurysm, a possible fistulous connection (CC fistula) may also be present. Supraclinoid left ICA widely patent distally to the terminus. A1 segments patent bilaterally. Normal anterior communicating artery complex. Anterior cerebral arteries patent to their distal aspects without stenosis. No M1 stenosis or occlusion. Normal MCA bifurcations. MCA branches well perfused and symmetric. Diffuse small vessel atheromatous irregularity noted. Posterior circulation: Both  V4 segments patent to the vertebrobasilar junction without stenosis. Left vertebral artery slightly dominant. Right PICA origin patent and normal. Left PICA not seen. Basilar patent to its distal aspect without stenosis. Superior cerebellar arteries patent bilaterally. Both PCAs primarily supplied via the basilar, although a small left posterior communicating artery noted. PCAs perfused to their distal aspects without significant stenosis. Anatomic variants: None significant. IMPRESSION: MRI HEAD IMPRESSION: 1. 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 Localized mass effect as above. 2. Asymmetric prominence and filling of the left superior orbital vein. While this finding may be secondary to compression by the large aneurysm with associated passive congestion, a possible fistulous component (CC fistula) is difficult to exclude, and may be present as well. Further assessment with dedicated catheter directed arteriogram suggested for definitive characterization. 3. No other acute intracranial abnormality. 4. Underlying mild chronic microvascular ischemic disease for age. MRA HEAD IMPRESSION: 1. Large cavernous left ICA aneurysm as above. 2. No other large vessel occlusion. No proximal high-grade or  correctable stenosis. 3. Diffuse small vessel atheromatous irregularity. Electronically Signed   By: Rise Mu M.D.   On: 09/16/2020 03:52   MR Brain W and Wo Contrast  Result Date: 09/16/2020 CLINICAL DATA:  Initial evaluation for acute headache, left-sided visual disturbance. EXAM: MRI HEAD WITHOUT AND WITH CONTRAST MRA HEAD WITHOUT CONTRAST TECHNIQUE: Multiplanar, multi-echo pulse sequences of the brain and surrounding structures were acquired without and with intravenous contrast. Angiographic images of the Circle of Willis were acquired using MRA technique without intravenous contrast. CONTRAST:  9.74mL GADAVIST GADOBUTROL 1 MMOL/ML IV SOLN COMPARISON:  Prior CTs from 09/15/2020. FINDINGS: MRI HEAD FINDINGS Brain: Cerebral volume within normal limits. Scattered patchy T2/FLAIR hyperintensity within the periventricular deep white matter both cerebral hemispheres most consistent with chronic small vessel ischemic disease, mild in nature. No evidence for acute or subacute infarct. Gray-white matter differentiation maintained. No encephalomalacia to suggest chronic cortical infarction. No acute or chronic intracranial hemorrhage. Previously identified lesion centered at the left cavernous sinus again seen. Lesion measures 2.2 x 2.3 x 1.8 cm (AP by transverse by craniocaudad). Lesion demonstrates heterogeneous T2 signal intensity with a prominent peripheral flow void. Nonenhancing central T2 hypointense component measures up to 2.1 cm. Associated susceptibility artifact suggesting blood products. Following contrast administration, enhancement seen throughout the surrounding left cavernous sinus and likely within the periphery of this lesion, with no enhancement centrally. Finding felt to be most consistent with a large cavernous left ICA aneurysm with prominent central thrombosed component. Secondary mass effect on the adjacent pituitary gland and pituitary stalk which are deviated to the right (series  32, image 8). Supraclinoid left ICA displaced superiorly, contacting the undersurface of the left optic chiasm (series 32, image 9). Asymmetric prominence of the left superior ophthalmic vein suggests a degree of associated venous congestion related to compression from this lesion. No definite cavernous sinus thrombosis. No edema within the adjacent left temporal lobe. No other mass lesion, mass effect, or midline shift. Slight asymmetry of the lateral ventricles without hydrocephalus. No extra-axial fluid collection. No other abnormal enhancement. Vascular: Large cavernous left ICA aneurysm as above. Major intracranial vascular flow voids maintained. Skull and upper cervical spine: Craniocervical junction within normal limits. Bone marrow signal intensity normal. No scalp soft tissue abnormality. Sinuses/Orbits: Asymmetric prominence of the left superior orbital vein suggestive of venous congestion due to compression from the left ICA  aneurysm. Possible CC fistula difficult to exclude. Globes and orbital soft tissues demonstrate no other acute finding. No more than mild mucosal thickening noted within the sphenoethmoidal and maxillary sinuses. No significant mastoid effusion. Inner ear structures grossly normal. Other: None. MRA HEAD FINDINGS Anterior circulation: Visualized distal cervical segments of the internal carotid arteries are patent with antegrade flow. Petrous segments patent bilaterally. Scattered atheromatous irregularity within the cavernous and supraclinoid right ICA without significant stenosis. On the left, probable large cavernous left ICA aneurysm is seen, measuring approximately 1.9 x 2.4 x 1.8 cm on this portion of the exam. Probable partial thrombosis better appreciated on corresponding brain MRI. There is filling and flow related signal seen extending into the left superior orbital veins (series 1097, image 4). While this could be related to passive congestion from compression by the large  aneurysm, a possible fistulous connection (CC fistula) may also be present. Supraclinoid left ICA widely patent distally to the terminus. A1 segments patent bilaterally. Normal anterior communicating artery complex. Anterior cerebral arteries patent to their distal aspects without stenosis. No M1 stenosis or occlusion. Normal MCA bifurcations. MCA branches well perfused and symmetric. Diffuse small vessel atheromatous irregularity noted. Posterior circulation: Both V4 segments patent to the vertebrobasilar junction without stenosis. Left vertebral artery slightly dominant. Right PICA origin patent and normal. Left PICA not seen. Basilar patent to its distal aspect without stenosis. Superior cerebellar arteries patent bilaterally. Both PCAs primarily supplied via the basilar, although a small left posterior communicating artery noted. PCAs perfused to their distal aspects without significant stenosis. Anatomic variants: None significant. IMPRESSION: MRI HEAD IMPRESSION: 1. 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 Localized mass effect as above. 2. Asymmetric prominence and filling of the left superior orbital vein. While this finding may be secondary to compression by the large aneurysm with associated passive congestion, a possible fistulous component (CC fistula) is difficult to exclude, and may be present as well. Further assessment with dedicated catheter directed arteriogram suggested for definitive characterization. 3. No other acute intracranial abnormality. 4. Underlying mild chronic microvascular ischemic disease for age. MRA HEAD IMPRESSION: 1. Large cavernous left ICA aneurysm as above. 2. No other large vessel occlusion. No proximal high-grade or correctable stenosis. 3. Diffuse small vessel atheromatous irregularity. Electronically Signed   By: Rise Mu M.D.   On: 09/16/2020 03:52    ECHOCARDIOGRAM COMPLETE  Result Date: 09/21/2020    ECHOCARDIOGRAM REPORT   Patient Name:   OLUWASEYI TULL Date of Exam: 09/21/2020 Medical Rec #:  [id number]        Height:       67.0 in Accession #:    [id number]       Weight:       202.0 lb Date of Birth:  [birth year]        BSA:          2.031 m Patient Age:    60 years         BP:           132/75 mmHg Patient Gender: F                HR:           67 bpm. Exam Location:  Inpatient Procedure: 2D Echo, Cardiac Doppler and Color Doppler Indications:    Abnormal ECG  History:        Patient  has no prior history of Echocardiogram examinations.  Sonographer:    Shirlean KellyJohn Mendel Brown Referring Phys: [phone] PETER E BABCOCK IMPRESSIONS  1. Left ventricular ejection fraction, by estimation, is 65 to 70%. The left ventricle has normal function. The left ventricle has no regional wall motion abnormalities. There is moderate concentric left ventricular hypertrophy. Left ventricular diastolic parameters are consistent with Grade I diastolic dysfunction (impaired relaxation).  2. Right ventricular systolic function is normal. The right ventricular size is normal.  3. Left atrial size was mildly dilated.  4. The mitral valve is normal in structure. No evidence of mitral valve regurgitation.  5. The aortic valve is tricuspid. Aortic valve regurgitation is not visualized.  6. The inferior vena cava is normal in size with greater than 50% respiratory variability, suggesting right atrial pressure of 3 mmHg. Comparison(s): No prior Echocardiogram. FINDINGS  Left Ventricle: Left ventricular ejection fraction, by estimation, is 65 to 70%. The left ventricle has normal function. The left ventricle has no regional wall motion abnormalities. The left ventricular internal cavity size was normal in size. There is  moderate concentric left ventricular hypertrophy. Left ventricular diastolic parameters are consistent with Grade I diastolic dysfunction (impaired relaxation). Right Ventricle:  The right ventricular size is normal. No increase in right ventricular wall thickness. Right ventricular systolic function is normal. Left Atrium: Left atrial size was mildly dilated. Right Atrium: Right atrial size was normal in size. Pericardium: There is no evidence of pericardial effusion. Mitral Valve: The mitral valve is normal in structure. No evidence of mitral valve regurgitation. Tricuspid Valve: The tricuspid valve is normal in structure. Tricuspid valve regurgitation is not demonstrated. No evidence of tricuspid stenosis. Aortic Valve: The aortic valve is tricuspid. Aortic valve regurgitation is not visualized. Aortic valve mean gradient measures 6.0 mmHg. Aortic valve peak gradient measures 10.2 mmHg. Aortic valve area, by VTI measures 2.65 cm. Pulmonic Valve: The pulmonic valve was normal in structure. Pulmonic valve regurgitation is not visualized. Aorta: The aortic root and ascending aorta are structurally normal, with no evidence of dilitation. Venous: The inferior vena cava is normal in size with greater than 50% respiratory variability, suggesting right atrial pressure of 3 mmHg. IAS/Shunts: The atrial septum is grossly normal.  LEFT VENTRICLE PLAX 2D LVIDd:         5.10 cm  Diastology LVIDs:         3.20 cm  LV e' medial:    7.40 cm/s LV PW:         1.20 cm  LV E/e' medial:  9.2 LV IVS:        1.40 cm  LV e' lateral:   10.10 cm/s LVOT diam:     2.20 cm  LV E/e' lateral: 6.8 LV SV:         85 LV SV Index:   42 LVOT Area:     3.80 cm  RIGHT VENTRICLE             IVC RV Basal diam:  3.30 cm     IVC diam: 0.70 cm RV S prime:     15.80 cm/s TAPSE (M-mode): 2.7 cm LEFT ATRIUM             Index       RIGHT ATRIUM           Index LA diam:        3.90 cm 1.92 cm/m  RA Area:     14.40 cm LA Vol (A2C):   65.7  ml 32.35 ml/m RA Volume:   32.90 ml  16.20 ml/m LA Vol (A4C):   74.0 ml 36.44 ml/m LA Biplane Vol: 69.7 ml 34.32 ml/m  AORTIC VALVE AV Area (Vmax):    2.85 cm AV Area (Vmean):   2.57 cm AV  Area (VTI):     2.65 cm AV Vmax:           160.00 cm/s AV Vmean:          116.000 cm/s AV VTI:            0.320 m AV Peak Grad:      10.2 mmHg AV Mean Grad:      6.0 mmHg LVOT Vmax:         120.00 cm/s LVOT Vmean:        78.400 cm/s LVOT VTI:          0.223 m LVOT/AV VTI ratio: 0.70  AORTA Ao Root diam: 3.70 cm Ao Asc diam:  3.40 cm MITRAL VALVE MV Area (PHT): 4.49 cm    SHUNTS MV Decel Time: 169 msec    Systemic VTI:  0.22 m MV E velocity: 68.30 cm/s  Systemic Diam: 2.20 cm MV A velocity: 97.40 cm/s MV E/A ratio:  0.70 Riley Lam MD Electronically signed by Riley Lam MD Signature Date/Time: 09/21/2020/4:27:45 PM    Final    CT Orbits W Contrast  Result Date: 09/15/2020 CLINICAL DATA:  Initial evaluation for anisocoria, left periorbital discomfort/numbness, pain with left lateral gaze. EXAM: CT ORBITS WITH CONTRAST TECHNIQUE: Multidetector CT images was performed according to the standard protocol following intravenous contrast administration. CONTRAST:  80mL OMNIPAQUE IOHEXOL 300 MG/ML  SOLN COMPARISON:  Concomitant CT of the head. FINDINGS: Orbits: Globes are symmetric in size with normal appearance and morphology. Optic nerves symmetric and normal. Extra-ocular muscles symmetric and within normal limits. Lacrimal glands within normal limits. Intraconal and extraconal fat well-maintained. Prominence of the superior orbital veins seen bilaterally, left greater than right. 19 x 22 mm irregular hypodensity/filling defect seen centered at the left cavernous sinus (series 2, image 11). Abnormality extends medially to involve the sella at the midline. Opacification/filling of the cavernous sinus seen around this lesion. Associated mild osseous remodeling at the adjacent skull base. Findings indeterminate, and could reflect a mass versus large partially thrombosed aneurysm. Visible paranasal sinuses: Mild scattered mucosal thickening noted within the ethmoidal air cells, maxillary, and sphenoid  sinuses. No air-fluid levels to suggest acute sinusitis. Mastoid air cells and middle ear cavities are well pneumatized and free of fluid. Sinuses. Soft tissues: Periorbital soft tissues demonstrate no acute finding. Osseous: Visualized osseous structures demonstrate no acute finding. Few periapical lucencies noted about the visualized teeth. Limited intracranial: Remainder of the visualized brain unremarkable. IMPRESSION: 1. 19 x 22 mm irregular hypodensity/filling defect centered at the left cavernous sinus. Finding is indeterminate, and could reflect a mass versus large partially thrombosed aneurysm. Further evaluation with dedicated brain MRI, with and without contrast recommended. A pituitary protocol MRI would likely be helpful. Corresponding MRA of the circle-of-Willis is also recommended for further evaluation. 2. Otherwise negative CT of the orbits. Electronically Signed   By: Rise Mu M.D.   On: 09/15/2020 23:33   IR ANGIO INTRA EXTRACRAN SEL COM CAROTID INNOMINATE BILAT MOD SED  Result Date: 09/22/2020 CLINICAL DATA:  Left-sided proptosis, orbital chemosis, retro-orbital and supraorbital pain. Giant left internal carotid artery cavernous artery aneurysm on CTA of the head and neck. EXAM: BILATERAL COMMON CAROTID AND INNOMINATE ANGIOGRAPHY  COMPARISON:  CT angiogram of the head and neck of September 16, 2020. MEDICATIONS: Heparin 2000 units IA. No antibiotic was administered within 1 hour of the procedure. ANESTHESIA/SEDATION: Versed 1 mg IV; Fentanyl 25 mcg IV Moderate Sedation Time:  48 minutes The patient was continuously monitored during the procedure by the interventional radiology nurse under my direct supervision. CONTRAST:  Omnipaque 300, 80 mL. FLUOROSCOPY TIME:  Fluoroscopy Time: 15 minutes 12 seconds (913 mGy). COMPLICATIONS: None immediate. TECHNIQUE: Informed written consent was obtained from the patient after a thorough discussion of the procedural risks, benefits and alternatives.  All questions were addressed. Maximal Sterile Barrier Technique was utilized including caps, mask, sterile gowns, sterile gloves, sterile drape, hand hygiene and skin antiseptic. A timeout was performed prior to the initiation of the procedure. The right forearm to the wrist was prepped and draped in the usual sterile manner. The right radial artery was then identified with ultrasound and its morphology documented. A dorsal palmar anastomosis was verified to be present. Using ultrasound guidance radial access was obtained over a 0.018 inch micro guidewire. A 4/5 French radial sheath was then inserted. The obturator, and the 018 micro guidewire was removed. Good aspiration obtained from the side port of the radial sheath. A cocktail of 2000 units of heparin, and 200 mcg of nitroglycerin was then infused without event through the sheath. A right radial arteriogram was performed. Over a 0.035 inch Roadrunner guidewire, a 5 [place] Simmons 2 diagnostic catheter was advanced to the aortic arch region, and selectively positioned in the right vertebral artery, the right common carotid artery, and the left common carotid artery. Following the procedure hemostasis at the right radial puncture was obtained with a wrist band. The distal right radial pulse was verified to be present. FINDINGS: The right vertebral artery origin is widely patent. The vessel is seen to opacify to the cranial skull base. Patency is seen of the right vertebrobasilar junction and the right posterior-inferior cerebellar artery. The opacified portion of the basilar artery, the superior cerebellar arteries and the anterior-inferior cerebellar arteries into the capillary and venous phase is seen. Unopacified blood is seen in the basilar artery from the contralateral vertebral artery. Transient retrograde opacification via the left posterior communicating artery of the distal left ICA, and the left MCA proximally is seen mixed with unopacified blood. The  left common carotid arteriogram demonstrates the left external carotid artery and its major branches to be widely patent. The left internal carotid artery at the bulb to the cranial skull base is widely patent with significant tortuosity and prominence. Giant approximately 22 mm x 20 mm aneurysm is seen arising in the left internal carotid cavernous region with secondary compression on the internal carotid artery. Early opacification of the superior ophthalmic vein in the inferior ophthalmic vein, and the inferior petrosal sinus is noted. Extensive prominence of the pharyngeal venous plexus is also seen. The right internal carotid artery just distal to the aneurysm is widely patent with subsequent opacification of the left middle cerebral artery and partially the left anterior cerebral artery. Unopacified blood is seen in the supraclinoid left ICA, and the left anterior middle cerebral arteries probably representing unopacified blood from the posterior circulation and the right anterior circulation via the anterior communicating artery. A large smooth nonspecific filling defect is seen centrally within the giant aneurysm probably representing an organized thrombus with irregular margins. The right common carotid artery injection demonstrates the right external carotid artery and its major branches to be normal. A  double U shaped tortuosity is seen of the junction of the middle and the proximal 1/3 of the right internal carotid artery. More distally the vessel is seen to opacify to the cranial skull base. Moderate tortuosity is seen of the petrous, cavernous and supraclinoid segments. The right middle cerebral artery and the right anterior cerebral artery opacify into the capillary and venous phases. Prompt opacification of the left anterior cerebral artery A1 and distal A2 segments is seen via the anterior communicating artery from the right internal carotid artery. 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 PLAN: Findings reviewed with the patient and the family. Plan endovascular treatment of the giant left internal carotid artery cavernous region aneurysm with secondary direct cc fistula. Electronically Signed   By: Julieanne Cotton M.D.   On: 09/21/2020 12:41    Treatments: Endovascular embolization of left ICA cavernous aneurysm with associated carotid cavernous fistula using 2 pipeline shield flow diverters.  Discharge Exam: Blood pressure 130/74, pulse 63, temperature 99.5 F (37.5 C), temperature source Axillary, resp. rate 15, height 5\' 7"  (1.702 m), weight 202 lb (91.6 kg), SpO2 95 %. Physical Exam Vitals and nursing note reviewed.  Constitutional:      General: She is not in acute distress.    Appearance: She is obese.  Eyes:     Comments: Left eye ptosis, improved since yesterday.  Cardiovascular:     Rate and Rhythm: Normal rate and regular rhythm.     Heart sounds: Normal heart sounds. No murmur heard. Pulmonary:     Effort: Pulmonary effort is normal. No respiratory distress.     Breath sounds: Normal breath sounds. No wheezing.  Skin:    General: Skin is warm and dry.     Comments: Right femoral puncture site soft without active bleeding or hematoma.  Neurological:     Mental Status: She is alert.     Comments: Alert, awake, and oriented x3. Speech and comprehension intact. PERRL bilaterally. Decreased abduction to left lateral gaze of left eye, slightly improved since yesterday. No facial asymmetry. Tongue midline. Can spontaneously move all extremities. No pronator drift. Distal pulses (DPs) palpable bilaterally.     Disposition: Discharge disposition: 01-Home or Self Care       Discharge Instructions     Call  MD for:  difficulty breathing, headache or visual disturbances   Complete by: As directed    Call MD for:  extreme fatigue   Complete by: As directed    Call MD for:  hives   Complete by: As directed    Call MD for:  persistant dizziness or light-headedness   Complete by: As directed    Call MD for:  persistant nausea and vomiting   Complete by: As directed    Call MD for:  redness, tenderness, or signs of infection (pain, swelling, redness, odor or green/yellow discharge around incision site)   Complete by: As directed    Call MD for:  severe uncontrolled pain   Complete by: As directed    Call MD for:  temperature >100.4   Complete by: As directed    Diet - low sodium heart healthy   Complete by: As directed    Discharge instructions   Complete by: As directed    Continue taking Brilinta 90 mg twice daily and Aspirin  81 mg once daily.   Driving Restrictions   Complete by: As directed    No driving self until follow-up appointment. Ok to be passenger in car during this time.   Increase activity slowly   Complete by: As directed    Lifting restrictions   Complete by: As directed    No stooping, bending, or lifting more than 10 pounds until follow-up appointment.   Remove dressing in 24 hours   Complete by: As directed    Ok to shower 24 hours after discharge. Recommend showering with bandage on, remove bandage immediately after showering and pat area dry. No further dressing changes needed after this- ensure area remains clean/dry until fully healed. No submerging (swimming, bathing) for 7 days post-procedure.      Allergies as of 09/22/2020   No Known Allergies      Medication List     TAKE these medications    amLODipine 10 MG tablet Commonly known as: NORVASC Take 1 tablet (10 mg total) by mouth daily. Start taking on: September 23, 2020   aspirin 81 MG chewable tablet Chew 1 tablet (81 mg total) by mouth daily. Start taking on: September 23, 2020   hydrochlorothiazide  25 MG tablet Commonly known as: HYDRODIURIL Take 1 tablet (25 mg total) by mouth 2 (two) times daily.   HYDROcodone-acetaminophen 5-325 MG tablet Commonly known as: NORCO/VICODIN Take 1 tablet by mouth every 6 (six) hours as needed for moderate pain or severe pain.   multivitamin with minerals Tabs tablet Take 1 tablet by mouth daily.   ticagrelor 90 MG Tabs tablet Commonly known as: BRILINTA Take 1 tablet (90 mg total) by mouth 2 (two) times daily.        Follow-up Information     Julieanne Cotton, MD Follow up.   Specialties: Interventional Radiology, Radiology Why: Please follow-up with Dr. Corliss Skains for CT head and consult same day 7-10 days after discharge. Our office will call you to set up this appointment. Contact information: [address] [place] [address] [phone]         PCP Follow up in 2 week(s).   Why: Please follow-up with PCP within 1-2 weeks of discharge for medication refills/general health assessment. Our transition of care team will help you find a PCP.                 Electronically Signed: Elwin Mocha, PA-C 09/22/2020, 9:48 AM   I have spent Greater Than 30 Minutes discharging [facility].

## 2020-09-22 NOTE — Assessment & Plan Note (Signed)
BP control Daily weights  Low Na diet F/u pcp

## 2020-09-22 NOTE — Assessment & Plan Note (Signed)
Home on norvasc 10 and hctz 25 bid Low Na diet Needs PCP

## 2020-09-22 NOTE — Plan of Care (Signed)
Care plan completed/met

## 2020-09-22 NOTE — Progress Notes (Signed)
NAME:  Rose Marquez, MRN:  [id number], DOB:  29-Sep-1960, LOS: 5 ADMISSION DATE:  09/15/2020, CONSULTATION DATE:  6/27 REFERRING MD: Corliss Skains CHIEF COMPLAINT:  BP management and critical care medicine   History of Present Illness:  60 year old previously healthy female presented to the [facility] ER on 6/22 w/ cc: 2d h/o left facial and left eye pain, forehead numbness and double vision.  -Dx eval concerning for giant left carotid aneurysm compressing the left cavernous sinus;  transferred to [facility] for angiogram. Underwent cerebral angiogram 6/23; found 58mm x 20 mm partially thrombosed Left ICA cavernous aneurysm abd CC fistula  extending into the left cavernous sinus causing the increased pressure on left orbit.  On 6/24 underwent embolization of Left ICA aneurysm and CC fistula CT brain f/u was neg for bleed. Extubated.  6/27 PCCM asked to assist for BP management   Pertinent  Medical History  No PMH other than tobacco abuse  Significant Hospital Events: Including procedures, antibiotic start and stop dates in addition to other pertinent events   6/22 w/ cc: 2d h/o left facial and left eye pain, forehead numbness and double vision.  -Dx eval concerning for giant left carotid aneurysm compressing the left cavernous sinus;  transferred to [facility] for angiogram. Underwent cerebral angiogram 6/23; found 51mm x 20 mm partially thrombosed Left ICA cavernous aneurysm abd CC fistula  extending into the left cavernous sinus causing the increased pressure on left orbit.  On 6/24 underwent embolization of Left ICA aneurysm and CC fistula CT brain f/u was neg for bleed. Extubated.  6/24-6/27 recovering in ICU for BP and pain management  6/27 PCCM asked to assist for BP management ->increased norvasc to 10 and added HCTZ. ECHO LV EF 65-70%. No WM abnormality, Concentric LVH, grade I diastolic dysfxn, LA dilated.  6/28 off cleveprex PCCM signed off recs: home on Norvasc and bid hctz, low Na diet, get PCP  Interim  History / Subjective:  Still w/ some HA but otherwise the same. BP good  Objective   Blood pressure (Abnormal) 141/89, pulse (Abnormal) 57, temperature 99.5 F (37.5 C), temperature source Axillary, resp. rate 13, height 5\' 7"  (1.702 m), weight 91.6 kg, SpO2 95 %.        Intake/Output Summary (Last 24 hours) at 09/22/2020 0726 Last data filed at 09/22/2020 0200 Gross per 24 hour  Intake 66.17 ml  Output no documentation  Net 66.17 ml    Filed Weights   09/15/20 1536  Weight: 91.6 kg    Examination: general: well nourished , not in acute distress, appears stated age, conversant , calm, and sitting in bed HENT: Normocephalic, without obvious abnormality, Sclerae anicteric, Moist Conjunctiva , and otherleft ptosis  , mouth: mucous membranes moist and no oral lesions  lungs: normal respiratory effort, not labored, clear to ascultation , and oxygen requirements room air Heart sounds are normal.  Regular rate and rhythm without murmur, gallop or rub. Soft, non-tender, normal bowel sounds; no bruits, organomegaly or masses. Abnormal shape: obese normal strength, tone, and muscle mass, no deformities there are no suspicious lesions or rashes of concern mental status, speech normal, alert and oriented x3, PERLA, and III,VII: ptosis left Voids spont     Resolved Hospital Problem list   Isolated episode of CP  Assessment & Plan:  Hypertension w/ Grade I diastolic dysfxn -suspect that this was present prior to her admission just not diagnosed.  Plan Cont Norvasc and HCTZ (home on these) Dc Cleviprex F/u PCP  Left sided HA 2/2 Left ICA aneurysm w/ Carotic Cavernous Fistula (POA) s/p embolization on 6/24 by Neuro- IR Plan BP at Goal (<150) HA control PRN pain Neuro checks   Minimal leukocytosis  Plan  Trend    Best Practice (right click and "Reselect all SmartList Selections" daily)  Per primary    We will s/o call PRN  Simonne Martinet ACNP-BC [facility]  Pulmonary/Critical Care Pager # [phone] OR # [phone] if no answer

## 2020-09-22 NOTE — Anesthesia Postprocedure Evaluation (Signed)
Anesthesia Post Note  Patient: Rose Marquez  Procedure(s) Performed: IR WITH ANESTHESIA ANEURYSM EMBOLIZATION     Patient location during evaluation: PACU Anesthesia Type: General Level of consciousness: sedated Pain management: pain level controlled Vital Signs Assessment: post-procedure vital signs reviewed and stable Respiratory status: spontaneous breathing and respiratory function stable Cardiovascular status: stable Postop Assessment: no apparent nausea or vomiting Anesthetic complications: no   No notable events documented.  Last Vitals:  Vitals:   09/22/20 0900 09/22/20 0904  BP: 130/74 130/74  Pulse: 63   Resp: 15   Temp:    SpO2: 95%     Last Pain:  Vitals:   09/22/20 0800  TempSrc:   PainSc: 0-No pain                 Mellody Dance

## 2020-09-22 NOTE — Plan of Care (Signed)
?  Problem: Education: ?Goal: Knowledge of General Education information will improve ?Description: Including pain rating scale, medication(s)/side effects and non-pharmacologic comfort measures ?Outcome: Adequate for Discharge ?  ?Problem: Health Behavior/Discharge Planning: ?Goal: Ability to manage health-related needs will improve ?Outcome: Adequate for Discharge ?  ?Problem: Clinical Measurements: ?Goal: Ability to maintain clinical measurements within normal limits will improve ?Outcome: Adequate for Discharge ?Goal: Will remain free from infection ?Outcome: Adequate for Discharge ?Goal: Diagnostic test results will improve ?Outcome: Adequate for Discharge ?Goal: Respiratory complications will improve ?Outcome: Adequate for Discharge ?Goal: Cardiovascular complication will be avoided ?Outcome: Adequate for Discharge ?  ?Problem: Activity: ?Goal: Risk for activity intolerance will decrease ?Outcome: Adequate for Discharge ?  ?Problem: Nutrition: ?Goal: Adequate nutrition will be maintained ?Outcome: Adequate for Discharge ?  ?Problem: Pain Managment: ?Goal: General experience of comfort will improve ?Outcome: Adequate for Discharge ?  ?Problem: Safety: ?Goal: Ability to remain free from injury will improve ?Outcome: Adequate for Discharge ?  ?Problem: Skin Integrity: ?Goal: Risk for impaired skin integrity will decrease ?Outcome: Adequate for Discharge ?  ?

## 2020-09-22 NOTE — Hospital Course (Addendum)
  Best Practice (right click and "Reselect all SmartList Selections" daily)   Diet/type: Regular consistency (see orders) Activity and progression: PT and OOB in Chair VAP bundle: Not indicated PAD protocol: Not indicated DVT prophylaxis: SCD Glycemic control: not indicated GI prophylaxis: N/A Lines: N/A Foley:  N/A Culture data: none  Antibiotics: not indicated.  Stop date: N/A Pending studies: CT head; timing to be determined by IR Code status: ccm code plan: Full code Disposition: ready for transfer to med-surg     Last date of multidisciplinary goals of care discussion [per primary ]

## 2020-09-22 NOTE — Progress Notes (Signed)
Pt discharged home with daughter. Pt and daughter verbalize understanding of discharge instructions and prescriptions. Vital signs stable at time of discharge, pt A/Ox4.

## 2020-09-22 NOTE — TOC Transition Note (Signed)
Transition of Care [facility]) - CM/SW Discharge Note   Patient Details  Name: Rose Marquez MRN: [id number] Date of Birth: 1960/04/06  [facility] CM/SW Contact:  Ella Bodo, RN Phone Number: 09/22/2020, 11:50am  Clinical Narrative:   Patient admitted on 09/15/2020 with left ICA aneurysm with carotid cavernous fistula; she underwent embolization of aneurysm and fistula on 09/18/2020 by neuro IR.  Prior to admission, patient independent and living at home alone.  Met with patient and her daughter to discuss discharge arrangements.  Daughter states she will be able to provide 24-hour supervision at discharge.  OT recommending outpatient follow-up, and patient is agreeable to referral; referral made to [place] for follow-up.  Patient has no PCP, but states she would like to follow-up with Selina Cooley, NP; attempted to make follow-up appointment for patient with this provider, but office appears to be closed at this time.  Provider information left on patient's AVS; patient/daughter to call provider to make appointment within the next 1 to 2 weeks.  Stressed the importance of making this appointment as soon as possible; patient and daughter verbalized understanding of this.    Final next level of care: OP Rehab Barriers to Discharge: Barriers Resolved   Patient Goals and CMS Choice Patient states their goals for this hospitalization and ongoing recovery are:: to go home      Discharge Placement                       Discharge Plan and Services   Discharge Planning Services: CM Consult                                 Social Determinants of Health (SDOH) Interventions     Readmission Risk Interventions No flowsheet data found.  Reinaldo Raddle, RN, BSN  Trauma/Neuro ICU Case Manager [phone]

## 2020-09-22 NOTE — Assessment & Plan Note (Addendum)
BP goal <150 Serial neuro checks F/u imaging TBD by neuro IR team Asa and brilinta

## 2020-09-25 LAB — PLATELET INHIBITION P2Y12: Platelet Function  P2Y12: 51 [PRU] — ABNORMAL LOW (ref 182–335)

## 2020-09-29 ENCOUNTER — Ambulatory Visit: Payer: BC Managed Care – PPO | Admitting: Occupational Therapy

## 2020-09-29 ENCOUNTER — Telehealth (HOSPITAL_COMMUNITY): Payer: Self-pay

## 2020-09-29 NOTE — Telephone Encounter (Signed)
Returned call to inform daughter that we are waiting on insurance approval for ct head. Will call to schedule once authorized. AW

## 2020-09-30 ENCOUNTER — Ambulatory Visit: Payer: BC Managed Care – PPO | Admitting: Occupational Therapy

## 2020-10-01 ENCOUNTER — Other Ambulatory Visit: Payer: Self-pay

## 2020-10-01 ENCOUNTER — Other Ambulatory Visit (HOSPITAL_COMMUNITY): Payer: Self-pay | Admitting: Interventional Radiology

## 2020-10-01 ENCOUNTER — Ambulatory Visit: Payer: BC Managed Care – PPO | Attending: Student | Admitting: Occupational Therapy

## 2020-10-01 ENCOUNTER — Other Ambulatory Visit (HOSPITAL_COMMUNITY): Payer: Self-pay

## 2020-10-01 DIAGNOSIS — R41840 Attention and concentration deficit: Secondary | ICD-10-CM | POA: Diagnosis present

## 2020-10-01 DIAGNOSIS — R41842 Visuospatial deficit: Secondary | ICD-10-CM | POA: Insufficient documentation

## 2020-10-01 DIAGNOSIS — R29818 Other symptoms and signs involving the nervous system: Secondary | ICD-10-CM | POA: Diagnosis present

## 2020-10-01 DIAGNOSIS — R519 Headache, unspecified: Secondary | ICD-10-CM

## 2020-10-01 DIAGNOSIS — I671 Cerebral aneurysm, nonruptured: Secondary | ICD-10-CM

## 2020-10-02 NOTE — Therapy (Signed)
[facility] [address] [place], [place], [address] Phone: [phone]   Fax:  [phone]  Occupational Therapy Evaluation  Patient Details  Name: Rose Marquez MRN: [id number] Date of Birth: December 07, 1960 No data recorded  Encounter Date: 10/01/2020   OT End of Session - 10/01/20 1514     Visit Number 1    Number of Visits 7   Date for OT Re-Evaluation 01/06/2021   Authorization Type BCBS   Authorization Time Period VL: 30 (combined PT, OT, Chiro)   OT Start Time 1315   OT Stop Time 1355   OT Time Calculation (min) 40 min   Behavior During Therapy [facility] for tasks assessed/performed            No past medical history on file.  Past Surgical History:  Procedure Laterality Date   IR ANGIO INTRA EXTRACRAN SEL COM CAROTID INNOMINATE BILAT MOD SED  09/18/2020   IR TRANSCATH/EMBOLIZ  09/18/2020   RADIOLOGY WITH ANESTHESIA N/A 09/18/2020   Procedure: IR WITH ANESTHESIA ANEURYSM EMBOLIZATION;  Surgeon: Julieanne Cottoneveshwar, Sanjeev, MD;  Location: [facility] OR;  Service: Radiology;  Laterality: N/A;    There were no vitals filed for this visit.    Subjective Assessment - 10/01/20 1351    Subjective Pt arrives to OT evaluation w/ primary complaints related to diplopia, visual perceptual deficits, and L-sided facial numbness/altered sensation. Pt reports she experienced a headache on 09/13/20 that progressed to include numbness and tingling on the L side of her face and behind her L eye. She reports she was admitted to the ED a few days later and remained in the hospital for about 1 week.   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 No/denies -- Pain in L eye when attempting to focus)             [facility] Assessment - 10/01/20 13     Assessment    Medical Diagnosis Nonintractable headache; diplopia   Referring Provider (OT) Alexandra Loud, PA-C   Onset Date/Surgical Date 09/13/2020   Hand Dominance Right       Precautions    Precautions Fall       Restrictions    Other Position/Activity Restrictions No driving; no straining       Balance Screen    Has the patient fallen in the past 6 months No       Home Environment    Type of Home House   Home Access Level entry   Home Layout Two level -- Pimarily lives in basement    Alternate Level Stairs-Rails Can reach both   Alternate Level Stair-Number of Steps ~15   Bathroom Shower/Tub Tub/Shower unit   Additional Comments Reports no difficulty w/ stairs as long as rails are accessible   Lives With Along -- Daughter living with her temporarily ([place] in [place])       Prior Function    Level of Independence Independent   Vocation Full time employment   Vocation Requirements [employer]: paperwork, numbers, computer   Leisure Go on walks, watching TV, games on phone       ADL    ADL comments Pt reports no difficulties w/ BADLs at this time  IADL    Prior Level of Function International aid/development worker Relies on family or friends for transportation -- Unable to drive per medical precautions   Medication Management Takes responsibility if medication is prepared in advance in seperate dosage -- Daughter organizes medications in advance       Mobility    Mobility Status Comments Ambulated into session w/ mild difficulty; CGA due to balance. Pt reports occasional difficulties w/ balance, but not typically w/ ambulation. No difficulty w/ ambulation when transitioning out of session.       Vision - History    Baseline Vision Other (comment) -- Wearing reading glasses (power 2.75 per pt report) w/ L eye occluded   Patient Visual Report Eye fatigue/eye pain/headache -- Particular difficulties w/  glare   Additional Comments Reports lack of regular eye exams via optometrist       Vision Assessment    Eye Alignment Impaired (comment)   Vision Assessment Vision impaired  _ to be further tested in functional context   Ocular Range of Motion Restricted on left   Tracking/Visual Pursuits Impaired - to be further tested in functional context -- Decreased smoothness of tracking, particularly w/ L eye. Tested w/ both eyes and each eye individually; decreased smoothness of R eye tracking when tested individually   Saccades Impaired - to be further tested in functional context -- Pt also reported discomfort in/around L eye   Convergence WFL   Visual Fields No apparent deficits   Diplopia Assessment Objects split side to side   Comment When attempting to focus w/out glasses on, pt consistently closed R, unaffected eye. Identified 20/21 targets w/ 1.5 M number cancellation and 13/13 w/ 4 M. No difficulty w/ Trail Making Test: Part A or B       Cognition    Overall Cognitive Status Cognition to be further assessed in functional context PRN   Area of Impairment Memory   Memory Comments Pt reports decreased memory and "brain fog" s/p covid dx in 2020       Observation/Other Assessments    Observations L eye redness and swelling; ptosis       Sensation    Additional Comments Reports numbness and some tingling on L side of face/forehead and eye       Coordination    Finger Nose Finger Test Impaired; dysmetria only on first attempt             OT Education - 10/01/20 1514     Education Details Education provided on role and purpose of OT. OT also recommended pt discuss w/ her physician/PCP about following up with optometry/ophthalmology or receiving a referral to neuro-ophthalmologist   Person(s) Educated Patient;Child(ren)    Methods Explanation    Comprehension Verbalized understanding              OT Short Term Goals - 10/01/20 1400       OT SHORT TERM GOAL #1   Title Pt  will independently verbalize appropriate visual compensatory strategies to implement during IADLs prn    Time 3    Period Weeks    Status New    Target Date 10/22/20      OT SHORT TERM GOAL #2   Title Pt will safely complete medication mgmt w/ Mod I, using adaptive strategies/equipment prn (e.g., alarms, enlarged print, pill organizer) 100% of the time in preparation for living alone    Time 3    Period Weeks  Status New              OT Long Term Goals - 10/01/20 1400       OT LONG TERM GOAL #1   Title Pt will complete computer-based activity for at least 10 minutes w/ Mod I and no reported diplopia/headache to facilitate readiness for potential return-to-work    Time 6    Period Weeks    Status New    Target Date 11/12/20      OT LONG TERM GOAL #2   Title Pt will demonstrate improved tabletop visual scanning/seach and speed for participation in work-related tasks as evidenced by improving Trail Making Test: Part B time by at least 20 seconds    Baseline TMT: Part A in 1 min, 40 sec (age-related norm 64 sec)    Time 6    Period Weeks    Status New      OT LONG TERM GOAL #3   Title Pt will complete environmental scanning activity w/out LOB and at least 90% accuracy to facilitate readiness for potential return to driving    Time 6    Period Weeks    Status New              Plan - 10/01/20 1532     Clinical Impression Statement Pt is a 60 y.o. 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 No relevant reported PMHx. Pt was working full-time as a Building services engineer prior to onset. Pt will benefit from skilled occupational therapy services to address diplopia, visual perception, altered sensation, cognition, safety awareness, and compensatory strategies, including use of AE prn, to improve participation and safety during ADLs  and facilitate potential return-to-work.   OT Occupational Profile and History Detailed Assessment- Review of Records and additional review of physical, cognitive, psychosocial history related to current functional performance    Occupational performance deficits (Please refer to evaluation for details): ADL's;IADL's;Work;Leisure;Social Participation    Body Structure / Function / Physical Skills ADL;Balance;Decreased knowledge of use of DME;Gait;Vision;Edema;Sensation;Pain;IADL    Cognitive Skills Attention;Memory;Problem Solve;Perception    Psychosocial Skills Environmental  Adaptations    Rehab Potential Fair    Clinical Decision Making Several treatment options, min-mod task modification necessary    Comorbidities Affecting Occupational Performance: May have comorbidities impacting occupational performance    Modification or Assistance to Complete Evaluation  No modification of tasks or assist necessary to complete eval    OT Frequency 1x / week    OT Duration 6 weeks    OT Treatment/Interventions Self-care/ADL training;Therapeutic exercise;Visual/perceptual remediation/compensation;Patient/family education;Energy conservation;Building services engineer;Therapeutic activities;Balance training;DME and/or AE instruction;Manual Therapy;Cognitive remediation/compensation    Plan Review goals w/ pt; introduce visual compensatory strategies   Recommended Other Services Optometry/ophthalmology; may be more appropriate for vision therapy?   Consulted and Agree with Plan of Care Patient             Patient will benefit from skilled therapeutic intervention in order to improve the following deficits and impairments:   Body Structure / Function / Physical Skills: ADL, Balance, Decreased knowledge of use of DME, Gait, Vision, Edema, Sensation, Pain, IADL Cognitive Skills: Attention, Memory, Problem Solve, Perception Psychosocial Skills: Environmental  Adaptations   Visit  Diagnosis: Visuospatial deficit  Nonintractable headache, unspecified chronicity pattern, unspecified headache type  Other symptoms and signs involving the nervous system  Attention and concentration deficit  Problem List Patient Active Problem List   Diagnosis Date Noted   Hypertension 09/22/2020   Diastolic dysfunction: grade 1 15/72/6203   Brain aneurysm 09/18/2020   Cerebral aneurysm:  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 [place], [place], [address] Phone: [phone]   Fax:  [phone]  Name: URVI IMES MRN: [id number] Date of Birth: 1960/08/10

## 2020-10-06 ENCOUNTER — Other Ambulatory Visit: Payer: Self-pay

## 2020-10-06 ENCOUNTER — Ambulatory Visit (HOSPITAL_COMMUNITY)
Admission: RE | Admit: 2020-10-06 | Discharge: 2020-10-06 | Disposition: A | Discharge status: Home / Self Care | Payer: BC Managed Care – PPO | Source: Ambulatory Visit | Attending: Interventional Radiology | Admitting: Interventional Radiology

## 2020-10-06 DIAGNOSIS — I671 Cerebral aneurysm, nonruptured: Secondary | ICD-10-CM

## 2020-10-06 DIAGNOSIS — R519 Headache, unspecified: Secondary | ICD-10-CM | POA: Diagnosis present

## 2020-10-07 ENCOUNTER — Ambulatory Visit (HOSPITAL_COMMUNITY)
Admission: RE | Admit: 2020-10-07 | Discharge: 2020-10-07 | Disposition: A | Discharge status: Home / Self Care | Payer: BC Managed Care – PPO | Source: Ambulatory Visit | Attending: Student | Admitting: Student

## 2020-10-07 DIAGNOSIS — I671 Cerebral aneurysm, nonruptured: Secondary | ICD-10-CM

## 2020-10-09 ENCOUNTER — Ambulatory Visit: Payer: BC Managed Care – PPO | Admitting: Occupational Therapy

## 2020-10-09 ENCOUNTER — Ambulatory Visit (HOSPITAL_COMMUNITY)
Admission: RE | Admit: 2020-10-09 | Discharge: 2020-10-09 | Disposition: A | Discharge status: Home / Self Care | Payer: BC Managed Care – PPO | Source: Ambulatory Visit | Attending: Student | Admitting: Student

## 2020-10-09 ENCOUNTER — Other Ambulatory Visit: Payer: Self-pay

## 2020-10-12 ENCOUNTER — Ambulatory Visit: Payer: BC Managed Care – PPO | Admitting: Occupational Therapy

## 2020-10-12 HISTORY — PX: IR RADIOLOGIST EVAL & MGMT: IMG5224

## 2020-10-13 ENCOUNTER — Encounter (HOSPITAL_COMMUNITY): Payer: Self-pay

## 2020-10-20 ENCOUNTER — Ambulatory Visit: Payer: BC Managed Care – PPO | Admitting: *Deleted

## 2020-10-21 ENCOUNTER — Telehealth (HOSPITAL_COMMUNITY): Payer: Self-pay

## 2020-10-21 ENCOUNTER — Other Ambulatory Visit (HOSPITAL_COMMUNITY): Payer: Self-pay

## 2020-10-21 NOTE — Telephone Encounter (Signed)
Pharmacy Transitions of Care Follow-up Telephone Call  Date of discharge: 09/22/20  Discharge Diagnosis: Aneurysm  How have you been since you were released from the hospital?  Patient has been doing well since discharge, no questions at this time.  Medication changes made at discharge:  - START: Brilinta  Medication changes verified by the patient? Yes    Medication Accessibility:  Home Pharmacy: not discussed, refills already at home pharmacy   Was the patient provided with refills on discharged medications? Yes   Have all prescriptions been transferred from [facility] to home pharmacy?  Yes  Is the patient able to afford medications? Patient has BCBS    Medication Review: TICAGRELOR (BRILINTA) Ticagrelor 90 mg BID initiated on 09/22/20.  - Educated patient on expected duration of therapy of aspirin ASA 81mg  with ticagrelor.  - Advised patient of medications to avoid (NSAIDs, aspirin maintenance doses>100 mg daily) - Educated that Tylenol (acetaminophen) will be the preferred analgesic to prevent risk of bleeding  - Emphasized importance of monitoring for signs and symptoms of bleeding (abnormal bruising, prolonged bleeding, nose bleeds, bleeding from gums, discolored urine, black tarry stools)  - Educated patient to notify doctor if shortness of breath or abnormal heartbeat occur - Advised patient to alert all providers of antiplatelet therapy prior to starting a new medication or having a procedure   Follow-up Appointments:  PCP Hospital f/u appt confirmed? Patient already had follow up  Specialist Hospital f/u appt confirmed? Patient has multiple rehab sessions over the course of July into August scheduled, though they have been a no show multiple times for these appointments  If their condition worsens, is the pt aware to call PCP or go to the Emergency Dept.? Yes  Final Patient Assessment: Patient has follow up scheduled and refills at home pharmacy

## 2020-10-26 ENCOUNTER — Other Ambulatory Visit (HOSPITAL_COMMUNITY): Payer: Self-pay | Admitting: Student

## 2020-10-26 ENCOUNTER — Other Ambulatory Visit (HOSPITAL_COMMUNITY): Payer: Self-pay | Admitting: Interventional Radiology

## 2020-10-26 DIAGNOSIS — I671 Cerebral aneurysm, nonruptured: Secondary | ICD-10-CM

## 2020-10-26 MED ORDER — HYDROCODONE-ACETAMINOPHEN 5-325 MG PO TABS: 1.0000 | ORAL_TABLET | Freq: Four times a day (QID) | ORAL | 0 refills | Status: DC | PRN

## 2020-10-26 NOTE — Progress Notes (Signed)
Received a staff message regarding severe left eye pain.   Discussed with Dr. Frederik Pear, 15 tablets of Norco 5/325 was sent to pharmacy but NIR will not prescribe further narcotics.  Patient will need to follow up with PCP for further management of the left eye pain.   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   Lynann Bologna  PA-C 10/26/2020 12:01 PM

## 2020-10-29 ENCOUNTER — Ambulatory Visit: Payer: BC Managed Care – PPO | Admitting: Occupational Therapy

## 2020-11-06 ENCOUNTER — Ambulatory Visit (HOSPITAL_COMMUNITY)
Admission: RE | Admit: 2020-11-06 | Discharge: 2020-11-06 | Disposition: A | Discharge status: Home / Self Care | Payer: BC Managed Care – PPO | Source: Ambulatory Visit | Attending: Interventional Radiology | Admitting: Interventional Radiology

## 2020-11-06 ENCOUNTER — Other Ambulatory Visit: Payer: Self-pay

## 2020-11-06 ENCOUNTER — Encounter (HOSPITAL_COMMUNITY): Payer: Self-pay

## 2020-11-06 DIAGNOSIS — I671 Cerebral aneurysm, nonruptured: Secondary | ICD-10-CM | POA: Diagnosis present

## 2020-11-10 ENCOUNTER — Other Ambulatory Visit (HOSPITAL_COMMUNITY): Payer: Self-pay | Admitting: Interventional Radiology

## 2020-11-10 DIAGNOSIS — I671 Cerebral aneurysm, nonruptured: Secondary | ICD-10-CM

## 2020-11-13 ENCOUNTER — Other Ambulatory Visit: Payer: Self-pay

## 2020-11-13 ENCOUNTER — Ambulatory Visit (HOSPITAL_COMMUNITY)
Admission: RE | Admit: 2020-11-13 | Discharge: 2020-11-13 | Disposition: A | Discharge status: Home / Self Care | Payer: BC Managed Care – PPO | Source: Ambulatory Visit | Attending: Interventional Radiology | Admitting: Interventional Radiology

## 2020-11-13 DIAGNOSIS — I671 Cerebral aneurysm, nonruptured: Secondary | ICD-10-CM

## 2020-11-18 HISTORY — PX: IR RADIOLOGIST EVAL & MGMT: IMG5224

## 2020-12-01 ENCOUNTER — Other Ambulatory Visit (HOSPITAL_COMMUNITY): Payer: Self-pay | Admitting: Interventional Radiology

## 2020-12-01 DIAGNOSIS — I671 Cerebral aneurysm, nonruptured: Secondary | ICD-10-CM

## 2020-12-02 ENCOUNTER — Other Ambulatory Visit (HOSPITAL_COMMUNITY)
Admission: RE | Admit: 2020-12-02 | Discharge: 2020-12-02 | Disposition: A | Discharge status: Home / Self Care | Payer: BC Managed Care – PPO | Source: Ambulatory Visit | Attending: Interventional Radiology | Admitting: Interventional Radiology

## 2020-12-02 ENCOUNTER — Other Ambulatory Visit (HOSPITAL_COMMUNITY): Payer: Self-pay

## 2020-12-02 DIAGNOSIS — I729 Aneurysm of unspecified site: Secondary | ICD-10-CM

## 2020-12-07 LAB — PLATELET INHIBITION P2Y12

## 2020-12-08 ENCOUNTER — Telehealth: Payer: Self-pay | Admitting: Student

## 2020-12-08 NOTE — Telephone Encounter (Addendum)
P2Y12 from 12/02/20 was <1  Patient currently taking 45 mg Brilinta BID plus 81 mg aspirin daily.   Dr. Corliss Skains would like patient to decrease Brilinta to 45 mg daily and to continue aspirin. He would like P2Y12 level rechecked in approximately 5 days.   Patient's cell number called and a VM message was left notifying her that some changes to her medications need to be made and for her to please call us back.  Alwyn Ren, [place] [phone] 12/08/2020, 2:53 PM    12/08/20 1504 Update: I spoke with the patient and she is aware of the changes to her medication and aware of the need for her P2Y12 to be rechecked. I've sent a message to our schedulers to call the patient with a date and time for this appointment.

## 2020-12-10 ENCOUNTER — Encounter (HOSPITAL_COMMUNITY): Payer: Self-pay | Admitting: Interventional Radiology

## 2020-12-10 ENCOUNTER — Other Ambulatory Visit: Payer: Self-pay

## 2020-12-10 NOTE — Progress Notes (Signed)
PCP - Dayton Scrape, FNP Cardiologist - denies EKG -  Chest x-ray -  ECHO -  Cardiac Cath -  CPAP -   Blood Thinner Instructions: pt has instructions about Brilinta and ASA but mentioned she also was unclear instructions about DOS -- pt getting labs done prior to surgery, pt instructed to call Dr. Fatima Sanger office to clarify about DOS instructions for Brilinta and ASA dosing and regimen Aspirin Instructions:   ERAS Protcol -   COVID TEST- 12/14/20  Anesthesia review: n.a  -------------  SDW INSTRUCTIONS:  Your procedure is scheduled on Wednesday 9/21. Please report to [facility] Main Entrance "A" at 0630 A.M., and check in at the Admitting office. Call this number if you have problems the morning of surgery: [phone]   Remember: Do not eat or drink after midnight the night before your surgery   Medications to take morning of surgery with a sip of water include: amLODipine (NORVASC)  Follow your surgeon's instructions on when to stop aspirin 81 MG and ticagrelor (BRILINTA).   If no instructions were given by your surgeon then you will need to call the office to get those instructions.    As of today, STOP taking any NSAID: Aleve, Naproxen, Ibuprofen, Motrin, Advil, Goody's, BC's, all herbal medications, fish oil, and all vitamins.    The Morning of Surgery Do not wear jewelry, make-up or nail polish. Do not wear lotions, powders, or perfumes, or deodorant Do not shave 48 hours prior to surgery.   Do not bring valuables to the hospital. [facility] is not responsible for any belongings or valuables.  If you are a smoker, DO NOT Smoke 24 hours prior to surgery  If you wear a CPAP at night please bring your mask the morning of surgery   Remember that you must have someone to transport you home after your surgery, and remain with you for 24 hours if you are discharged the same day.  Please bring cases for contacts, glasses, hearing aids, dentures or bridgework  because it cannot be worn into surgery.   Patients discharged the day of surgery will not be allowed to drive home.   Please shower the NIGHT BEFORE/MORNING OF SURGERY (use antibacterial soap like DIAL soap if possible). Wear comfortable clothes the morning of surgery. Oral Hygiene is also important to reduce your risk of infection.  Remember - BRUSH YOUR TEETH THE MORNING OF SURGERY WITH YOUR REGULAR TOOTHPASTE  Patient denies shortness of breath, fever, cough and chest pain.

## 2020-12-11 ENCOUNTER — Other Ambulatory Visit (HOSPITAL_COMMUNITY): Payer: Self-pay

## 2020-12-11 ENCOUNTER — Other Ambulatory Visit (HOSPITAL_COMMUNITY)
Admission: RE | Admit: 2020-12-11 | Discharge: 2020-12-11 | Disposition: A | Discharge status: Home / Self Care | Payer: BC Managed Care – PPO | Source: Ambulatory Visit | Attending: Interventional Radiology | Admitting: Interventional Radiology

## 2020-12-11 DIAGNOSIS — I729 Aneurysm of unspecified site: Secondary | ICD-10-CM

## 2020-12-14 ENCOUNTER — Other Ambulatory Visit (HOSPITAL_COMMUNITY)
Admission: RE | Admit: 2020-12-14 | Discharge: 2020-12-14 | Disposition: A | Discharge status: Home / Self Care | Payer: BC Managed Care – PPO | Source: Ambulatory Visit | Attending: Interventional Radiology | Admitting: Interventional Radiology

## 2020-12-14 DIAGNOSIS — Z01812 Encounter for preprocedural laboratory examination: Secondary | ICD-10-CM | POA: Insufficient documentation

## 2020-12-14 DIAGNOSIS — Z20822 Contact with and (suspected) exposure to covid-19: Secondary | ICD-10-CM | POA: Insufficient documentation

## 2020-12-14 LAB — SARS CORONAVIRUS 2 (TAT 6-24 HRS): SARS Coronavirus 2: NEGATIVE

## 2020-12-15 ENCOUNTER — Other Ambulatory Visit (HOSPITAL_COMMUNITY): Payer: Self-pay | Admitting: Physician Assistant

## 2020-12-15 ENCOUNTER — Other Ambulatory Visit: Payer: Self-pay | Admitting: Radiology

## 2020-12-15 LAB — PLATELET INHIBITION P2Y12

## 2020-12-16 ENCOUNTER — Ambulatory Visit (HOSPITAL_COMMUNITY)
Admission: RE | Admit: 2020-12-16 | Discharge: 2020-12-16 | Disposition: A | Discharge status: Home / Self Care | Payer: BC Managed Care – PPO | Source: Ambulatory Visit | Attending: Interventional Radiology | Admitting: Interventional Radiology

## 2020-12-16 ENCOUNTER — Other Ambulatory Visit: Payer: Self-pay

## 2020-12-16 ENCOUNTER — Ambulatory Visit (HOSPITAL_COMMUNITY): Payer: BC Managed Care – PPO | Admitting: Anesthesiology

## 2020-12-16 ENCOUNTER — Inpatient Hospital Stay (HOSPITAL_COMMUNITY)
Admission: RE | Admit: 2020-12-16 | Discharge: 2020-12-17 | DRG: 027 | Disposition: A | Discharge status: Home / Self Care | Payer: BC Managed Care – PPO | Attending: Interventional Radiology | Admitting: Interventional Radiology

## 2020-12-16 ENCOUNTER — Encounter (HOSPITAL_COMMUNITY)
Admission: RE | Disposition: A | Discharge status: Home / Self Care | Payer: Self-pay | Source: Home / Self Care | Attending: Interventional Radiology

## 2020-12-16 ENCOUNTER — Encounter (HOSPITAL_COMMUNITY): Payer: Self-pay | Admitting: Interventional Radiology

## 2020-12-16 DIAGNOSIS — I671 Cerebral aneurysm, nonruptured: Secondary | ICD-10-CM | POA: Diagnosis present

## 2020-12-16 DIAGNOSIS — I606 Nontraumatic subarachnoid hemorrhage from other intracranial arteries: Secondary | ICD-10-CM | POA: Diagnosis present

## 2020-12-16 DIAGNOSIS — I10 Essential (primary) hypertension: Secondary | ICD-10-CM | POA: Diagnosis present

## 2020-12-16 DIAGNOSIS — Z20822 Contact with and (suspected) exposure to covid-19: Secondary | ICD-10-CM | POA: Diagnosis present

## 2020-12-16 DIAGNOSIS — Z7982 Long term (current) use of aspirin: Secondary | ICD-10-CM

## 2020-12-16 DIAGNOSIS — Z820 Family history of epilepsy and other diseases of the nervous system: Secondary | ICD-10-CM | POA: Diagnosis not present

## 2020-12-16 DIAGNOSIS — Z95820 Peripheral vascular angioplasty status with implants and grafts: Secondary | ICD-10-CM

## 2020-12-16 DIAGNOSIS — H0520 Unspecified exophthalmos: Secondary | ICD-10-CM | POA: Diagnosis present

## 2020-12-16 DIAGNOSIS — Z7902 Long term (current) use of antithrombotics/antiplatelets: Secondary | ICD-10-CM | POA: Diagnosis not present

## 2020-12-16 DIAGNOSIS — H532 Diplopia: Secondary | ICD-10-CM | POA: Diagnosis present

## 2020-12-16 DIAGNOSIS — Z79899 Other long term (current) drug therapy: Secondary | ICD-10-CM | POA: Diagnosis not present

## 2020-12-16 HISTORY — DX: Essential (primary) hypertension: I10

## 2020-12-16 HISTORY — PX: RADIOLOGY WITH ANESTHESIA: SHX6223

## 2020-12-16 HISTORY — PX: IR CT HEAD LTD: IMG2386

## 2020-12-16 HISTORY — PX: IR TRANSCATH/EMBOLIZ: IMG695

## 2020-12-16 LAB — HEPARIN LEVEL (UNFRACTIONATED): Heparin Unfractionated: <0.1 IU/mL — ABNORMAL LOW (ref 0.30–0.70)

## 2020-12-16 LAB — POCT ACTIVATED CLOTTING TIME
Activated Clotting Time: 173 seconds
Activated Clotting Time: 196 seconds
Activated Clotting Time: 231 seconds

## 2020-12-16 LAB — CBC WITH DIFFERENTIAL/PLATELET
Abs Immature Granulocytes: 0.02 10*3/uL (ref 0.00–0.07)
Basophils Absolute: 0.1 10*3/uL (ref 0.0–0.1)
Basophils Relative: 1 %
Eosinophils Absolute: 0.1 10*3/uL (ref 0.0–0.5)
Eosinophils Relative: 2 %
HCT: 38.9 % (ref 36.0–46.0)
Hemoglobin: 12.4 g/dL (ref 12.0–15.0)
Immature Granulocytes: 0 %
Lymphocytes Relative: 26 %
Lymphs Abs: 1.8 10*3/uL (ref 0.7–4.0)
MCH: 27.9 pg (ref 26.0–34.0)
MCHC: 31.9 g/dL (ref 30.0–36.0)
MCV: 87.4 fL (ref 80.0–100.0)
Monocytes Absolute: 0.4 10*3/uL (ref 0.1–1.0)
Monocytes Relative: 6 %
Neutro Abs: 4.5 10*3/uL (ref 1.7–7.7)
Neutrophils Relative %: 65 %
Platelets: 260 10*3/uL (ref 150–400)
RBC: 4.45 MIL/uL (ref 3.87–5.11)
RDW: 14.6 % (ref 11.5–15.5)
WBC: 7 10*3/uL (ref 4.0–10.5)
nRBC: 0 % (ref 0.0–0.2)

## 2020-12-16 LAB — BASIC METABOLIC PANEL
Anion gap: 12 (ref 5–15)
BUN: 13 mg/dL (ref 8–23)
CO2: 27 mmol/L (ref 22–32)
Calcium: 9 mg/dL (ref 8.9–10.3)
Chloride: 101 mmol/L (ref 98–111)
Creatinine, Ser: 0.92 mg/dL (ref 0.44–1.00)
GFR, Estimated: >60 mL/min (ref >60)
Glucose, Bld: 108 mg/dL — ABNORMAL HIGH (ref 70–99)
Potassium: 3.3 mmol/L — ABNORMAL LOW (ref 3.5–5.1)
Sodium: 140 mmol/L (ref 135–145)

## 2020-12-16 LAB — PROTIME-INR
INR: 1 (ref 0.8–1.2)
Prothrombin Time: 12.9 seconds (ref 11.4–15.2)

## 2020-12-16 LAB — MRSA NEXT GEN BY PCR, NASAL: MRSA by PCR Next Gen: NOT DETECTED

## 2020-12-16 LAB — APTT: aPTT: 52 seconds — ABNORMAL HIGH (ref 24–36)

## 2020-12-16 SURGERY — RADIOLOGY WITH ANESTHESIA
Anesthesia: General

## 2020-12-16 MED ORDER — DEXAMETHASONE SODIUM PHOSPHATE 10 MG/ML IJ SOLN
INTRAMUSCULAR | Status: DC | PRN
  Administered 2020-12-16: 4 mg via INTRAVENOUS

## 2020-12-16 MED ORDER — CLEVIDIPINE BUTYRATE 0.5 MG/ML IV EMUL
0.0000 mg/h | INTRAVENOUS | Status: AC
  Administered 2020-12-16: 6 mg/h via INTRAVENOUS
  Administered 2020-12-16: 1 mg/h via INTRAVENOUS
  Administered 2020-12-16 (×2): 8 mg/h via INTRAVENOUS
  Administered 2020-12-17: 4 mg/h via INTRAVENOUS
  Filled 2020-12-16: qty 50
  Filled 2020-12-16: qty 100
  Filled 2020-12-16: qty 50

## 2020-12-16 MED ORDER — ASPIRIN EC 325 MG PO TBEC
325.0000 mg | DELAYED_RELEASE_TABLET | ORAL | Status: DC
  Filled 2020-12-16: qty 1

## 2020-12-16 MED ORDER — ROCURONIUM BROMIDE 10 MG/ML (PF) SYRINGE
PREFILLED_SYRINGE | INTRAVENOUS | Status: DC | PRN
  Administered 2020-12-16: 40 mg via INTRAVENOUS
  Administered 2020-12-16: 20 mg via INTRAVENOUS
  Administered 2020-12-16: 60 mg via INTRAVENOUS

## 2020-12-16 MED ORDER — ACETAMINOPHEN 160 MG/5ML PO SOLN: 650.0000 mg | ORAL | Status: DC | PRN

## 2020-12-16 MED ORDER — LACTATED RINGERS IV SOLN: INTRAVENOUS | Status: DC | PRN

## 2020-12-16 MED ORDER — HEPARIN (PORCINE) 25000 UT/250ML-% IV SOLN
600.0000 [IU]/h | INTRAVENOUS | Status: DC
  Administered 2020-12-16: 600 [IU]/h via INTRAVENOUS
  Filled 2020-12-16: qty 250

## 2020-12-16 MED ORDER — CHLORHEXIDINE GLUCONATE 0.12 % MT SOLN
15.0000 mL | Freq: Once | OROMUCOSAL | Status: AC
  Administered 2020-12-16: 15 mL via OROMUCOSAL
  Filled 2020-12-16: qty 15

## 2020-12-16 MED ORDER — ACETAMINOPHEN 325 MG PO TABS: 650.0000 mg | ORAL_TABLET | ORAL | Status: DC | PRN

## 2020-12-16 MED ORDER — ASPIRIN 81 MG PO CHEW: 81.0000 mg | CHEWABLE_TABLET | Freq: Every day | ORAL | Status: DC

## 2020-12-16 MED ORDER — LIDOCAINE 2% (20 MG/ML) 5 ML SYRINGE
INTRAMUSCULAR | Status: DC | PRN
  Administered 2020-12-16: 60 mg via INTRAVENOUS

## 2020-12-16 MED ORDER — CLEVIDIPINE BUTYRATE 0.5 MG/ML IV EMUL
INTRAVENOUS | Status: AC
  Filled 2020-12-16: qty 50

## 2020-12-16 MED ORDER — TICAGRELOR 90 MG PO TABS
90.0000 mg | ORAL_TABLET | Freq: Two times a day (BID) | ORAL | Status: DC
  Administered 2020-12-16: 90 mg
  Filled 2020-12-16: qty 1

## 2020-12-16 MED ORDER — ACETAMINOPHEN 650 MG RE SUPP: 650.0000 mg | RECTAL | Status: DC | PRN

## 2020-12-16 MED ORDER — TICAGRELOR 90 MG PO TABS
90.0000 mg | ORAL_TABLET | Freq: Two times a day (BID) | ORAL | Status: DC
  Administered 2020-12-17: 90 mg via ORAL
  Filled 2020-12-16 (×2): qty 1

## 2020-12-16 MED ORDER — PHENYLEPHRINE 40 MCG/ML (10ML) SYRINGE FOR IV PUSH (FOR BLOOD PRESSURE SUPPORT)
PREFILLED_SYRINGE | INTRAVENOUS | Status: DC | PRN
  Administered 2020-12-16 (×2): 40 ug via INTRAVENOUS

## 2020-12-16 MED ORDER — ORAL CARE MOUTH RINSE: 15.0000 mL | Freq: Once | OROMUCOSAL | Status: AC

## 2020-12-16 MED ORDER — TICAGRELOR 90 MG PO TABS
ORAL_TABLET | ORAL | Status: AC
  Administered 2020-12-16: 90 mg via ORAL
  Filled 2020-12-16: qty 1

## 2020-12-16 MED ORDER — FENTANYL CITRATE (PF) 250 MCG/5ML IJ SOLN
INTRAMUSCULAR | Status: DC | PRN
  Administered 2020-12-16: 100 ug via INTRAVENOUS

## 2020-12-16 MED ORDER — ASPIRIN 81 MG PO CHEW
81.0000 mg | CHEWABLE_TABLET | Freq: Every day | ORAL | Status: DC
  Administered 2020-12-17: 81 mg via ORAL
  Filled 2020-12-16: qty 1

## 2020-12-16 MED ORDER — CEFAZOLIN SODIUM-DEXTROSE 2-4 GM/100ML-% IV SOLN
2.0000 g | INTRAVENOUS | Status: AC
  Administered 2020-12-16: 2 g via INTRAVENOUS
  Filled 2020-12-16: qty 100

## 2020-12-16 MED ORDER — SODIUM CHLORIDE 0.9 % IV SOLN: INTRAVENOUS | Status: DC

## 2020-12-16 MED ORDER — SUGAMMADEX SODIUM 200 MG/2ML IV SOLN
INTRAVENOUS | Status: DC | PRN
  Administered 2020-12-16: 200 mg via INTRAVENOUS

## 2020-12-16 MED ORDER — ESMOLOL HCL 100 MG/10ML IV SOLN
INTRAVENOUS | Status: DC | PRN
  Administered 2020-12-16 (×2): 40 mg via INTRAVENOUS

## 2020-12-16 MED ORDER — ACETAMINOPHEN 10 MG/ML IV SOLN
INTRAVENOUS | Status: DC | PRN
  Administered 2020-12-16: 1000 mg via INTRAVENOUS

## 2020-12-16 MED ORDER — HEPARIN (PORCINE) 25000 UT/250ML-% IV SOLN
INTRAVENOUS | Status: AC
  Filled 2020-12-16: qty 250

## 2020-12-16 MED ORDER — EPTIFIBATIDE 20 MG/10ML IV SOLN
INTRAVENOUS | Status: DC | PRN
  Administered 2020-12-16 (×2): 1.5 mg via INTRA_ARTERIAL

## 2020-12-16 MED ORDER — ACETAMINOPHEN 10 MG/ML IV SOLN
INTRAVENOUS | Status: AC
  Filled 2020-12-16: qty 100

## 2020-12-16 MED ORDER — FENTANYL CITRATE (PF) 100 MCG/2ML IJ SOLN
INTRAMUSCULAR | Status: AC
  Filled 2020-12-16: qty 2

## 2020-12-16 MED ORDER — EPTIFIBATIDE 20 MG/10ML IV SOLN
INTRAVENOUS | Status: AC
  Filled 2020-12-16: qty 10

## 2020-12-16 MED ORDER — FENTANYL CITRATE (PF) 100 MCG/2ML IJ SOLN: 25.0000 ug | INTRAMUSCULAR | Status: DC | PRN

## 2020-12-16 MED ORDER — PROPOFOL 10 MG/ML IV BOLUS
INTRAVENOUS | Status: DC | PRN
  Administered 2020-12-16: 100 mg via INTRAVENOUS
  Administered 2020-12-16 (×2): 50 mg via INTRAVENOUS

## 2020-12-16 MED ORDER — PROMETHAZINE HCL 25 MG/ML IJ SOLN: 6.2500 mg | INTRAMUSCULAR | Status: DC | PRN

## 2020-12-16 MED ORDER — OXYCODONE HCL 5 MG/5ML PO SOLN: 5.0000 mg | Freq: Once | ORAL | Status: DC | PRN

## 2020-12-16 MED ORDER — PHENYLEPHRINE HCL-NACL 20-0.9 MG/250ML-% IV SOLN
INTRAVENOUS | Status: DC | PRN
  Administered 2020-12-16: 25 ug/min via INTRAVENOUS

## 2020-12-16 MED ORDER — ONDANSETRON HCL 4 MG/2ML IJ SOLN
INTRAMUSCULAR | Status: DC | PRN
  Administered 2020-12-16: 4 mg via INTRAVENOUS

## 2020-12-16 MED ORDER — HEPARIN (PORCINE) 25000 UT/250ML-% IV SOLN
500.0000 [IU]/h | INTRAVENOUS | Status: DC
  Administered 2020-12-16: 500 [IU]/h via INTRAVENOUS

## 2020-12-16 MED ORDER — CHLORHEXIDINE GLUCONATE CLOTH 2 % EX PADS
6.0000 | MEDICATED_PAD | Freq: Every day | CUTANEOUS | Status: DC
  Administered 2020-12-16 – 2020-12-17 (×2): 6 via TOPICAL

## 2020-12-16 MED ORDER — NITROGLYCERIN 1 MG/10 ML FOR IR/CATH LAB
INTRA_ARTERIAL | Status: AC
  Filled 2020-12-16: qty 10

## 2020-12-16 MED ORDER — OXYCODONE HCL 5 MG PO TABS: 5.0000 mg | ORAL_TABLET | Freq: Once | ORAL | Status: DC | PRN

## 2020-12-16 MED ORDER — NIMODIPINE 30 MG PO CAPS
0.0000 mg | ORAL_CAPSULE | ORAL | Status: DC
Start: 2020-12-16 — End: 2020-12-16
  Filled 2020-12-16: qty 2

## 2020-12-16 MED ORDER — HEPARIN (PORCINE) 25000 UT/250ML-% IV SOLN
500.0000 [IU]/h | INTRAVENOUS | Status: DC
  Filled 2020-12-16: qty 250

## 2020-12-16 MED ORDER — GLYCOPYRROLATE PF 0.2 MG/ML IJ SOSY
PREFILLED_SYRINGE | INTRAMUSCULAR | Status: DC | PRN
  Administered 2020-12-16: .2 mg via INTRAVENOUS

## 2020-12-16 MED ORDER — HEPARIN SODIUM (PORCINE) 1000 UNIT/ML IJ SOLN
INTRAMUSCULAR | Status: DC | PRN
  Administered 2020-12-16: 1000 [IU] via INTRAVENOUS
  Administered 2020-12-16: 3000 [IU] via INTRAVENOUS
  Administered 2020-12-16: 1000 [IU] via INTRAVENOUS

## 2020-12-16 MED ORDER — IOHEXOL 240 MG/ML SOLN
50.0000 mL | Freq: Once | INTRAMUSCULAR | Status: AC
  Administered 2020-12-16: 100 mL via INTRAVENOUS

## 2020-12-16 MED ORDER — TICAGRELOR 90 MG PO TABS: 90.0000 mg | ORAL_TABLET | Freq: Once | ORAL | Status: AC

## 2020-12-16 NOTE — Anesthesia Procedure Notes (Signed)
Procedure Name: Intubation Date/Time: 12/16/2020 8:52 AM Performed by: Adria Dill, CRNA Pre-anesthesia Checklist: Patient identified, Emergency Drugs available, Suction available and Patient being monitored Patient Re-evaluated:Patient Re-evaluated prior to induction Oxygen Delivery Method: Circle system utilized Preoxygenation: Pre-oxygenation with 100% oxygen Induction Type: IV induction Ventilation: Mask ventilation without difficulty and Oral airway inserted - appropriate to patient size Laryngoscope Size: Hyacinth Meeker and 2 Grade View: Grade II Tube type: Oral Tube size: 7.0 mm Number of attempts: 1 Airway Equipment and Method: Stylet Placement Confirmation: ETT inserted through vocal cords under direct vision, positive ETCO2 and breath sounds checked- equal and bilateral Secured at: 22 cm Tube secured with: Tape Dental Injury: Teeth and Oropharynx as per pre-operative assessment

## 2020-12-16 NOTE — Progress Notes (Signed)
ANTICOAGULATION CONSULT NOTE - Initial Consult  Pharmacy Consult for Heparin Indication: s/p neuro IR procedure  No Known Allergies  Patient Measurements: Height: 5\' 7"  (170.2 cm) Weight: 85.7 kg (189 lb) IBW/kg (Calculated) : 61.6 Heparin Dosing Weight: 80 kg  Vital Signs: Temp: 97.3 F (36.3 C) (09/21 1240) Temp Source: Oral (09/21 0629) BP: 121/80 (09/21 1245) Pulse Rate: 89 (09/21 1245)  Labs: Recent Labs    12/16/20 0624  HGB 12.4  HCT 38.9  PLT 260  LABPROT 12.9  INR 1.0  CREATININE 0.92    Estimated Creatinine Clearance: 71.3 mL/min (by C-G formula based on SCr of 0.92 mg/dL).   Medical History: Past Medical History:  Diagnosis Date   Hypertension     Medications:  Infusions:   clevidipine     clevidipine     heparin     heparin 500 Units/hr (12/16/20 1155)    Assessment: 57 YOF who presented on 9/21 for planned L-ICA aneurysm embolization on heparin post-op with plans to continue thru 9/22 AM. Pharmacy consulted to manage heparin dosing.   Started at a rate of 500 units/hr in the OR - will continue the same and check a level this afternoon.   Goal of Therapy:  Heparin level 0.1-0.25 units/ml Monitor platelets by anticoagulation protocol: Yes   Plan:  - Continue Heparin at 500 units/hr - Will continue to monitor for any signs/symptoms of bleeding and will follow up with heparin level in 6 hours   Thank you for allowing pharmacy to be a part of this patient's care.  05-18-1976, PharmD, BCPS Clinical Pharmacist Clinical phone for 12/16/2020: [phone] 12/16/2020 12:51 PM   **Pharmacist phone directory can now be found on [email or web address] ([email or web address]).  Listed under [facility].

## 2020-12-16 NOTE — Progress Notes (Signed)
Neurointerventional Radiology Brief Note:  Patient assessed at bedside alongside Dr. Corliss Skains s/p placement of 2 surpass flow diverters across ruptured site of giant L ICA cavernous aneurysm.  Patient conversant doing well after procedure this morning. PERRL, 55mm bilaterally. Tongue midline, face symmetric. Left eye lid swelling noted, but still able to open eyes.  Denies blurry vision, but reports right lateral conjugate diplopia (side-by-side), as well as left lateral conjugate diplopia (top-to-bottom). Procedure site intact.  Small amount of bright red drainage on gauze.  Area marked without extension.  Compression yields no hematoma, or palpable abnormality.  Bleeding appears to have stopped.  Dressing to remain in place for now.   Patient to remain on heparin overnight.  BP goal 120-140, err to 120s if possible.  May elevated HOB at 4pm, but keep leg straight until at least 7pm. Restart bedrest protocol if signs of issues with groin.  RN aware.   Follow-up tomorrow morning for possible d/c home.   Loyce Dys, MS RD PA-C

## 2020-12-16 NOTE — Transfer of Care (Signed)
Immediate Anesthesia Transfer of Care Note  Patient: Rose Marquez  Procedure(s) Performed: RADIOLOGY WITH ANESTHESIA EMBOLIZATION  Patient Location: PACU  Anesthesia Type:General  Level of Consciousness: awake and patient cooperative  Airway & Oxygen Therapy: Patient Spontanous Breathing and Patient connected to nasal cannula oxygen  Post-op Assessment: Report given to RN, Post -op Vital signs reviewed and stable, Patient moving all extremities X 4 and Patient able to stick tongue midline  Post vital signs: Reviewed and stable  Last Vitals:  Vitals Value Taken Time  BP    Temp    Pulse    Resp    SpO2      Last Pain:  Vitals:   12/16/20 0715  TempSrc:   PainSc: 0-No pain         Complications: No notable events documented.

## 2020-12-16 NOTE — Anesthesia Procedure Notes (Signed)
Arterial Line Insertion Start/End9/21/2022 8:30 AM, 12/16/2020 8:30 AM Performed by: CRNA  Patient location: OOR procedure area. Preanesthetic checklist: patient identified, IV checked, site marked, risks and benefits discussed, surgical consent, monitors and equipment checked, pre-op evaluation, timeout performed and anesthesia consent Lidocaine 1% used for infiltration Left, radial was placed Catheter size: 20 G Hand hygiene performed , maximum sterile barriers used  and Seldinger technique used Allen's test indicative of satisfactory collateral circulation Attempts: 1 Procedure performed without using ultrasound guided technique. Following insertion, dressing applied and Biopatch. Post procedure assessment: normal  Patient tolerated the procedure well with no immediate complications.

## 2020-12-16 NOTE — Anesthesia Postprocedure Evaluation (Addendum)
Anesthesia Post Note  Patient: Rose Marquez  Procedure(s) Performed: RADIOLOGY WITH ANESTHESIA EMBOLIZATION     Patient location during evaluation: PACU Anesthesia Type: General Level of consciousness: sedated and patient cooperative Pain management: pain level controlled Vital Signs Assessment: post-procedure vital signs reviewed and stable Respiratory status: nonlabored ventilation, spontaneous breathing, respiratory function stable and patient connected to nasal cannula oxygen Cardiovascular status: stable and blood pressure returned to baseline Postop Assessment: no apparent nausea or vomiting Anesthetic complications: no   No notable events documented.  Last Vitals:  Vitals:   12/16/20 1345 12/16/20 1400  BP: 110/78 120/81  Pulse:    Resp: 14 16  Temp:    SpO2:      Last Pain:  Vitals:   12/16/20 1245  TempSrc:   PainSc: 5                  ,E. 

## 2020-12-16 NOTE — H&P (Signed)
Chief Complaint: Patient was seen in consultation today for Cerebral arteriogram with possible Left internal carotid artery aneurysm embolization    Supervising Physician: Julieanne Cotton  Patient Status: [facility] - Out-pt  History of Present Illness: Rose Marquez is a 60 y.o. female   Known to NIR L ICA giant aneurysm embolization- 2 pipeline stents placed 09/18/20 MRA 11/06/20: IMPRESSION: No acute infarction. Mild to moderate chronic small-vessel ischemic changes of the hemispheric white matter.      Pipeline stent in place through the left cavernous carotid region. Continued flow within a large cavernous region ICA aneurysm, today measuring about 2.4 x 1.3 x 1.3 cm. The configuration appears different than on the initial study of 09/16/2020, when it appeared more round. Today it is more ovoid.  Consult with Dr Corliss Skains 11/13/20:  Patient's most recent MRI scan and MRA scan of 11/04/2020 was reviewed and discussed with the patient.     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 However, interval remodeling of the giant aneurysm appears to have occurred with the continued flow within the aneurysm though. The size of the aneurysm is approximately 25 mm x 12 mm x 12 mm.    The left internal carotid artery extra cranially and intracranially appears widely patent.      Given the above clinical and neuroimaging findings, and in order to prevent potential for visual loss in the left eye due to venous hypertension, and also to expedite further thrombosis within the aneurysm, it was decided to schedule patient for a diagnostic cerebral arteriogram with intent to treat with general anesthesia.  Scheduled now for cerebral arteriogram with possible embolization of L ICA aneurysm. She is aware of procedure  She denies N/V Still some pain in top of head-- occasionally Double visio only occurs if focused intently on something Left eye is much less swollen  but still reddened. Speech is slow but deliberate Swallow is fine Denies numbness or tingling Taking Brilinta (.25 daily) and ASA 81 mg daily  P2y12: 60   Past Medical History:  Diagnosis Date   Hypertension     Past Surgical History:  Procedure Laterality Date   CHOLECYSTECTOMY     IR 3D INDEPENDENT WKST  09/18/2020   IR ANGIO INTRA EXTRACRAN SEL COM CAROTID INNOMINATE BILAT MOD SED  09/18/2020   IR ANGIO INTRA EXTRACRAN SEL COM CAROTID INNOMINATE BILAT MOD SED  09/17/2020   IR ANGIO VERTEBRAL SEL VERTEBRAL UNI R MOD SED  09/18/2020   IR ANGIO VERTEBRAL SEL VERTEBRAL UNI R MOD SED  09/17/2020   IR CT HEAD LTD  09/18/2020   IR RADIOLOGIST EVAL & MGMT  10/12/2020   IR RADIOLOGIST EVAL & MGMT  11/18/2020   IR TRANSCATH/EMBOLIZ  09/18/2020   IR US GUIDE VASC ACCESS RIGHT  09/18/2020   IR US GUIDE VASC ACCESS RIGHT  09/17/2020   RADIOLOGY WITH ANESTHESIA N/A 09/18/2020   Procedure: IR WITH ANESTHESIA ANEURYSM EMBOLIZATION;  Surgeon: Julieanne Cotton, MD;  Location: [facility] OR;  Service: Radiology;  Laterality: N/A;    Allergies: Patient has no known allergies.  Medications: Prior to Admission medications   Medication Sig Start Date End Date Taking? Authorizing Provider  amLODipine (NORVASC) 10 MG tablet Take 1 tablet (10 mg total) by mouth daily. 09/23/20  Yes Louk, Alexandra M, PA-C  aspirin 81 MG chewable tablet Chew 1 tablet (81 mg total) by mouth daily. 09/23/20  Yes Louk, Alexandra M, PA-C  hydrochlorothiazide (HYDRODIURIL) 25 MG tablet Take  1 tablet (25 mg total) by mouth 2 (two) times daily. 09/22/20  Yes Louk, Alexandra M, PA-C  HYDROcodone-acetaminophen (NORCO/VICODIN) 5-325 MG tablet Take 1 tablet by mouth every 6 (six) hours as needed for moderate pain or severe pain. 10/26/20  Yes Han, Aimee H, PA-C  Multiple Vitamin (MULTIVITAMIN WITH MINERALS) TABS tablet Take 1 tablet by mouth daily.   Yes [provider]  ticagrelor (BRILINTA) 90 MG TABS tablet Take 1 tablet  (90 mg total) by mouth 2 (two) times daily. Patient taking differently: Take 22.5 mg by mouth daily. 09/22/20  Yes Louk, Waylan Boga, PA-C     Family History  Problem Relation Age of Onset   Alzheimer's disease Father     Social History   Socioeconomic History   Marital status: Single    Spouse name: Not on file   Number of children: Not on file   Years of education: Not on file   Highest education level: Not on file  Occupational History   Not on file  Tobacco Use   Smoking status: Never   Smokeless tobacco: Never  Vaping Use   Vaping Use: Never used  Substance and Sexual Activity   Alcohol use: Never   Drug use: Never   Sexual activity: Not on file  Other Topics Concern   Not on file  Social History Narrative   Not on file   Social Determinants of Health   Financial Resource Strain: Not on file  Food Insecurity: Not on file  Transportation Needs: Not on file  Physical Activity: Not on file  Stress: Not on file  Social Connections: Not on file    Review of Systems: A 12 point ROS discussed and pertinent positives are indicated in the HPI above.  All other systems are negative.  Review of Systems  Constitutional:  Negative for activity change, fatigue, fever and unexpected weight change.  HENT:  Negative for ear pain, facial swelling, sore throat, tinnitus and trouble swallowing.   Eyes:  Positive for visual disturbance.  Respiratory:  Negative for cough and shortness of breath.   Cardiovascular:  Negative for chest pain.  Gastrointestinal:  Negative for abdominal pain, diarrhea, nausea and vomiting.  Musculoskeletal:  Negative for back pain and gait problem.  Neurological:  Positive for speech difficulty and headaches. Negative for dizziness, tremors, seizures, syncope, facial asymmetry, weakness, light-headedness and numbness.  Psychiatric/Behavioral:  Negative for behavioral problems and confusion.    Vital Signs: BP (!) 131/94   Pulse 85   Temp 98.3 F  (36.8 C) (Oral)   Resp 17   Ht 5\' 7"  (1.702 m)   Wt 189 lb (85.7 kg)   SpO2 100%   BMI 29.60 kg/m   Physical Exam Vitals reviewed.  Constitutional:      Comments: Face is symmetrical Left eye is less swollen Reddened sclera-- but better than 2 mo ago Smile = Tongue midline  HENT:     Mouth/Throat:     Mouth: Mucous membranes are moist.  Eyes:     Extraocular Movements: Extraocular movements intact.     Pupils: Pupils are equal, round, and reactive to light.     Comments: Double vision with far right gaze Otherwise wnl  Cardiovascular:     Rate and Rhythm: Normal rate and regular rhythm.     Heart sounds: Normal heart sounds.  Pulmonary:     Effort: Pulmonary effort is normal.     Breath sounds: Normal breath sounds.  Abdominal:  Palpations: Abdomen is soft.     Tenderness: There is abdominal tenderness.  Musculoskeletal:        General: Normal range of motion.     Cervical back: Normal range of motion.     Right lower leg: No edema.     Left lower leg: No edema.  Skin:    General: Skin is warm.  Neurological:     Mental Status: She is alert and oriented to person, place, and time.  Psychiatric:        Behavior: Behavior normal.        Judgment: Judgment normal.    Imaging: IR Radiologist Eval & Mgmt  Result Date: 11/18/2020 EXAM: ESTABLISHED PATIENT OFFICE VISIT CHIEF COMPLAINT: Diplopia.  Blurred vision in the left eye. 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 Patient had an uneventful course. Patient did notice modest improvement in her headache, and also in the chemosis and proptosis in the left eye at that time. Her Brilinta was cut in half in order to promote thrombosis within the giant aneurysm approximately a month ago. She  returns today accompanied by a close friend. Clinically, the patient reports significant improvement in the left-sided supra orbital, retro-orbital and temporal headaches. She reports these are approximately 80% better. These headaches usually respond to simple over-the-counter analgesics. She also reports that the numbness over the left temporal orbital region has also receded somewhat though still is there. She continues to have double vision with blurring when seeing through the left eye. There is also some retro-orbital pain associated with eye movement. She continues to have chemosis which appears more prominent compared to the examination of her follow-up visit on 10/09/2020. Otherwise, constitutionally, the patient remains fully active. She denies any nausea, vomiting, right-sided numbness, tingling, paresthesias, weakness or speech difficulties. She denies any gait abnormalities either. She denies any episodes of loss of awareness or seizure-like activity. Patient reports she is able to sleep at night most of the time. She denies being woken up with headaches. She denies any recent chills, fever or rigors. Past Medical History: Unchanged Medications: Unchanged Allergies: Unchanged Social History: Unchanged Family History:  Unchanged REVIEW OF SYSTEMS: As above. PHYSICAL EXAMINATION: Focused examination reveals edematous upper and lower eyelids slightly extending to the left eyebrow. Obvious chemosis and proptosis. Pupils are mostly equal. React to light directly and consensually bilaterally. Decreased abduction on lateral gaze of the left eye, and also adduction in the right eye on looking to the right. Evidence of nystagmus. ASSESSMENT AND PLAN: Patient's most recent MRI scan and MRA scan of 11/04/2020 was reviewed and discussed with the patient. 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 However, interval remodeling of the giant aneurysm appears to have occurred with the continued  flow within the aneurysm though. The size of the aneurysm is approximately 25 mm x 12 mm x 12 mm. The left internal carotid artery extra cranially and intracranially appears widely patent. Given the above clinical and neuroimaging findings, and in order to prevent potential for visual loss in the left eye due to venous hypertension, and also to expedite further thrombosis within the aneurysm, it was decided to schedule patient for a diagnostic cerebral arteriogram with intent to treat with general anesthesia. Patient, in the meantime, was advised to continue taking her aspirin  81 mg a day, and Brilinta 45 mg bid. she was also advised to continue with her antihypertensive medications. This will be scheduled approximately in the middle of September. Patient was advised to continue maintaining adequate hydration as well. A platelet inhibition test P2Y12 will be obtained prior to the planned treatment. Questions were answered to her satisfaction. Patient left with good understanding and agreement with the above management plan. Electronically Signed   By: Julieanne Cotton M.D.   On: 11/17/2020 10:35    Labs:  CBC: Recent Labs    09/16/20 1336 09/18/20 0436 09/19/20 0211 12/16/20 0624  WBC 10.3 8.6 11.6* 7.0  HGB 13.9 13.5 12.0 12.4  HCT 42.0 40.8 35.8* 38.9  PLT 161 169 165 260    COAGS: Recent Labs    09/18/20 0436 12/16/20 0624  INR 1.1 1.0    BMP: Recent Labs    09/18/20 0436 09/19/20 0211 09/22/20 0254 12/16/20 0624  NA 137 135 137 140  K 3.7 3.5 3.5 3.3*  CL 105 106 99 101  CO2 24 22 29 27   GLUCOSE 102* 125* 154* 108*  BUN 8 7* 7* 13  CALCIUM 8.6* 8.2* 9.3 9.0  CREATININE 0.73 0.69 0.95 0.92  GFRNONAA >60 >60 >60 >60    LIVER FUNCTION TESTS: No results for input(s): BILITOT, AST, ALT, ALKPHOS, PROT, ALBUMIN in the last 8760 hours.  TUMOR MARKERS: No results for input(s): AFPTM, CEA, CA199, CHROMGRNA in the last 8760 hours.  Assessment and Plan:  L ICA giant  aneurysm Treatment with 2 Pipeline stents placed 09/18/20 MRA 11/06/20 revealing remodeling of aneurysm Now scheduled for arteriogram and additional embolization of left internal carotid artery aneurysm  Risks and benefits of cerebral angiogram with intervention were discussed with the patient including, but not limited to bleeding, infection, vascular injury, contrast induced renal failure, stroke or even death.  This interventional procedure involves the use of X-rays and because of the nature of the planned procedure, it is possible that we will have prolonged use of X-ray fluoroscopy.  Potential radiation risks to you include (but are not limited to) the following: - A slightly elevated risk for cancer  several years later in life. This risk is typically less than 0.5% percent. This risk is low in comparison to the normal incidence of human cancer, which is 33% for women and 50% for men according to the [employer] Society. - Radiation induced injury can include skin redness, resembling a rash, tissue breakdown / ulcers and hair loss (which can be temporary or permanent).   The likelihood of either of these occurring depends on the difficulty of the procedure and whether you are sensitive to radiation due to previous procedures, disease, or genetic conditions.   IF your procedure requires a prolonged use of radiation, you will be notified and given written instructions for further action.  It is your responsibility to monitor the irradiated area for the 2 weeks following the procedure and to notify your physician if you are concerned that you have suffered a radiation induced injury.    All of the patient's questions were answered, patient is agreeable to proceed.  Consent signed and in chart.   Thank you for this interesting consult.  I greatly enjoyed meeting Rose Marquez and look forward to participating in their care.  A copy of this report was sent to the requesting provider on  this date.  Electronically Signed: Lendon Colonel, PA-C 12/16/2020, 8:13 AM   I spent a total  of    25 Minutes in face to face in clinical consultation, greater than 50% of which was counseling/coordinating care for cerebral arteriogram with L ICA aneurysm embolization

## 2020-12-16 NOTE — Progress Notes (Signed)
ANTICOAGULATION CONSULT NOTE - Follow Up Consult  Pharmacy Consult for Heparin Indication: s/p neuro IR procedure  No Known Allergies  Patient Measurements: Height: 5\' 7"  (170.2 cm) Weight: 85.7 kg (189 lb) IBW/kg (Calculated) : 61.6 Heparin Dosing Weight: 80 kg  Vital Signs: Temp: 98.7 F (37.1 C) (09/21 2000) Temp Source: Oral (09/21 2000) BP: 112/79 (09/21 1900) Pulse Rate: 89 (09/21 1245)  Labs: Recent Labs    12/16/20 0624 12/16/20 1641  HGB 12.4  --   HCT 38.9  --   PLT 260  --   APTT  --  52*  LABPROT 12.9  --   INR 1.0  --   HEPARINUNFRC  --  <0.10*  CREATININE 0.92  --      Estimated Creatinine Clearance: 71.3 mL/min (by C-G formula based on SCr of 0.92 mg/dL).   Medical History: Past Medical History:  Diagnosis Date   Hypertension     Medications:  Infusions:   sodium chloride 75 mL/hr at 12/16/20 1800   clevidipine     clevidipine 8 mg/hr (12/16/20 2004)   heparin      Assessment: 49 YOF who presented on 9/21 for planned L-ICA aneurysm embolization on heparin post-op with plans to continue thru 9/22 AM. Pharmacy consulted to manage heparin dosing.   Heparin level undetectable on heparin 500 units/hr. Patient noted to have small amount of bleeding from dressing this afternoon which has since resolved. No other bleeding noted. No issues with heparin infusion.   Goal of Therapy:  Heparin level 0.1-0.25 units/ml Monitor platelets by anticoagulation protocol: Yes   Plan:  Increase heparin to 600 units/hr   Thank you for allowing pharmacy to be a part of this patient's care.  05-18-1976, PharmD, BCPS Clinical Pharmacist 12/16/2020 8:56 PM

## 2020-12-16 NOTE — Procedures (Addendum)
S/P RT [facility] and Bilateral common carotid arteriograms followed by placement of 2 surpass flow diverters across ruptured site of giant LT ICA cavernous aneurysm. Post CT brain No ICH. Extubated. Denies any H/As,N/V  Moving all 4s spontaneously. Following simple commands appropriately. Oriented to place and year. Pupils 35mm Rt =Lt Sig decreases chemosis in the Lt eye. No facial asymmetry. Tongue midline. RT groin hemostasis wit 68F angioseal closure device. Distal ,pulses all intact. S. MD

## 2020-12-16 NOTE — Anesthesia Preprocedure Evaluation (Addendum)
Anesthesia Evaluation  Patient identified by MRN, date of birth, ID band Patient awake    Reviewed: Allergy & Precautions, NPO status , Patient's Chart, lab work & pertinent test results  History of Anesthesia Complications Negative for: history of anesthetic complications  Airway Mallampati: III  TM Distance: >3 FB Neck ROM: Full    Dental  (+) Dental Advisory Given, Teeth Intact   Pulmonary neg pulmonary ROS,    Pulmonary exam normal        Cardiovascular hypertension, Pt. on medications Normal cardiovascular exam   '22 TTE - EF 65 to 70%. Moderate concentric left ventricular hypertrophy. Left ventricular diastolic parameters are consistent with Grade I diastolic dysfunction (impaired relaxation). Left atrial size was mildly dilated.     Neuro/Psych  Brain aneurysm  CVA, Residual Symptoms negative psych ROS   GI/Hepatic negative GI ROS, Neg liver ROS,   Endo/Other  negative endocrine ROS  Renal/GU negative Renal ROS     Musculoskeletal negative musculoskeletal ROS (+)   Abdominal   Peds  Hematology  On brilinta    Anesthesia Other Findings Covid test negative   Reproductive/Obstetrics                            Anesthesia Physical Anesthesia Plan  ASA: 3  Anesthesia Plan: General   Post-op Pain Management:    Induction: Intravenous  PONV Risk Score and Plan: 3 and Treatment may vary due to age or medical condition, Ondansetron, Dexamethasone and Midazolam  Airway Management Planned: Oral ETT  Additional Equipment: Arterial line  Intra-op Plan:   Post-operative Plan: Extubation in OR  Informed Consent: I have reviewed the patients History and Physical, chart, labs and discussed the procedure including the risks, benefits and alternatives for the proposed anesthesia with the patient or authorized representative who has indicated his/her understanding and acceptance.      Dental advisory given  Plan Discussed with: CRNA and Anesthesiologist  Anesthesia Plan Comments:        Anesthesia Quick Evaluation

## 2020-12-16 NOTE — Sedation Documentation (Signed)
Right femoral sheath removed. 8Fr. Angioseal deployed 

## 2020-12-16 NOTE — Progress Notes (Signed)
New drainage found on patient's dressing at 1545 outside of previously marked area. Dressing removed and slow oozing of blood noted from site. Pressure held on site for 10 minutes. No blood oozing from site after holding pressure at 1600. Gauze and pressure dressing applied. VSS and mental status unchanged.  Dr. Corliss Skains made aware of small amount of bleeding and that bleeding has since stopped. Verbal orders given to lay patient flat for an hour and obtain stat aPTT. Patient transitioned from reverse trendelenburg to [facility] flat. Groin site clean, dry, and intact at this time with no further bleeding noted. aPTT sent to lab. Awaiting result.   Will continue to monitor.

## 2020-12-16 NOTE — Sedation Documentation (Signed)
Pt to IR 2 with CRNA. Awake and alert. In no distress. Pt noted to have large amount of swelling and redness to right eye. States this is not new. Dr Corliss Skains aware

## 2020-12-17 ENCOUNTER — Encounter (HOSPITAL_COMMUNITY): Payer: Self-pay | Admitting: Interventional Radiology

## 2020-12-17 LAB — CBC WITH DIFFERENTIAL/PLATELET
Abs Immature Granulocytes: 0.06 10*3/uL (ref 0.00–0.07)
Basophils Absolute: 0 10*3/uL (ref 0.0–0.1)
Basophils Relative: 0 %
Eosinophils Absolute: 0 10*3/uL (ref 0.0–0.5)
Eosinophils Relative: 0 %
HCT: 34.4 % — ABNORMAL LOW (ref 36.0–46.0)
Hemoglobin: 11.1 g/dL — ABNORMAL LOW (ref 12.0–15.0)
Immature Granulocytes: 1 %
Lymphocytes Relative: 10 %
Lymphs Abs: 1.2 10*3/uL (ref 0.7–4.0)
MCH: 28 pg (ref 26.0–34.0)
MCHC: 32.3 g/dL (ref 30.0–36.0)
MCV: 86.6 fL (ref 80.0–100.0)
Monocytes Absolute: 0.7 10*3/uL (ref 0.1–1.0)
Monocytes Relative: 5 %
Neutro Abs: 11 10*3/uL — ABNORMAL HIGH (ref 1.7–7.7)
Neutrophils Relative %: 84 %
Platelets: 242 10*3/uL (ref 150–400)
RBC: 3.97 MIL/uL (ref 3.87–5.11)
RDW: 14.8 % (ref 11.5–15.5)
WBC: 12.9 10*3/uL — ABNORMAL HIGH (ref 4.0–10.5)
nRBC: 0 % (ref 0.0–0.2)

## 2020-12-17 LAB — BASIC METABOLIC PANEL
Anion gap: 8 (ref 5–15)
BUN: 11 mg/dL (ref 8–23)
CO2: 24 mmol/L (ref 22–32)
Calcium: 8.6 mg/dL — ABNORMAL LOW (ref 8.9–10.3)
Chloride: 105 mmol/L (ref 98–111)
Creatinine, Ser: 0.79 mg/dL (ref 0.44–1.00)
GFR, Estimated: >60 mL/min (ref >60)
Glucose, Bld: 135 mg/dL — ABNORMAL HIGH (ref 70–99)
Potassium: 3.1 mmol/L — ABNORMAL LOW (ref 3.5–5.1)
Sodium: 137 mmol/L (ref 135–145)

## 2020-12-17 LAB — HEPARIN LEVEL (UNFRACTIONATED): Heparin Unfractionated: <0.1 IU/mL — ABNORMAL LOW (ref 0.30–0.70)

## 2020-12-17 MED ORDER — AMLODIPINE BESYLATE 10 MG PO TABS
10.0000 mg | ORAL_TABLET | Freq: Every day | ORAL | Status: DC
  Administered 2020-12-17: 10 mg via ORAL
  Filled 2020-12-17: qty 1

## 2020-12-17 MED ORDER — POTASSIUM CHLORIDE CRYS ER 20 MEQ PO TBCR
40.0000 meq | EXTENDED_RELEASE_TABLET | Freq: Once | ORAL | Status: AC
  Administered 2020-12-17: 40 meq via ORAL
  Filled 2020-12-17: qty 2

## 2020-12-17 MED ORDER — HEPARIN (PORCINE) 25000 UT/250ML-% IV SOLN
12.0000 [IU]/kg/h | INTRAVENOUS | Status: DC
  Filled 2020-12-17: qty 250

## 2020-12-17 MED ORDER — HYDROCHLOROTHIAZIDE 25 MG PO TABS
25.0000 mg | ORAL_TABLET | Freq: Two times a day (BID) | ORAL | Status: DC
  Administered 2020-12-17: 25 mg via ORAL
  Filled 2020-12-17: qty 1

## 2020-12-17 MED ORDER — HEPARIN (PORCINE) 25000 UT/250ML-% IV SOLN
700.0000 [IU]/h | INTRAVENOUS | Status: DC
  Administered 2020-12-17: 700 [IU]/h via INTRAVENOUS
  Filled 2020-12-17: qty 250

## 2020-12-17 NOTE — Progress Notes (Signed)
ANTICOAGULATION CONSULT NOTE - Follow Up Consult  Pharmacy Consult for Heparin Indication: s/p neuro IR procedure  No Known Allergies  Patient Measurements: Height: 5\' 7"  (170.2 cm) Weight: 85.7 kg (189 lb) IBW/kg (Calculated) : 61.6 Heparin Dosing Weight: 80 kg  Vital Signs: Temp: 98.8 F (37.1 C) (09/22 0000) Temp Source: Oral (09/22 0000) BP: 108/69 (09/22 0300) Pulse Rate: 95 (09/22 0300)  Labs: Recent Labs    12/16/20 0624 12/16/20 1641 12/17/20 0324  HGB 12.4  --  11.1*  HCT 38.9  --  34.4*  PLT 260  --  242  APTT  --  52*  --   LABPROT 12.9  --   --   INR 1.0  --   --   HEPARINUNFRC  --  <0.10* <0.10*  CREATININE 0.92  --   --      Estimated Creatinine Clearance: 71.3 mL/min (by C-G formula based on SCr of 0.92 mg/dL).   Medical History: Past Medical History:  Diagnosis Date   Hypertension     Medications:  Infusions:   sodium chloride 75 mL/hr at 12/17/20 0154   clevidipine 4 mg/hr (12/17/20 0332)   heparin      Assessment: 63 YOF who presented on 9/21 for planned L-ICA aneurysm embolization on heparin post-op with plans to continue thru 9/22 AM. Pharmacy consulted to manage heparin dosing.   Heparin level undetectable on heparin 500 units/hr. Patient noted to have small amount of bleeding from dressing this afternoon which has since resolved. No other bleeding noted. No issues with heparin infusion.   HL came back undetectable again. Will increase slightly with the same stop time today.   Goal of Therapy:  Heparin level 0.1-0.25 units/ml Monitor platelets by anticoagulation protocol: Yes   Plan:  Increase heparin to 700 units/hr - stop at [address], PharmD, [place], AAHIVP, CPP Infectious Disease Pharmacist 12/17/2020 4:05 AM

## 2020-12-17 NOTE — Discharge Summary (Signed)
Patient ID: Rose Marquez MRN: [id number] DOB/AGE: 10-25-1960 60 y.o.  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 She is s/p diagnostic cerebral arteriogram 09/17/20. 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 She was discharged home on 90 mg Brilinta BID and 81 mg Aspirin daily.   Rose Marquez followed up with Dr. Corliss Marquez as an outpatient 10/09/20. She continued to complain of blurred/double vision in the left eye. An MRI/MRA 11/04/20 revealed a prominent superior ophthalmic vein on the left side with interval remodeling of the giant aneurysm and continued flow with the aneurysm. During consultation with Dr. Corliss Marquez 11/13/20 she was presented with the option to re-treat the aneurysm to prevent potential vision loss in the left eye due to venous hypertension and to also expedite further thrombosis within the aneurysm. She was agreeable to this procedure and presented to the [facility] Neuro Interventional Radiology department 12/16/20 and underwent a diagnostic cerebral arteriogram with placements of two surpass flow diverters across ruptured site of giant left ICA cavernous aneurysm. Procedure was performed under general anesthesia with uneventful extubation  post-procedure. She was admitted to the Neuro ICU for overnight observation.   She was evaluated at the bedside by Dr. Corliss Marquez this morning. She states her blurred/double vision appears to have improved slightly compared to  pre-procedure. Her right femoral vascular site was found to be slightly oozing and a quik-clot pad was placed over the site followed by 15 minutes of manual pressure by the ICU RN.   The  patient has plans for an afternoon discharge after she has had a chance to work with PT/OT. She was given 90 mg of PO Brilinta this morning and she will take a 45 mg dose tonight. She will continue with 45 mg Brilinta BID for the next week and will reduce the dose to 22.5 mg BID after that. She will follow up with Dr. Corliss Marquez in approximately two weeks and a scheduler from our office will call her to arrange the date/time. She was advised to avoid bending, stooping and heavy lifting greater than 10 lb for two weeks. She was also advised to avoid driving for two weeks or until her double/blurred vision clears up. She knows she can call our office with any questions/concerns. Her daughter was at the bedside during our discharge assessment/teaching.    Consults: Pharmacy for anticoagulation - Brilinta   Significant Diagnostic Studies: IR Radiologist Eval & Mgmt  Result Date: 11/18/2020 EXAM: ESTABLISHED PATIENT OFFICE VISIT CHIEF COMPLAINT: Diplopia.  Blurred vision in the left eye. 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 Patient had an uneventful course. Patient did notice modest improvement in her headache, and also in the chemosis and proptosis in the left eye at that time. Her Brilinta was cut in half in order to promote thrombosis within the giant aneurysm approximately a month ago. She returns today accompanied by  a close friend. Clinically, the patient reports significant improvement in the left-sided supra orbital, retro-orbital and temporal headaches. She reports these are  approximately 80% better. These headaches usually respond to simple over-the-counter analgesics. She also reports that the numbness over the left temporal orbital region has also receded somewhat though still is there. She continues to have double vision with blurring when seeing through the left eye. There is also some retro-orbital pain associated with eye movement. She continues to have chemosis which appears more prominent compared to the examination of her follow-up visit on 10/09/2020. Otherwise, constitutionally, the patient remains fully active. She denies any nausea, vomiting, right-sided numbness, tingling, paresthesias, weakness or speech difficulties. She denies any gait abnormalities either. She denies any episodes of loss of awareness or seizure-like activity. Patient reports she is able to sleep at night most of the time. She denies being woken up with headaches. She denies any recent chills, fever or rigors. Past Medical History: Unchanged Medications: Unchanged Allergies: Unchanged Social History: Unchanged Family History:  Unchanged REVIEW OF SYSTEMS: As above. PHYSICAL EXAMINATION: Focused examination reveals edematous upper and lower eyelids slightly extending to the left eyebrow. Obvious chemosis and proptosis. Pupils are mostly equal. React to light directly and consensually bilaterally. Decreased abduction on lateral gaze of the left eye, and also adduction in the right eye on looking to the right. Evidence of nystagmus. ASSESSMENT AND PLAN: Patient's most recent MRI scan and MRA scan of 11/04/2020 was reviewed and discussed with the patient. 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 However, interval remodeling of the giant aneurysm appears to have occurred with the continued flow within the aneurysm though. The size of the aneurysm is approximately 25 mm x 12 mm x 12 mm. The left internal carotid artery extra cranially and intracranially appears widely patent. Given  the above clinical and neuroimaging findings, and in order to prevent potential for visual loss in the left eye due to venous hypertension, and also to expedite further thrombosis within the aneurysm, it was decided to schedule patient for a diagnostic cerebral arteriogram with intent to treat with general anesthesia. Patient, in the meantime, was advised to continue taking her aspirin 81 mg a day, and Brilinta 45 mg bid. she was also advised to continue with her antihypertensive medications. This will be scheduled approximately in the middle of September. Patient was advised to continue maintaining adequate hydration as well. A platelet inhibition test P2Y12 will be obtained prior to the planned treatment. Questions were answered to her satisfaction. Patient left with good understanding and agreement with the above management plan. Electronically Signed   By: Julieanne Cotton M.D.   On: 11/17/2020 10:35    Treatments: anticoagulation: Brilinta  Discharge Exam: Blood pressure (!) 145/84, pulse (!) 108, temperature 98.4 F (36.9 C), temperature source Oral, resp. rate (!) 22, height 5\' 7"  (1.702 m), weight 189 lb (85.7 kg), SpO2 94 %. Physical Exam Constitutional:      General: She is not in acute distress. HENT:     Head:     Comments: Left eye with visible swelling and scleral redness.     Mouth/Throat:     Mouth: Mucous membranes are moist.     Pharynx: Oropharynx is clear.  Eyes:     General: Visual field deficit present.     Extraocular Movements:     Right eye: No nystagmus.     Left eye: No nystagmus.  Cardiovascular:     Comments: Right groin  vascular site with slight oozing. Surrounding skin is soft and minimally tender; no evidence of hematoma. Quik clot pad placed over site and manual pressure held for 15 minutes by ICU RN.  Pulmonary:     Effort: Pulmonary effort is normal.  Skin:    General: Skin is warm and dry.  Neurological:     Mental Status: She is alert and oriented to  person, place, and time.     Cranial Nerves: No dysarthria or facial asymmetry.     Sensory: Sensation is intact.     Coordination: Coordination is intact.     Comments: Double/blurred vision in the left eye which is worse in the periphery. Patient able to see the clock on the wall and accurately state the time. Patient complaints of mild left eye tenderness. Bilateral pupils 3 mm and equal.     Disposition: Discharge disposition: 01-Home or Self Care   Allergies as of 12/17/2020   No Known Allergies      Medication List     TAKE these medications    amLODipine 10 MG tablet Commonly known as: NORVASC Take 1 tablet (10 mg total) by mouth daily.   Aspirin Low Dose 81 MG chewable tablet Generic drug: aspirin Chew 1 tablet (81 mg total) by mouth daily.   Brilinta 90 MG Tabs tablet Generic drug: ticagrelor Take 1 tablet (45 mg total) by mouth 2 (two) times daily for one week after discharge.  After one week, resume prior dose of 22.5 mg (1/4 tablet) 2 (two) times daily What changed:  how much to take when to take this   hydrochlorothiazide 25 MG tablet Commonly known as: HYDRODIURIL Take 1 tablet (25 mg total) by mouth 2 (two) times daily.   HYDROcodone-acetaminophen 5-325 MG tablet Commonly known as: NORCO/VICODIN Take 1 tablet by mouth every 6 (six) hours as needed for moderate pain or severe pain.   multivitamin with minerals Tabs tablet Take 1 tablet by mouth daily.        Follow-up Information     Julieanne Cotton, MD Follow up.   Specialties: Interventional Radiology, Radiology Why: Follow up with Dr. Corliss Marquez in 2 weeks. A scheduler from our office will call you with a date/time for your appointment. Please call our office with any questions/concerns prior to your visit. Contact information: [address] [place] [address] [phone]                  Electronically Signed: Mickie Kay, NP 12/17/2020, 12:37 PM   I have spent  Greater Than 30 Minutes discharging [facility].

## 2020-12-29 ENCOUNTER — Other Ambulatory Visit (HOSPITAL_COMMUNITY): Payer: Self-pay | Admitting: Physician Assistant

## 2020-12-30 ENCOUNTER — Other Ambulatory Visit: Payer: Self-pay

## 2020-12-30 ENCOUNTER — Ambulatory Visit (HOSPITAL_COMMUNITY)
Admission: RE | Admit: 2020-12-30 | Discharge: 2020-12-30 | Disposition: A | Discharge status: Home / Self Care | Payer: BC Managed Care – PPO | Source: Ambulatory Visit | Attending: Student | Admitting: Student

## 2020-12-30 DIAGNOSIS — I671 Cerebral aneurysm, nonruptured: Secondary | ICD-10-CM

## 2021-01-01 HISTORY — PX: IR RADIOLOGIST EVAL & MGMT: IMG5224

## 2021-01-12 ENCOUNTER — Other Ambulatory Visit (HOSPITAL_COMMUNITY): Payer: Self-pay | Admitting: Interventional Radiology

## 2021-01-12 ENCOUNTER — Telehealth (HOSPITAL_COMMUNITY): Payer: Self-pay

## 2021-01-12 DIAGNOSIS — I671 Cerebral aneurysm, nonruptured: Secondary | ICD-10-CM

## 2021-01-12 NOTE — Telephone Encounter (Signed)
Called to schedule ct scan, no answer, left vm. AW

## 2021-01-21 ENCOUNTER — Ambulatory Visit (HOSPITAL_COMMUNITY)
Admission: RE | Admit: 2021-01-21 | Discharge: 2021-01-21 | Disposition: A | Discharge status: Home / Self Care | Payer: BC Managed Care – PPO | Source: Ambulatory Visit | Attending: Interventional Radiology | Admitting: Interventional Radiology

## 2021-01-21 DIAGNOSIS — I671 Cerebral aneurysm, nonruptured: Secondary | ICD-10-CM | POA: Diagnosis not present

## 2021-01-26 ENCOUNTER — Telehealth (HOSPITAL_COMMUNITY): Payer: Self-pay

## 2021-01-26 NOTE — Telephone Encounter (Signed)
Pt agreed to f/u in 6 weeks with ct head wo. AW

## 2021-03-15 ENCOUNTER — Other Ambulatory Visit (HOSPITAL_COMMUNITY): Payer: Self-pay | Admitting: Interventional Radiology

## 2021-03-15 DIAGNOSIS — I671 Cerebral aneurysm, nonruptured: Secondary | ICD-10-CM

## 2021-04-01 ENCOUNTER — Ambulatory Visit (HOSPITAL_COMMUNITY): Payer: BC Managed Care – PPO

## 2021-04-02 ENCOUNTER — Other Ambulatory Visit: Payer: Self-pay

## 2021-04-02 ENCOUNTER — Ambulatory Visit (HOSPITAL_COMMUNITY)
Admission: RE | Admit: 2021-04-02 | Discharge: 2021-04-02 | Disposition: A | Discharge status: Home / Self Care | Payer: BC Managed Care – PPO | Source: Ambulatory Visit | Attending: Interventional Radiology | Admitting: Interventional Radiology

## 2021-04-02 DIAGNOSIS — I671 Cerebral aneurysm, nonruptured: Secondary | ICD-10-CM | POA: Diagnosis not present

## 2021-04-05 ENCOUNTER — Telehealth (HOSPITAL_COMMUNITY): Payer: Self-pay

## 2021-04-05 NOTE — Telephone Encounter (Signed)
Returned pt's call, she would like to set up a consult to discuss recent imaging and discuss future plans. AW

## 2021-04-08 ENCOUNTER — Other Ambulatory Visit (HOSPITAL_COMMUNITY): Payer: Self-pay | Admitting: Interventional Radiology

## 2021-04-08 ENCOUNTER — Telehealth (HOSPITAL_COMMUNITY): Payer: Self-pay

## 2021-04-08 DIAGNOSIS — I671 Cerebral aneurysm, nonruptured: Secondary | ICD-10-CM

## 2021-04-08 NOTE — Telephone Encounter (Signed)
-----   Message from Willette Brace, PA-C sent at 04/08/2021 11:36 AM EST ----- Hi guys,   Dr. Erlene Senters would like to see this patient in person to discuss about recent CT scan and if she still needs to take her Brilinta and Aspirin.  The CT scan looked stable so she should not be worried about, he just wants to discuss what will be the next step to follow up with her brain aneurysm.   Thank you,  Aimee

## 2021-04-13 ENCOUNTER — Other Ambulatory Visit: Payer: Self-pay

## 2021-04-13 ENCOUNTER — Ambulatory Visit (HOSPITAL_COMMUNITY)
Admission: RE | Admit: 2021-04-13 | Discharge: 2021-04-13 | Disposition: A | Discharge status: Home / Self Care | Payer: BC Managed Care – PPO | Source: Ambulatory Visit | Attending: Interventional Radiology | Admitting: Interventional Radiology

## 2021-04-13 DIAGNOSIS — I671 Cerebral aneurysm, nonruptured: Secondary | ICD-10-CM

## 2021-04-15 HISTORY — PX: IR RADIOLOGIST EVAL & MGMT: IMG5224

## 2021-05-18 ENCOUNTER — Other Ambulatory Visit (HOSPITAL_COMMUNITY): Payer: Self-pay | Admitting: Interventional Radiology

## 2021-05-18 ENCOUNTER — Telehealth (HOSPITAL_COMMUNITY): Payer: Self-pay

## 2021-05-18 DIAGNOSIS — I671 Cerebral aneurysm, nonruptured: Secondary | ICD-10-CM

## 2021-05-18 NOTE — Telephone Encounter (Signed)
Called to schedule mri, no answer, left vm. AW 

## 2021-06-07 ENCOUNTER — Ambulatory Visit (HOSPITAL_COMMUNITY)
Admission: RE | Admit: 2021-06-07 | Discharge: 2021-06-07 | Disposition: A | Discharge status: Home / Self Care | Payer: BC Managed Care – PPO | Source: Ambulatory Visit | Attending: Interventional Radiology | Admitting: Interventional Radiology

## 2021-06-07 ENCOUNTER — Other Ambulatory Visit: Payer: Self-pay

## 2021-06-07 DIAGNOSIS — I671 Cerebral aneurysm, nonruptured: Secondary | ICD-10-CM | POA: Diagnosis present

## 2021-06-09 ENCOUNTER — Telehealth (HOSPITAL_COMMUNITY): Payer: Self-pay

## 2021-06-09 NOTE — Telephone Encounter (Signed)
Pt agreed to f/u in 6 months with an mri/mra. AW  

## 2021-06-22 ENCOUNTER — Telehealth (HOSPITAL_COMMUNITY): Payer: Self-pay

## 2021-06-22 NOTE — Telephone Encounter (Signed)
Returned pt's call regarding referral, no answer, left vm. AW  ?

## 2021-06-24 ENCOUNTER — Telehealth (HOSPITAL_COMMUNITY): Payer: Self-pay

## 2021-06-24 NOTE — Telephone Encounter (Signed)
Pt called to get a referral sent to Dr. Katy Fitch. Referral sent along with office notes. AW  ?

## 2021-07-13 ENCOUNTER — Telehealth (HOSPITAL_COMMUNITY): Payer: Self-pay

## 2021-07-13 NOTE — Telephone Encounter (Signed)
Received a call from Dr. Patrici Ranks office requesting office notes. I have faxed notes and imaging reports to [phone] per their request. AW ?

## 2021-08-04 ENCOUNTER — Encounter: Payer: Self-pay | Admitting: Internal Medicine

## 2021-08-27 ENCOUNTER — Ambulatory Visit (INDEPENDENT_AMBULATORY_CARE_PROVIDER_SITE_OTHER): Payer: BC Managed Care – PPO | Admitting: Internal Medicine

## 2021-08-27 ENCOUNTER — Encounter: Payer: Self-pay | Admitting: Internal Medicine

## 2021-08-27 VITALS — BP 90/70 | HR 88 | Ht 67.0 in | Wt 205.1 lb

## 2021-08-27 DIAGNOSIS — Z1211 Encounter for screening for malignant neoplasm of colon: Secondary | ICD-10-CM

## 2021-08-27 NOTE — Progress Notes (Signed)
Chief Complaint: Colon cancer screening  HPI : 61 year old female with history of HFpEF and left ICA cavernous aneurysm s/p embolization on Brilinta presents for discussion of colon cancer screening  She is asymptomatic at this time.  Denies blood in stools, diarrhea, constipation, weight loss.  Denies N&V, dysphagia, abdominal pain.  Denies family history of colon cancer.  She has never had a colonoscopy in the past.  She is currently on Brilinta.  She has had a cholecystectomy in the past.  She works as a IT trainer.   Past Medical History:  Diagnosis Date   Brain aneurysm 08/2020   Gallstones    Hypertension    Past Surgical History:  Procedure Laterality Date   CHOLECYSTECTOMY     IR 3D INDEPENDENT WKST  09/18/2020   IR ANGIO INTRA EXTRACRAN SEL COM CAROTID INNOMINATE BILAT MOD SED  09/18/2020   IR ANGIO INTRA EXTRACRAN SEL COM CAROTID INNOMINATE BILAT MOD SED  09/17/2020   IR ANGIO VERTEBRAL SEL VERTEBRAL UNI R MOD SED  09/18/2020   IR ANGIO VERTEBRAL SEL VERTEBRAL UNI R MOD SED  09/17/2020   IR CT HEAD LTD  09/18/2020   IR CT HEAD LTD  12/16/2020   IR RADIOLOGIST EVAL & MGMT  10/12/2020   IR RADIOLOGIST EVAL & MGMT  11/18/2020   IR RADIOLOGIST EVAL & MGMT  01/01/2021   IR RADIOLOGIST EVAL & MGMT  04/15/2021   IR TRANSCATH/EMBOLIZ  09/18/2020   IR TRANSCATH/EMBOLIZ  12/16/2020   IR US GUIDE VASC ACCESS RIGHT  09/18/2020   IR US GUIDE VASC ACCESS RIGHT  09/17/2020   RADIOLOGY WITH ANESTHESIA N/A 09/18/2020   Procedure: IR WITH ANESTHESIA ANEURYSM EMBOLIZATION;  Surgeon: Julieanne Cotton, MD;  Location: [facility] OR;  Service: Radiology;  Laterality: N/A;   RADIOLOGY WITH ANESTHESIA N/A 12/16/2020   Procedure: RADIOLOGY WITH ANESTHESIA EMBOLIZATION;  Surgeon: Julieanne Cotton, MD;  Location: [facility] OR;  Service: Radiology;  Laterality: N/A;   Family History  Problem Relation Age of Onset   Arthritis Mother    Alzheimer's disease Father    Hypertension Father    Gout Father     Hyperlipidemia Brother    Polycystic ovary syndrome Daughter    Social History   Tobacco Use   Smoking status: Never   Smokeless tobacco: Never  Vaping Use   Vaping Use: Never used  Substance Use Topics   Alcohol use: Never   Drug use: Never   Current Outpatient Medications  Medication Sig Dispense Refill   aspirin 81 MG chewable tablet Chew 1 tablet (81 mg total) by mouth daily. 30 tablet 3   dorzolamide-timolol (COSOPT) 22.3-6.8 MG/ML ophthalmic solution Place 1 drop into the left eye 2 (two) times daily.     hydrochlorothiazide (HYDRODIURIL) 25 MG tablet Take 1 tablet (25 mg total) by mouth 2 (two) times daily. 30 tablet 0   Multiple Vitamin (MULTIVITAMIN WITH MINERALS) TABS tablet Take 1 tablet by mouth daily.     olmesartan (BENICAR) 40 MG tablet Take 40 mg by mouth daily.     ticagrelor (BRILINTA) 90 MG TABS tablet Take 1 tablet (90 mg total) by mouth 2 (two) times daily. (Patient taking differently: Take 22.5 mg by mouth daily.) 60 tablet 3   No current facility-administered medications for this visit.   No Known Allergies   Review of Systems: All systems reviewed and negative except where noted in HPI.   Physical Exam: BP 90/70 (BP Location: Left Arm, Patient Position: Sitting, Cuff Size:  Normal)   Pulse 88   Ht 5\' 7"  (1.702 m)   Wt 205 lb 2 oz (93 kg)   BMI 32.13 kg/m  Constitutional: Pleasant,well-developed, female in no acute distress. HEENT: Normocephalic and atraumatic. Conjunctivae are normal. No scleral icterus. Cardiovascular: Normal rate, regular rhythm.  Pulmonary/chest: Effort normal and breath sounds normal. No wheezing, rales or rhonchi. Abdominal: Soft, nondistended, nontender. Bowel sounds active throughout. There are no masses palpable. No hepatomegaly. Extremities: No edema Neurological: Alert and oriented to person place and time. Skin: Skin is warm and dry. No rashes noted. Psychiatric: Normal mood and affect. Behavior is normal.  Labs  11/2020: CBC with low Hb of 11.3. BMP with mildly low potassium of 3.1.   TTE 09/21/20: IMPRESSIONS   1. Left ventricular ejection fraction, by estimation, is 65 to 70%. The left ventricle has normal function. The left ventricle has no regional wall motion abnormalities. There is moderate concentric left ventricular hypertrophy. Left ventricular  diastolic parameters are consistent with Grade I diastolic dysfunction (impaired relaxation).   2. Right ventricular systolic function is normal. The right ventricular size is normal.   3. Left atrial size was mildly dilated.   4. The mitral valve is normal in structure. No evidence of mitral valve regurgitation.   5. The aortic valve is tricuspid. Aortic valve regurgitation is not  visualized.   6. The inferior vena cava is normal in size with greater than 50% respiratory variability, suggesting right atrial pressure of 3 mmHg  ASSESSMENT AND PLAN: Colon cancer screening Patient presents to discuss colon cancer screening options.  I went over stool test versus colonoscopy for colon cancer screening.  Patient is interested in the Cologuard test.  Patient does have a mild anemia on her last CBC, but at that time she was being treated for brain aneurysm repair and was noted to have some oozing from the right femoral vascular site so I suspect that her anemia was related to the surgery rather than a GI tract source. - Cologuard test  09/23/20, MD

## 2021-08-27 NOTE — Patient Instructions (Signed)
If you are age 63 or older, your body mass index should be between 23-30. Your Body mass index is 32.13 kg/m. If this is out of the aforementioned range listed, please consider follow up with your Primary Care Provider.  If you are age 11 or younger, your body mass index should be between 19-25. Your Body mass index is 32.13 kg/m. If this is out of the aformentioned range listed, please consider follow up with your Primary Care Provider.   Your provider has ordered Cologuard testing as an option for colon cancer screening. This is performed by [employer] and may be out of network with your insurance. PRIOR to completing the test, it is YOUR responsibility to contact your insurance about covered benefits for this test. Your out of pocket expense could be anywhere from $0.00 to $649.00.   When you call to check coverage with your insurer, please provide the following information:   -The ONLY provider of Cologuard is [place] code for Cologuard is [phone].  [employer] NPI # [id number]  -[employer] Tax ID # [id number]   We have already sent your demographic and insurance information to [employer] (phone number [phone]) and they should contact you within the next week regarding your test. If you have not heard from them within the next week, please call our office at [phone].    ________________________________________________________  The [facility] providers would like to encourage you to use [facility] to communicate with providers for non-urgent requests or questions.  Due to long hold times on the telephone, sending your provider a message by [facility] may be a faster and more efficient way to get a response.  Please allow 48 business hours for a response.  Please remember that this is for non-urgent requests.   Thank you for entrusting me with your care and for choosing [facility], Dr. Christia Reading

## 2021-12-08 ENCOUNTER — Other Ambulatory Visit (HOSPITAL_COMMUNITY): Payer: Self-pay | Admitting: Interventional Radiology

## 2021-12-08 DIAGNOSIS — I671 Cerebral aneurysm, nonruptured: Secondary | ICD-10-CM

## 2021-12-28 ENCOUNTER — Ambulatory Visit (HOSPITAL_COMMUNITY)
Admission: RE | Admit: 2021-12-28 | Discharge: 2021-12-28 | Disposition: A | Discharge status: Home / Self Care | Payer: BC Managed Care – PPO | Source: Ambulatory Visit | Attending: Interventional Radiology | Admitting: Interventional Radiology

## 2021-12-28 DIAGNOSIS — I671 Cerebral aneurysm, nonruptured: Secondary | ICD-10-CM | POA: Insufficient documentation

## 2022-01-05 ENCOUNTER — Other Ambulatory Visit (HOSPITAL_COMMUNITY): Payer: Self-pay | Admitting: Interventional Radiology

## 2022-01-05 DIAGNOSIS — I671 Cerebral aneurysm, nonruptured: Secondary | ICD-10-CM

## 2022-01-13 ENCOUNTER — Ambulatory Visit (HOSPITAL_COMMUNITY): Admission: RE | Admit: 2022-01-13 | Payer: BC Managed Care – PPO | Source: Ambulatory Visit

## 2022-01-19 ENCOUNTER — Ambulatory Visit (HOSPITAL_COMMUNITY)
Admission: RE | Admit: 2022-01-19 | Discharge: 2022-01-19 | Disposition: A | Discharge status: Home / Self Care | Payer: BC Managed Care – PPO | Source: Ambulatory Visit | Attending: Interventional Radiology | Admitting: Interventional Radiology

## 2022-01-19 DIAGNOSIS — I671 Cerebral aneurysm, nonruptured: Secondary | ICD-10-CM

## 2022-01-21 ENCOUNTER — Ambulatory Visit (HOSPITAL_COMMUNITY)
Admission: RE | Admit: 2022-01-21 | Discharge: 2022-01-21 | Disposition: A | Discharge status: Home / Self Care | Payer: BC Managed Care – PPO | Source: Ambulatory Visit | Attending: Interventional Radiology | Admitting: Interventional Radiology

## 2022-01-31 ENCOUNTER — Other Ambulatory Visit (HOSPITAL_COMMUNITY): Payer: Self-pay | Admitting: Interventional Radiology

## 2022-01-31 DIAGNOSIS — I671 Cerebral aneurysm, nonruptured: Secondary | ICD-10-CM

## 2022-02-15 ENCOUNTER — Ambulatory Visit (HOSPITAL_COMMUNITY)
Admission: RE | Admit: 2022-02-15 | Discharge: 2022-02-15 | Disposition: A | Discharge status: Home / Self Care | Payer: BC Managed Care – PPO | Source: Ambulatory Visit | Attending: Interventional Radiology | Admitting: Interventional Radiology

## 2022-02-15 DIAGNOSIS — I671 Cerebral aneurysm, nonruptured: Secondary | ICD-10-CM | POA: Insufficient documentation

## 2022-02-15 MED ORDER — GADOBUTROL 1 MMOL/ML IV SOLN
9.0000 mL | Freq: Once | INTRAVENOUS | Status: AC | PRN
  Administered 2022-02-15: 9 mL via INTRAVENOUS

## 2022-02-23 ENCOUNTER — Other Ambulatory Visit (HOSPITAL_COMMUNITY): Payer: Self-pay | Admitting: Interventional Radiology

## 2022-02-23 DIAGNOSIS — I671 Cerebral aneurysm, nonruptured: Secondary | ICD-10-CM

## 2022-03-04 ENCOUNTER — Ambulatory Visit (HOSPITAL_COMMUNITY)
Admission: RE | Admit: 2022-03-04 | Discharge: 2022-03-04 | Disposition: A | Discharge status: Home / Self Care | Payer: BC Managed Care – PPO | Source: Ambulatory Visit | Attending: Interventional Radiology | Admitting: Interventional Radiology

## 2022-03-04 DIAGNOSIS — I671 Cerebral aneurysm, nonruptured: Secondary | ICD-10-CM

## 2022-03-07 HISTORY — PX: IR RADIOLOGIST EVAL & MGMT: IMG5224

## 2022-03-17 ENCOUNTER — Other Ambulatory Visit (HOSPITAL_COMMUNITY): Payer: Self-pay | Admitting: Interventional Radiology

## 2022-03-17 ENCOUNTER — Encounter (HOSPITAL_COMMUNITY): Payer: Self-pay

## 2022-03-17 DIAGNOSIS — I671 Cerebral aneurysm, nonruptured: Secondary | ICD-10-CM

## 2022-04-05 ENCOUNTER — Encounter (HOSPITAL_COMMUNITY): Payer: Self-pay | Admitting: Interventional Radiology

## 2022-04-05 ENCOUNTER — Other Ambulatory Visit: Payer: Self-pay | Admitting: Radiology

## 2022-04-05 DIAGNOSIS — I671 Cerebral aneurysm, nonruptured: Secondary | ICD-10-CM

## 2022-04-05 NOTE — Progress Notes (Signed)
PCP - Selina Cooley, NP Cardiologist - n/a  Chest x-ray - n/a EKG - 09/20/20 Stress Test - n/a ECHO - 09/21/20 Cardiac Cath - n/a  ICD Pacemaker/Loop - n/a  Sleep Study -  n/a CPAP - none  STOP now taking any Aspirin (unless otherwise instructed by your surgeon), Aleve, Naproxen, Ibuprofen, Motrin, Advil, Goody's, BC's, all herbal medications, fish oil, and all vitamins.   Coronavirus Screening Do you have any of the following symptoms:  Cough yes/no: No Fever (>100.67F)  yes/no: No Runny nose yes/no: No Sore throat yes/no: No Difficulty breathing/shortness of breath  yes/no: No  Have you traveled in the last 14 days and where? yes/no: No  Patient verbalized understanding of instructions that were given via phone

## 2022-04-06 ENCOUNTER — Encounter (HOSPITAL_COMMUNITY)
Admission: RE | Disposition: A | Discharge status: Home / Self Care | Payer: Self-pay | Source: Home / Self Care | Attending: Interventional Radiology

## 2022-04-06 ENCOUNTER — Other Ambulatory Visit: Payer: Self-pay

## 2022-04-06 ENCOUNTER — Inpatient Hospital Stay (HOSPITAL_COMMUNITY)
Admission: RE | Admit: 2022-04-06 | Discharge: 2022-04-06 | Disposition: A | Discharge status: Home / Self Care | Payer: BC Managed Care – PPO | Source: Ambulatory Visit | Attending: Interventional Radiology | Admitting: Interventional Radiology

## 2022-04-06 ENCOUNTER — Ambulatory Visit (HOSPITAL_COMMUNITY): Payer: BC Managed Care – PPO | Admitting: Certified Registered"

## 2022-04-06 ENCOUNTER — Inpatient Hospital Stay (HOSPITAL_COMMUNITY)
Admission: RE | Admit: 2022-04-06 | Discharge: 2022-04-07 | DRG: 027 | Disposition: A | Discharge status: Home / Self Care | Payer: BC Managed Care – PPO | Attending: Interventional Radiology | Admitting: Interventional Radiology

## 2022-04-06 ENCOUNTER — Encounter (HOSPITAL_COMMUNITY): Payer: Self-pay | Admitting: Interventional Radiology

## 2022-04-06 DIAGNOSIS — Z9049 Acquired absence of other specified parts of digestive tract: Secondary | ICD-10-CM

## 2022-04-06 DIAGNOSIS — Z79899 Other long term (current) drug therapy: Secondary | ICD-10-CM

## 2022-04-06 DIAGNOSIS — I671 Cerebral aneurysm, nonruptured: Principal | ICD-10-CM | POA: Diagnosis present

## 2022-04-06 DIAGNOSIS — Z7982 Long term (current) use of aspirin: Secondary | ICD-10-CM

## 2022-04-06 DIAGNOSIS — I10 Essential (primary) hypertension: Secondary | ICD-10-CM | POA: Diagnosis present

## 2022-04-06 DIAGNOSIS — Z83438 Family history of other disorder of lipoprotein metabolism and other lipidemia: Secondary | ICD-10-CM | POA: Diagnosis not present

## 2022-04-06 DIAGNOSIS — H532 Diplopia: Secondary | ICD-10-CM | POA: Diagnosis present

## 2022-04-06 DIAGNOSIS — Z8249 Family history of ischemic heart disease and other diseases of the circulatory system: Secondary | ICD-10-CM

## 2022-04-06 DIAGNOSIS — E785 Hyperlipidemia, unspecified: Secondary | ICD-10-CM | POA: Diagnosis present

## 2022-04-06 HISTORY — PX: IR TRANSCATH/EMBOLIZ: IMG695

## 2022-04-06 HISTORY — DX: Hyperlipidemia, unspecified: E78.5

## 2022-04-06 HISTORY — PX: RADIOLOGY WITH ANESTHESIA: SHX6223

## 2022-04-06 HISTORY — PX: IR NEURO EACH ADD'L AFTER BASIC UNI LEFT (MS): IMG5373

## 2022-04-06 HISTORY — PX: IR ANGIOGRAM FOLLOW UP STUDY: IMG697

## 2022-04-06 HISTORY — DX: Unspecified glaucoma: H40.9

## 2022-04-06 HISTORY — PX: IR ANGIO INTRA EXTRACRAN SEL INTERNAL CAROTID BILAT MOD SED: IMG5363

## 2022-04-06 HISTORY — PX: IR CT HEAD LTD: IMG2386

## 2022-04-06 LAB — CBC WITH DIFFERENTIAL/PLATELET
Abs Immature Granulocytes: 0.02 10*3/uL (ref 0.00–0.07)
Basophils Absolute: 0.1 10*3/uL (ref 0.0–0.1)
Basophils Relative: 1 %
Eosinophils Absolute: 0.2 10*3/uL (ref 0.0–0.5)
Eosinophils Relative: 3 %
HCT: 39.1 % (ref 36.0–46.0)
Hemoglobin: 12.6 g/dL (ref 12.0–15.0)
Immature Granulocytes: 0 %
Lymphocytes Relative: 25 %
Lymphs Abs: 1.7 10*3/uL (ref 0.7–4.0)
MCH: 27.2 pg (ref 26.0–34.0)
MCHC: 32.2 g/dL (ref 30.0–36.0)
MCV: 84.4 fL (ref 80.0–100.0)
Monocytes Absolute: 0.4 10*3/uL (ref 0.1–1.0)
Monocytes Relative: 6 %
Neutro Abs: 4.5 10*3/uL (ref 1.7–7.7)
Neutrophils Relative %: 65 %
Platelets: 224 10*3/uL (ref 150–400)
RBC: 4.63 MIL/uL (ref 3.87–5.11)
RDW: 15.1 % (ref 11.5–15.5)
WBC: 6.9 10*3/uL (ref 4.0–10.5)
nRBC: 0 % (ref 0.0–0.2)

## 2022-04-06 LAB — BASIC METABOLIC PANEL
Anion gap: 8 (ref 5–15)
BUN: 25 mg/dL — ABNORMAL HIGH (ref 8–23)
CO2: 25 mmol/L (ref 22–32)
Calcium: 9.1 mg/dL (ref 8.9–10.3)
Chloride: 103 mmol/L (ref 98–111)
Creatinine, Ser: 1.08 mg/dL — ABNORMAL HIGH (ref 0.44–1.00)
GFR, Estimated: 58 mL/min — ABNORMAL LOW (ref >60)
Glucose, Bld: 103 mg/dL — ABNORMAL HIGH (ref 70–99)
Potassium: 3.3 mmol/L — ABNORMAL LOW (ref 3.5–5.1)
Sodium: 136 mmol/L (ref 135–145)

## 2022-04-06 LAB — MRSA NEXT GEN BY PCR, NASAL: MRSA by PCR Next Gen: NOT DETECTED

## 2022-04-06 LAB — PROTIME-INR
INR: 1 (ref 0.8–1.2)
Prothrombin Time: 12.6 seconds (ref 11.4–15.2)

## 2022-04-06 LAB — POCT ACTIVATED CLOTTING TIME
Activated Clotting Time: 196 seconds
Activated Clotting Time: 228 seconds

## 2022-04-06 LAB — POCT I-STAT CREATININE: Creatinine, Ser: 1 mg/dL (ref 0.44–1.00)

## 2022-04-06 SURGERY — IR WITH ANESTHESIA
Anesthesia: General

## 2022-04-06 MED ORDER — SODIUM CHLORIDE 0.9 % IV SOLN: INTRAVENOUS | Status: DC

## 2022-04-06 MED ORDER — LACTATED RINGERS IV SOLN: INTRAVENOUS | Status: DC | PRN

## 2022-04-06 MED ORDER — NIMODIPINE 30 MG PO CAPS: 0.0000 mg | ORAL_CAPSULE | ORAL | Status: DC

## 2022-04-06 MED ORDER — ASPIRIN 81 MG PO CHEW
81.0000 mg | CHEWABLE_TABLET | Freq: Every day | ORAL | Status: DC
  Administered 2022-04-07: 81 mg via ORAL
  Filled 2022-04-06: qty 1

## 2022-04-06 MED ORDER — ORAL CARE MOUTH RINSE: 15.0000 mL | Freq: Once | OROMUCOSAL | Status: AC

## 2022-04-06 MED ORDER — CLEVIDIPINE BUTYRATE 0.5 MG/ML IV EMUL
0.0000 mg/h | INTRAVENOUS | Status: DC
  Administered 2022-04-06: 2 mg/h via INTRAVENOUS
  Administered 2022-04-06: 10 mg/h via INTRAVENOUS
  Filled 2022-04-06: qty 100

## 2022-04-06 MED ORDER — HEPARIN SODIUM (PORCINE) 1000 UNIT/ML IJ SOLN
INTRAMUSCULAR | Status: DC | PRN
  Administered 2022-04-06: 3000 [IU] via INTRAVENOUS
  Administered 2022-04-06: 1000 [IU] via INTRAVENOUS

## 2022-04-06 MED ORDER — PROPOFOL 10 MG/ML IV BOLUS
INTRAVENOUS | Status: DC | PRN
  Administered 2022-04-06: 20 mg via INTRAVENOUS
  Administered 2022-04-06: 140 mg via INTRAVENOUS
  Administered 2022-04-06: 40 mg via INTRAVENOUS

## 2022-04-06 MED ORDER — ASPIRIN 81 MG PO TBEC
81.0000 mg | DELAYED_RELEASE_TABLET | Freq: Every day | ORAL | Status: DC
  Administered 2022-04-06: 81 mg via ORAL

## 2022-04-06 MED ORDER — TICAGRELOR 90 MG PO TABS
45.0000 mg | ORAL_TABLET | Freq: Two times a day (BID) | ORAL | Status: DC
  Administered 2022-04-06 – 2022-04-07 (×2): 45 mg via ORAL
  Filled 2022-04-06 (×2): qty 1

## 2022-04-06 MED ORDER — ACETAMINOPHEN 10 MG/ML IV SOLN: 1000.0000 mg | Freq: Once | INTRAVENOUS | Status: DC | PRN

## 2022-04-06 MED ORDER — FENTANYL CITRATE (PF) 100 MCG/2ML IJ SOLN
INTRAMUSCULAR | Status: AC
  Filled 2022-04-06: qty 2

## 2022-04-06 MED ORDER — TICAGRELOR 90 MG PO TABS
90.0000 mg | ORAL_TABLET | ORAL | Status: AC
  Administered 2022-04-06: 90 mg via ORAL
  Filled 2022-04-06: qty 1

## 2022-04-06 MED ORDER — ACETAMINOPHEN 500 MG PO TABS: 1000.0000 mg | ORAL_TABLET | Freq: Once | ORAL | Status: DC | PRN

## 2022-04-06 MED ORDER — CHLORHEXIDINE GLUCONATE CLOTH 2 % EX PADS
6.0000 | MEDICATED_PAD | Freq: Every day | CUTANEOUS | Status: DC
  Administered 2022-04-06 – 2022-04-07 (×2): 6 via TOPICAL

## 2022-04-06 MED ORDER — PHENYLEPHRINE 80 MCG/ML (10ML) SYRINGE FOR IV PUSH (FOR BLOOD PRESSURE SUPPORT)
PREFILLED_SYRINGE | INTRAVENOUS | Status: DC | PRN
  Administered 2022-04-06 (×5): 80 ug via INTRAVENOUS

## 2022-04-06 MED ORDER — DEXAMETHASONE SODIUM PHOSPHATE 10 MG/ML IJ SOLN
INTRAMUSCULAR | Status: DC | PRN
  Administered 2022-04-06: 10 mg via INTRAVENOUS

## 2022-04-06 MED ORDER — TICAGRELOR 90 MG PO TABS: 90.0000 mg | ORAL_TABLET | Freq: Two times a day (BID) | ORAL | Status: DC

## 2022-04-06 MED ORDER — ACETAMINOPHEN 160 MG/5ML PO SOLN: 650.0000 mg | ORAL | Status: DC | PRN

## 2022-04-06 MED ORDER — CEFAZOLIN SODIUM-DEXTROSE 2-4 GM/100ML-% IV SOLN
2.0000 g | INTRAVENOUS | Status: AC
  Administered 2022-04-06: 2 g via INTRAVENOUS
  Filled 2022-04-06: qty 100

## 2022-04-06 MED ORDER — IOHEXOL 300 MG/ML  SOLN: 150.0000 mL | Freq: Once | INTRAMUSCULAR | Status: DC | PRN

## 2022-04-06 MED ORDER — HEPARIN (PORCINE) 25000 UT/250ML-% IV SOLN
800.0000 [IU]/h | INTRAVENOUS | Status: DC
  Administered 2022-04-06: 800 [IU]/h via INTRAVENOUS
  Filled 2022-04-06: qty 250

## 2022-04-06 MED ORDER — ACETAMINOPHEN 325 MG PO TABS
650.0000 mg | ORAL_TABLET | ORAL | Status: DC | PRN
  Administered 2022-04-06 – 2022-04-07 (×2): 650 mg via ORAL
  Filled 2022-04-06 (×2): qty 2

## 2022-04-06 MED ORDER — PHENYLEPHRINE HCL-NACL 20-0.9 MG/250ML-% IV SOLN
INTRAVENOUS | Status: DC | PRN
  Administered 2022-04-06: 25 ug/min via INTRAVENOUS

## 2022-04-06 MED ORDER — HEPARIN (PORCINE) 25000 UT/250ML-% IV SOLN
INTRAVENOUS | Status: AC
  Filled 2022-04-06: qty 250

## 2022-04-06 MED ORDER — ORAL CARE MOUTH RINSE: 15.0000 mL | OROMUCOSAL | Status: DC | PRN

## 2022-04-06 MED ORDER — CHLORHEXIDINE GLUCONATE 0.12 % MT SOLN
OROMUCOSAL | Status: AC
  Administered 2022-04-06: 15 mL via OROMUCOSAL
  Filled 2022-04-06: qty 15

## 2022-04-06 MED ORDER — ASPIRIN 81 MG PO TBEC
DELAYED_RELEASE_TABLET | ORAL | Status: AC
  Filled 2022-04-06: qty 1

## 2022-04-06 MED ORDER — ROCURONIUM BROMIDE 10 MG/ML (PF) SYRINGE
PREFILLED_SYRINGE | INTRAVENOUS | Status: DC | PRN
  Administered 2022-04-06 (×2): 10 mg via INTRAVENOUS
  Administered 2022-04-06: 20 mg via INTRAVENOUS
  Administered 2022-04-06: 90 mg via INTRAVENOUS

## 2022-04-06 MED ORDER — CHLORHEXIDINE GLUCONATE 0.12 % MT SOLN: 15.0000 mL | Freq: Once | OROMUCOSAL | Status: AC

## 2022-04-06 MED ORDER — HEPARIN (PORCINE) 25000 UT/250ML-% IV SOLN
800.0000 [IU]/h | INTRAVENOUS | Status: DC
  Filled 2022-04-06: qty 250

## 2022-04-06 MED ORDER — FENTANYL CITRATE (PF) 100 MCG/2ML IJ SOLN
25.0000 ug | INTRAMUSCULAR | Status: DC | PRN
  Administered 2022-04-06 (×4): 25 ug via INTRAVENOUS

## 2022-04-06 MED ORDER — ONDANSETRON HCL 4 MG/2ML IJ SOLN
INTRAMUSCULAR | Status: DC | PRN
  Administered 2022-04-06: 4 mg via INTRAVENOUS

## 2022-04-06 MED ORDER — CLEVIDIPINE BUTYRATE 0.5 MG/ML IV EMUL
INTRAVENOUS | Status: AC
  Filled 2022-04-06: qty 50

## 2022-04-06 MED ORDER — FENTANYL CITRATE (PF) 250 MCG/5ML IJ SOLN
INTRAMUSCULAR | Status: DC | PRN
  Administered 2022-04-06: 100 ug via INTRAVENOUS

## 2022-04-06 MED ORDER — NITROGLYCERIN 1 MG/10 ML FOR IR/CATH LAB
INTRA_ARTERIAL | Status: AC
  Filled 2022-04-06: qty 10

## 2022-04-06 MED ORDER — SUGAMMADEX SODIUM 200 MG/2ML IV SOLN
INTRAVENOUS | Status: DC | PRN
  Administered 2022-04-06: 200 mg via INTRAVENOUS

## 2022-04-06 MED ORDER — HEPARIN (PORCINE) 25000 UT/250ML-% IV SOLN
500.0000 [IU]/h | INTRAVENOUS | Status: DC
  Administered 2022-04-06: 500 [IU]/h via INTRAVENOUS

## 2022-04-06 MED ORDER — ACETAMINOPHEN 160 MG/5ML PO SOLN: 1000.0000 mg | Freq: Once | ORAL | Status: DC | PRN

## 2022-04-06 MED ORDER — CLEVIDIPINE BUTYRATE 0.5 MG/ML IV EMUL
INTRAVENOUS | Status: DC | PRN
  Administered 2022-04-06: 1 mg/h via INTRAVENOUS

## 2022-04-06 MED ORDER — LIDOCAINE 2% (20 MG/ML) 5 ML SYRINGE
INTRAMUSCULAR | Status: DC | PRN
  Administered 2022-04-06: 80 mg via INTRAVENOUS

## 2022-04-06 MED ORDER — ASPIRIN 81 MG PO CHEW: 81.0000 mg | CHEWABLE_TABLET | Freq: Every day | ORAL | Status: DC

## 2022-04-06 MED ORDER — ACETAMINOPHEN 650 MG RE SUPP: 650.0000 mg | RECTAL | Status: DC | PRN

## 2022-04-06 NOTE — Procedures (Signed)
INR. Status post bilateral common carotid arteriograms. Right CFA approach. Findings. 1.  Mild residual left ICA CC fistula with slow flow into the left superior ophthalmic vein.  Status post endovascular embolization of residual left ICA/CC fistula with placement of a single 4.5 mm x 16 mm pipeline shield flow diverter device with further stasis. Post CT brain demonstrates no hemorrhagic complications.  8 French Angio-Seal closure device deployed for hemostasis of the right groin puncture site.  Distal pulses all dopplerable. Patient extubated.  Denies any headaches nausea vomiting.  Pupils 3 to 4 mm bilaterally sluggish.  No facial asymmetry.  Tongue midline.  Moves all fours equally.  Arlean Hopping MD

## 2022-04-06 NOTE — Sedation Documentation (Signed)
[id number]

## 2022-04-06 NOTE — Progress Notes (Signed)
Orthopedic Tech Progress Note Patient Details:  Rose Marquez October 26, 1960 [id number]  Ortho Devices Type of Ortho Device: Knee Immobilizer Ortho Device/Splint Location: Right knee Ortho Device/Splint Interventions: Application   Post Interventions Patient Tolerated: Well  Linus Salmons  04/06/2022, 1:44 PM

## 2022-04-06 NOTE — Anesthesia Procedure Notes (Signed)
Procedure Name: Intubation Date/Time: 04/06/2022 9:22 AM  Performed by: Dorthea Cove, CRNAPre-anesthesia Checklist: Patient identified, Emergency Drugs available, Suction available and Patient being monitored Patient Re-evaluated:Patient Re-evaluated prior to induction Oxygen Delivery Method: Circle system utilized Preoxygenation: Pre-oxygenation with 100% oxygen Induction Type: IV induction Ventilation: Mask ventilation without difficulty and Two handed mask ventilation required Laryngoscope Size: Mac and 3 Grade View: Grade II Tube type: Oral Tube size: 7.0 mm Number of attempts: 2 Airway Equipment and Method: Stylet and Oral airway Placement Confirmation: ETT inserted through vocal cords under direct vision, positive ETCO2 and breath sounds checked- equal and bilateral Secured at: 23 cm Tube secured with: Tape Dental Injury: Teeth and Oropharynx as per pre-operative assessment  Comments: CRNA attempted with grade II view unable to pass tube. MD attempted x1 with grade II view able to pass tube. Due to IR bed, unable to position the patient to optimize airway.

## 2022-04-06 NOTE — Anesthesia Procedure Notes (Signed)
Arterial Line Insertion Start/End1/12/2022 8:25 AM, 04/06/2022 8:35 AM Performed by: Dorthea Cove, CRNA, CRNA  Patient location: Pre-op. Preanesthetic checklist: patient identified, IV checked, site marked, risks and benefits discussed, surgical consent, monitors and equipment checked, pre-op evaluation, timeout performed and anesthesia consent Lidocaine 1% used for infiltration Left, radial was placed Catheter size: 20 G Hand hygiene performed  and maximum sterile barriers used   Attempts: 1 Procedure performed without using ultrasound guided technique. Following insertion, dressing applied and Biopatch. Post procedure assessment: normal and unchanged  Patient tolerated the procedure well with no immediate complications.

## 2022-04-06 NOTE — H&P (Signed)
  The note originally documented on this encounter has been moved the the encounter in which it belongs.  

## 2022-04-06 NOTE — Transfer of Care (Signed)
Immediate Anesthesia Transfer of Care Note  Patient: Rose Marquez  Procedure(s) Performed: Aneurysm embolization  Patient Location: PACU  Anesthesia Type:General  Level of Consciousness: awake, alert , and oriented  Airway & Oxygen Therapy: Patient Spontanous Breathing and Patient connected to nasal cannula oxygen  Post-op Assessment: Report given to RN and Post -op Vital signs reviewed and stable  Post vital signs: Reviewed and stable  Last Vitals:  Vitals Value Taken Time  BP 117/83 04/06/22 1226  Temp    Pulse 91 04/06/22 1229  Resp 21 04/06/22 1229  SpO2 98 % 04/06/22 1229  Vitals shown include unvalidated device data.  Last Pain:  Vitals:   04/06/22 0703  TempSrc:   PainSc: 0-No pain         Complications: No notable events documented.

## 2022-04-06 NOTE — H&P (Signed)
  The note originally documented on this encounter has been moved the the encounter in which it belongs.    Pharynx: Oropharynx is clear.  Cardiovascular:     Rate and Rhythm: Normal rate and regular rhythm.     Pulses: Normal pulses.     Heart sounds: Normal heart sounds.  Pulmonary:     Effort: Pulmonary effort is normal.     Breath sounds: Normal breath sounds.  Abdominal:     General: Bowel sounds are normal.     Palpations: Abdomen is soft.  Musculoskeletal:     Right lower leg: No edema.     Left lower leg: No edema.  Skin:    General: Skin is warm and dry.  Neurological:     Mental Status: She is alert and oriented to person, place, and time.  Psychiatric:        Mood and Affect: Mood normal.        Behavior: Behavior normal.        Thought Content: Thought content normal.        Judgment: Judgment normal.     Imaging: IR Radiologist Eval & Mgmt  Result Date: 03/07/2022 EXAM: ESTABLISHED PATIENT OFFICE VISIT CHIEF COMPLAINT: Worsening headaches, chemosis and diplopia. 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 Previously treated with 2 cycles of pipeline flow diversion embolization with significant resolution of symptoms. Over the past few weeks the patient reports return of double vision while looking up, and also on bilateral conjugate gaze deviation. This is also associated with the sharp sudden headache in the left supraorbital region which is only transient but faintly debilitating. She has also noted increased redness in the left eye globe. She has also noticed worsening visual acuity in the left eye. She denies any nausea, vomiting, vertigo, speech difficulties, pulsatile tinnitus, or passing out spells. She denies any symptoms of episodes of loss of consciousness, or seizure-like activity. She denies any weakness in the upper or lower extremities or changes in coordination or balance. Past Medical History: Unchanged. Medications: Unchanged. Allergies: Unchanged. Social History: Unchanged. Family History: Unchanged. REVIEW OF SYSTEMS: Negative unless as mentioned above. PHYSICAL EXAMINATION: Awake, alert, oriented to time, place, space. Speech and comprehension normal. Appears in no acute distress. Normal affect. There is decreased reduction on horizontal gaze bilaterally worse on the left. Proptosis and ecchymosis of the left eye is evident. There is edema of the upper and lower eyelids. No facial asymmetry noted otherwise. Tongue midline. Speech normal in articulation. Grossly coordination and station and gait intact. ASSESSMENT AND PLAN: The patient's most recent MRI of the brain of February 15, 2022 was evaluated and compared to previous imaging studies. Most recent MRI of the brain demonstrates mild opacification of the cavernous sinus on the left. There is obvious proptosis with prominence of the superior ophthalmic vein, with edema of the left medial rectus muscle, and to a lesser degree the left  lateral rectus. 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 Given the patient's progression of symptoms as described, the option of a diagnostic catheter arteriogram under general anesthesia with intent to retreat either with trans venous or trans arterial route was reviewed. The patient is agreeable to proceed with the diagnostic catheter arteriogram with potential for treatment in order to prevent further exacerbation of symptoms,  especially which could lead to loss of vision in left eye. The patient is aware of the procedure and its potential risks having undergone 2 of these already. The risk of an ischemic stroke event of 1-2% was reviewed. The patient was asked to continue with her present medications for now. The patient would like to have the procedure scheduled for the middle of January 2024. Patient advised to call for concerns or questions. Electronically Signed   By: Luanne Bras M.D.   On: 03/07/2022 08:56    Labs:  CBC: Recent Labs    04/06/22 0708  WBC 6.9  HGB 12.6  HCT 39.1  PLT 224    COAGS: Recent Labs    04/06/22 0708  INR 1.0    BMP: Recent Labs    04/06/22 0708  NA 136  K 3.3*  CL 103  CO2 25  GLUCOSE 103*  BUN 25*  CALCIUM 9.1  CREATININE 1.08*  GFRNONAA 58*    LIVER FUNCTION TESTS: No results for input(s): "BILITOT", "AST", "ALT", "ALKPHOS", "PROT", "ALBUMIN" in the last 8760 hours.  TUMOR MARKERS: No results for input(s): "AFPTM", "CEA", "CA199", "CHROMGRNA" in the last 8760 hours.  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  Risks and benefits of this procedure were discussed with the patient including, but not limited to bleeding, infection, vascular injury or contrast induced renal failure. The risk of stroke was also discussed.   This interventional procedure involves the use of X-rays and because of the nature of the planned procedure, it is possible that we will have prolonged use of X-ray fluoroscopy.  Potential radiation risks to you include (but are not limited to) the following: - A slightly elevated risk for cancer  several years later in life. This risk is typically less than 0.5% percent. This risk is low in comparison to the normal incidence of human cancer, which is 33% for women and 50% for men according to the [place]. - Radiation induced injury can include skin redness, resembling a rash, tissue breakdown / ulcers and hair loss (which can be temporary or permanent).   The likelihood of either of these occurring depends on the difficulty of the procedure and whether you are sensitive to radiation due to previous procedures, disease, or genetic conditions.   IF your procedure requires a prolonged use of radiation, you will be notified and given written instructions for further action.  It is your responsibility to monitor the irradiated area for the 2 weeks following the procedure and to notify your physician if you are concerned that you have suffered a radiation induced injury.    All of the patient's questions were answered, patient is agreeable to proceed. She has been NPO. She did not take her aspirin but she did take 45 mg Brilinta this morning. She will receive 325 mg aspirin and an additional 90 mg of Brilinta pre-procedure.   Consent signed and in chart.  Thank you for this interesting consult.  I greatly enjoyed meeting Rose Marquez and look forward to participating in their care.  A copy of this report was sent to the requesting  provider on this date.  Electronically Signed: Soyla Dryer, AGACNP-BC [phone] 04/06/2022, 8:10 AM   I spent a total of  30 Minutes   in face  to face in clinical consultation, greater than 50% of which was counseling/coordinating care for diagnostic cerebral angiogram with possible intervention.

## 2022-04-06 NOTE — Progress Notes (Signed)
Patient evaluated in PACU following this morning's endovascular embolization of residual left ICA/CC fistula with placement of a single pipeline shield flow diverter.   She is sleepy but alert and answering all questions appropriately. She endorses an intermittent headache above/behind her left eye. She continues to have diplopia which is slightly worse than it was pre-procedure. Dr. Estanislado Pandy said this is to be expected. She is moving all extremities without difficulty.   Her right groin site is clean, soft, dry and with only mild tenderness.   She is awaiting bed availability in the Neuro ICU. Tentative plans for discharge home tomorrow.   IR will see the patient in the morning.   Soyla Dryer, [place] [phone] 04/06/2022, 3:39 PM

## 2022-04-06 NOTE — Progress Notes (Signed)
ANTICOAGULATION CONSULT NOTE - Initial Consult  Pharmacy Consult for Heparin Indication:  s/p embolization of residual left ICA/CC fistula  No Known Allergies  Patient Measurements: Height: 5\' 7"  (170.2 cm) Weight: 90.7 kg (200 lb) IBW/kg (Calculated) : 61.6 Heparin Dosing Weight: 81 kg  Vital Signs: Temp: 98.1 F (36.7 C) (01/10 1400) Temp Source: Oral (01/10 0642) BP: 112/81 (01/10 1500) Pulse Rate: 82 (01/10 1500)  Labs: Recent Labs    04/06/22 0708 04/06/22 1022  HGB 12.6  --   HCT 39.1  --   PLT 224  --   LABPROT 12.6  --   INR 1.0  --   CREATININE 1.08* 1.00    Estimated Creatinine Clearance: 66.5 mL/min (by C-G formula based on SCr of 1 mg/dL).   Medical History: Past Medical History:  Diagnosis Date   Brain aneurysm 08/2020   Gallstones    Glaucoma    HLD (hyperlipidemia)    Hypertension     Medications:  Medications Prior to Admission  Medication Sig Dispense Refill Last Dose   aspirin 81 MG chewable tablet Chew 1 tablet (81 mg total) by mouth daily. 30 tablet 3 04/05/2022   dorzolamide-timolol (COSOPT) 22.3-6.8 MG/ML ophthalmic solution Place 1 drop into the left eye 2 (two) times daily.   04/04/2022   hydrochlorothiazide (HYDRODIURIL) 25 MG tablet Take 1 tablet (25 mg total) by mouth 2 (two) times daily. 30 tablet 0 04/05/2022   Multiple Vitamin (MULTIVITAMIN WITH MINERALS) TABS tablet Take 1 tablet by mouth daily.   04/04/2022   olmesartan (BENICAR) 40 MG tablet Take 40 mg by mouth daily.   04/05/2022   rosuvastatin (CRESTOR) 5 MG tablet Take 5 mg by mouth every evening.   04/05/2022   ticagrelor (BRILINTA) 90 MG TABS tablet Take 1 tablet (90 mg total) by mouth 2 (two) times daily. (Patient taking differently: Take 45 mg by mouth daily.) 60 tablet 3 04/06/2022 at 0500    Assessment: [name] s/p embolization of residual left ICA/CC fistula. To begin heparin post-op - started in PACU at 500 units/hr. No AC PTA. CBC ok at baseline.  Goal of Therapy:   Heparin level 0.1-0.25 units/ml Monitor platelets by anticoagulation protocol: Yes   Plan:  Increase heparin to 800 units/hr. Heparin to be off tomorrow 0800 for sheath removal. Will f/u 6-8 hr heparin level  [name], PharmD, BCPS Please see amion for complete clinical pharmacist phone list 04/06/2022,3:30 PM

## 2022-04-06 NOTE — Anesthesia Preprocedure Evaluation (Signed)
Anesthesia Evaluation  Patient identified by MRN, date of birth, ID band Patient awake    Reviewed: Allergy & Precautions, NPO status , Patient's Chart, lab work & pertinent test results  History of Anesthesia Complications Negative for: history of anesthetic complications  Airway Mallampati: III  TM Distance: >3 FB Neck ROM: Full    Dental  (+) Teeth Intact, Dental Advisory Given   Pulmonary neg pulmonary ROS   breath sounds clear to auscultation       Cardiovascular hypertension, Pt. on medications (-) angina (-) Past MI and (-) CHF  Rhythm:Regular   1. Left ventricular ejection fraction, by estimation, is 65 to 70%. The  left ventricle has normal function. The left ventricle has no regional  wall motion abnormalities. There is moderate concentric left ventricular  hypertrophy. Left ventricular  diastolic parameters are consistent with Grade I diastolic dysfunction  (impaired relaxation).   2. Right ventricular systolic function is normal. The right ventricular  size is normal.   3. Left atrial size was mildly dilated.   4. The mitral valve is normal in structure. No evidence of mitral valve  regurgitation.   5. The aortic valve is tricuspid. Aortic valve regurgitation is not  visualized.   6. The inferior vena cava is normal in size with greater than 50%  respiratory variability, suggesting right atrial pressure of 3 mmHg.      Neuro/Psych  left ICA cavernous aneurysm with enlargement of the left superior ophthalmic vein. She is s/p diagnostic cerebral arteriogram 09/17/20. 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   negative psych ROS   GI/Hepatic negative GI ROS, Neg liver ROS,,,  Endo/Other  No results found for: "HGBA1C"   Renal/GU negative Renal ROSLab Results      Component                 Value               Date                      CREATININE               1.08 (H)            04/06/2022          ]      Musculoskeletal negative musculoskeletal ROS (+)    Abdominal   Peds  Hematology  (+) Blood dyscrasia Lab Results      Component                Value               Date                      WBC                      6.9                 04/06/2022                HGB                      12.6                04/06/2022  HCT                      39.1                04/06/2022                MCV                      84.4                04/06/2022                PLT                      224                 04/06/2022             brilinta   Anesthesia Other Findings   Reproductive/Obstetrics                             Anesthesia Physical Anesthesia Plan  ASA: 3  Anesthesia Plan: General   Post-op Pain Management: Minimal or no pain anticipated   Induction: Intravenous  PONV Risk Score and Plan: 3 and Ondansetron and Dexamethasone  Airway Management Planned: Oral ETT  Additional Equipment: Arterial line  Intra-op Plan:   Post-operative Plan: Extubation in OR  Informed Consent: I have reviewed the patients History and Physical, chart, labs and discussed the procedure including the risks, benefits and alternatives for the proposed anesthesia with the patient or authorized representative who has indicated his/her understanding and acceptance.     Dental advisory given  Plan Discussed with: CRNA  Anesthesia Plan Comments:        Anesthesia Quick Evaluation

## 2022-04-06 NOTE — Sedation Documentation (Signed)
ACT 196 

## 2022-04-07 ENCOUNTER — Encounter (HOSPITAL_COMMUNITY): Payer: Self-pay | Admitting: Interventional Radiology

## 2022-04-07 LAB — BASIC METABOLIC PANEL
Anion gap: 8 (ref 5–15)
BUN: 16 mg/dL (ref 8–23)
CO2: 24 mmol/L (ref 22–32)
Calcium: 8.4 mg/dL — ABNORMAL LOW (ref 8.9–10.3)
Chloride: 107 mmol/L (ref 98–111)
Creatinine, Ser: 0.91 mg/dL (ref 0.44–1.00)
GFR, Estimated: >60 mL/min (ref >60)
Glucose, Bld: 128 mg/dL — ABNORMAL HIGH (ref 70–99)
Potassium: 3.4 mmol/L — ABNORMAL LOW (ref 3.5–5.1)
Sodium: 139 mmol/L (ref 135–145)

## 2022-04-07 LAB — CBC WITH DIFFERENTIAL/PLATELET
Abs Immature Granulocytes: 0.06 10*3/uL (ref 0.00–0.07)
Basophils Absolute: 0 10*3/uL (ref 0.0–0.1)
Basophils Relative: 0 %
Eosinophils Absolute: 0 10*3/uL (ref 0.0–0.5)
Eosinophils Relative: 0 %
HCT: 34.6 % — ABNORMAL LOW (ref 36.0–46.0)
Hemoglobin: 11.5 g/dL — ABNORMAL LOW (ref 12.0–15.0)
Immature Granulocytes: 1 %
Lymphocytes Relative: 8 %
Lymphs Abs: 1 10*3/uL (ref 0.7–4.0)
MCH: 27.3 pg (ref 26.0–34.0)
MCHC: 33.2 g/dL (ref 30.0–36.0)
MCV: 82 fL (ref 80.0–100.0)
Monocytes Absolute: 0.3 10*3/uL (ref 0.1–1.0)
Monocytes Relative: 3 %
Neutro Abs: 10.8 10*3/uL — ABNORMAL HIGH (ref 1.7–7.7)
Neutrophils Relative %: 88 %
Platelets: 215 10*3/uL (ref 150–400)
RBC: 4.22 MIL/uL (ref 3.87–5.11)
RDW: 15 % (ref 11.5–15.5)
WBC: 12.1 10*3/uL — ABNORMAL HIGH (ref 4.0–10.5)
nRBC: 0 % (ref 0.0–0.2)

## 2022-04-07 LAB — HEPARIN LEVEL (UNFRACTIONATED): Heparin Unfractionated: 0.41 IU/mL (ref 0.30–0.70)

## 2022-04-07 MED ORDER — HEPARIN (PORCINE) 25000 UT/250ML-% IV SOLN: 650.0000 [IU]/h | INTRAVENOUS | Status: DC

## 2022-04-07 MED ORDER — TICAGRELOR 90 MG PO TABS: ORAL_TABLET | ORAL | 3 refills | Status: DC

## 2022-04-07 MED ORDER — TICAGRELOR 90 MG PO TABS: 45.0000 mg | ORAL_TABLET | ORAL | Status: DC

## 2022-04-07 NOTE — Progress Notes (Signed)
Patient's PIV's removed and belongings gathered. Daughter is at bedside to also receive discharge paperwork. Pt taken downstairs via wheelchair.   , Rose Marquez

## 2022-04-07 NOTE — Discharge Instructions (Signed)
No bending, stooping, straining, or lifting more than 10 lbs for two weeks No driving for two weeks Scheduler will call you for your outpatient follow up appointment, to occur in approximately 2 weeks Take your Brilinta, 45 mg, twice daily for 7 days Then, take Brilinta 45 mg once daily for at least 7 days and until your follow up appointment. You may call [phone] or [phone] with questions and concerns

## 2022-04-07 NOTE — Progress Notes (Signed)
ANTICOAGULATION CONSULT NOTE Pharmacy Consult for Heparin Indication:  s/p embolization of residual left ICA/CC fistula Brief A/P: Heparin level supratherapeutic Decrease Heparin rate  No Known Allergies  Patient Measurements: Height: 5\' 7"  (170.2 cm) Weight: 90.7 kg (200 lb) IBW/kg (Calculated) : 61.6 Heparin Dosing Weight: 81 kg  Vital Signs: Temp: 98.3 F (36.8 C) (01/11 0000) Temp Source: Oral (01/11 0000) BP: 98/75 (01/10 2200) Pulse Rate: 80 (01/10 2200)  Labs: Recent Labs    04/06/22 0708 04/06/22 1022  HGB 12.6  --   HCT 39.1  --   PLT 224  --   LABPROT 12.6  --   INR 1.0  --   CREATININE 1.08* 1.00     Estimated Creatinine Clearance: 66.5 mL/min (by C-G formula based on SCr of 1 mg/dL).   Assessment: 63 y.o. female s/p embolization of left ICA/CC fistula for heparin.   Goal of Therapy:  Heparin level 0.1-0.25 units/ml Monitor platelets by anticoagulation protocol: Yes   Plan:  Decrease Heparin 650 units/hr  Phillis Knack, PharmD, BCPS  04/07/2022,1:01 AM

## 2022-04-07 NOTE — Discharge Summary (Signed)
Patient ID: Rose Marquez MRN: [id number] DOB/AGE: 01-Nov-1960 62 y.o.  Admit date: 04/06/2022 Discharge date: 04/07/2022  Supervising Physician: Rose Marquez  Patient Status: [facility] - In[facility]pt  Admission Diagnoses: internal carotid artery para cavernous aneurysm and CC fistula previously treated with 2 cycles of pipeline flow diversion embolization  Discharge Diagnoses:  Principal Problem:   Brain aneurysm   Discharged Condition: good  Hospital Course: patient presented to [facility] IR yesterday for planned procedure of angiogram and underwent endovascular embolization of residual left ICA/CC fistula with placement of a single pipeline shield flow diverter.  The procedure was uneventful.  Overnight observation uneventful.  She endorses some headache this morning that is resolved after taking tylenol.  She feels as though her left eye does not move freely, which is similar to pre-procedure.  Mild improvement of peri-ocular swelling and scleral redness this morning.  She is discharging home with daughter.  All questions answered.  Consults: None  Significant Diagnostic Studies: angiography: See procedural note  Treatments: treatment of left ICA/CC fistula  Discharge Exam: Blood pressure 101/83, pulse 92, temperature 97.9 F (36.6 C), temperature source Oral, resp. rate 17, height 5\' 7"  (1.702 m), weight 200 lb (90.7 kg), SpO2 96 %.  Physical Exam Constitutional:      General: She is not in acute distress. HENT:     Head: Normocephalic and atraumatic.  Eyes:     Comments: Mild swelling left periorbital region, mild scleral redness, mild delay in tracking of left eye, difficulty focusing with left eye.  Neurological:     Mental Status: She is alert.      Disposition: Discharge disposition: 01-Home or Self Care       Discharge Instructions     Call MD for:  difficulty breathing, headache or visual disturbances   Complete by: As directed    Call MD for:  extreme fatigue    Complete by: As directed    Call MD for:  persistant dizziness or light-headedness   Complete by: As directed    Call MD for:  persistant nausea and vomiting   Complete by: As directed    Call MD for:  redness, tenderness, or signs of infection (pain, swelling, redness, odor or green/yellow discharge around incision site)   Complete by: As directed    Call MD for:  severe uncontrolled pain   Complete by: As directed    Diet - low sodium heart healthy   Complete by: As directed    Discharge instructions   Complete by: As directed    Take 45mg  Brilinta twice daily for 7 days, then take 45mg  Brilinta once daily until outpatient follow up appointment.  No bending, stooping, straining, or lifting more than 10 lbs for 2 weeks.  No driving for 2 weeks.   Increase activity slowly   Complete by: As directed    Remove dressing in 24 hours   Complete by: As directed    Take care not to do much strenuous activity, for example flights of stairs, to reduce risk of bleeding at the groin.      Allergies as of 04/07/2022   No Known Allergies      Medication List     TAKE these medications    Aspirin Low Dose 81 MG chewable tablet Generic drug: aspirin Chew 1 tablet (81 mg total) by mouth daily.   Brilinta 90 MG Tabs tablet Generic drug: ticagrelor Take 1 tablet (90 mg total) by mouth 2 (two) times daily. What changed:  how much to take when to take this   dorzolamide-timolol 2-0.5 % ophthalmic solution Commonly known as: COSOPT Place 1 drop into the left eye 2 (two) times daily.   hydrochlorothiazide 25 MG tablet Commonly known as: HYDRODIURIL Take 1 tablet (25 mg total) by mouth 2 (two) times daily.   multivitamin with minerals Tabs tablet Take 1 tablet by mouth daily.   olmesartan 40 MG tablet Commonly known as: BENICAR Take 40 mg by mouth daily.   rosuvastatin 5 MG tablet Commonly known as: CRESTOR Take 5 mg by mouth every evening.          Electronically  Signed: Pasty Spillers, PA 04/07/2022, 10:22 AM   I have spent Greater Than 30 Minutes discharging Rose Marquez.

## 2022-04-08 NOTE — Anesthesia Postprocedure Evaluation (Signed)
Anesthesia Post Note  Patient: Rose Marquez  Procedure(s) Performed: Aneurysm embolization     Patient location during evaluation: PACU Anesthesia Type: General Level of consciousness: awake and patient cooperative Pain management: pain level controlled Vital Signs Assessment: post-procedure vital signs reviewed and stable Respiratory status: spontaneous breathing, nonlabored ventilation and respiratory function stable Cardiovascular status: blood pressure returned to baseline and stable Postop Assessment: no apparent nausea or vomiting Anesthetic complications: no  No notable events documented.  Last Vitals:  Vitals:   04/07/22 0822 04/07/22 1000  BP:  106/74  Pulse: 92 89  Resp: 17 (!) 23  Temp:    SpO2: 96% 97%    Last Pain:  Vitals:   04/07/22 0822  TempSrc:   PainSc: 0-No pain                  

## 2022-04-20 ENCOUNTER — Ambulatory Visit (HOSPITAL_COMMUNITY)
Admission: RE | Admit: 2022-04-20 | Discharge: 2022-04-20 | Disposition: A | Discharge status: Home / Self Care | Payer: BC Managed Care – PPO | Source: Ambulatory Visit | Attending: Physician Assistant | Admitting: Physician Assistant

## 2022-04-20 DIAGNOSIS — I671 Cerebral aneurysm, nonruptured: Secondary | ICD-10-CM

## 2022-04-21 HISTORY — PX: IR RADIOLOGIST EVAL & MGMT: IMG5224

## 2022-08-04 ENCOUNTER — Other Ambulatory Visit (HOSPITAL_COMMUNITY): Payer: Self-pay | Admitting: Interventional Radiology

## 2022-08-04 DIAGNOSIS — I671 Cerebral aneurysm, nonruptured: Secondary | ICD-10-CM

## 2022-08-09 ENCOUNTER — Ambulatory Visit
Admission: RE | Admit: 2022-08-09 | Discharge: 2022-08-09 | Disposition: A | Discharge status: Home / Self Care | Payer: BC Managed Care – PPO | Source: Ambulatory Visit | Attending: Interventional Radiology | Admitting: Interventional Radiology

## 2022-08-09 DIAGNOSIS — I671 Cerebral aneurysm, nonruptured: Secondary | ICD-10-CM

## 2022-08-23 ENCOUNTER — Telehealth: Payer: Self-pay | Admitting: Student

## 2022-08-23 NOTE — Telephone Encounter (Signed)
Patient was spoken with regarding her recent imaging results. Patient will be contacted by IR schedulers for follow-up MR Brain and MRA in 6 months time. Additionally, Dr Corliss Skains stated that patient could taper off of Brilinta by taking one half of a pill every other day for one week and then stopping her Brilinta after that point. This was communicated to the patient who voiced her understanding. Patient was also advised to contact NIR service if she experiences worsening proptosis, eye pain, eye drooping, or other symptoms with her eye. Patient again voiced her understanding.  Kennieth Francois, PA-C 08/23/2022 3:50 PM

## 2022-09-21 IMAGING — CT CT HEAD W/O CM
3 of 4 series · 14 of 47 positions shown, 16 images · non-contrast
Comparison: None.

CLINICAL DATA: Headache

EXAM:
CT HEAD WITHOUT CONTRAST
TECHNIQUE: Contiguous axial images were obtained from the base of the skull
through the vertex without intravenous contrast.

[Series 4: coronal soft tissue · coronal · 0.30mm/px · 3 of 71 slices shown]
[im 24/71  brain]
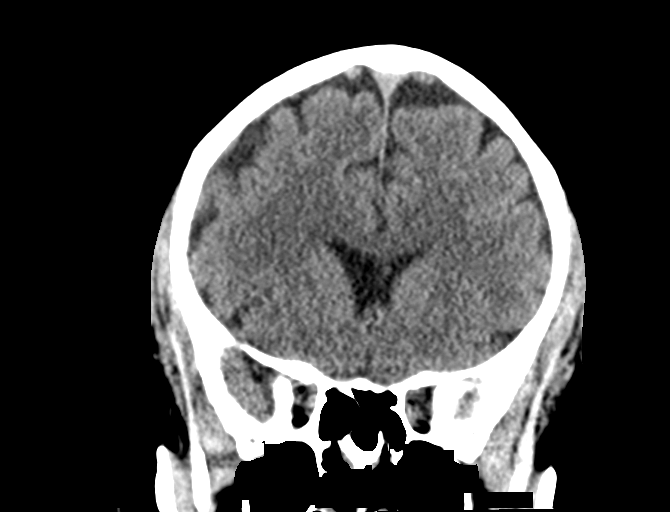
[im 32/71  brain]
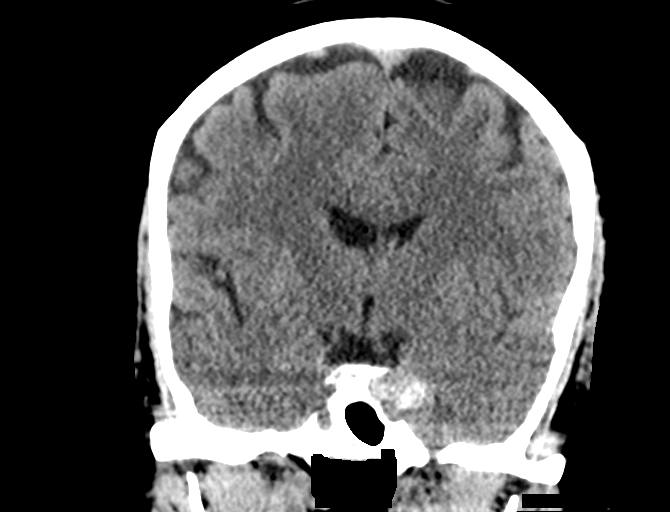
[im 39/71  brain]
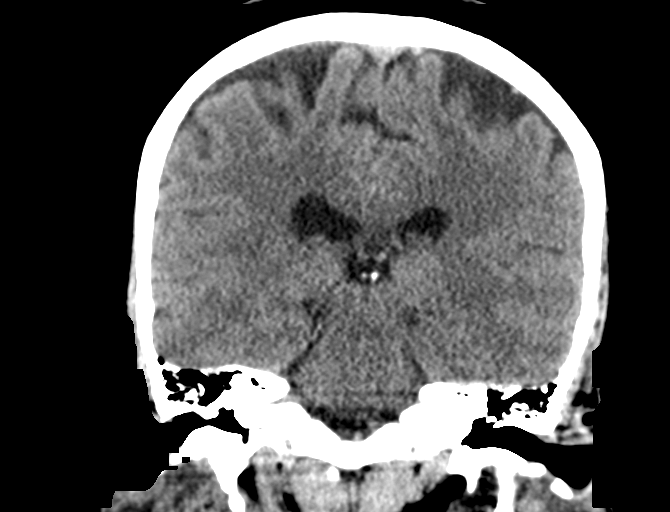

[Series 5: sagittal soft tissue · sagittal · 0.31mm/px · 3 of 61 slices shown]
[im 21/61  brain]
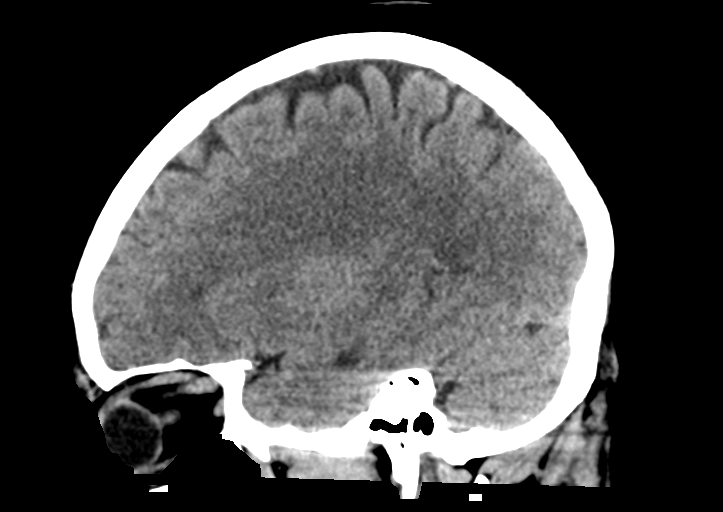
[im 31/61  brain]
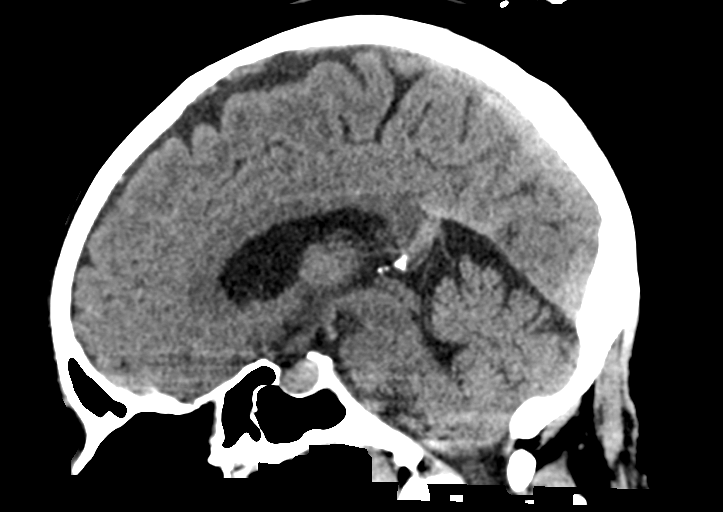
[im 41/61  brain]
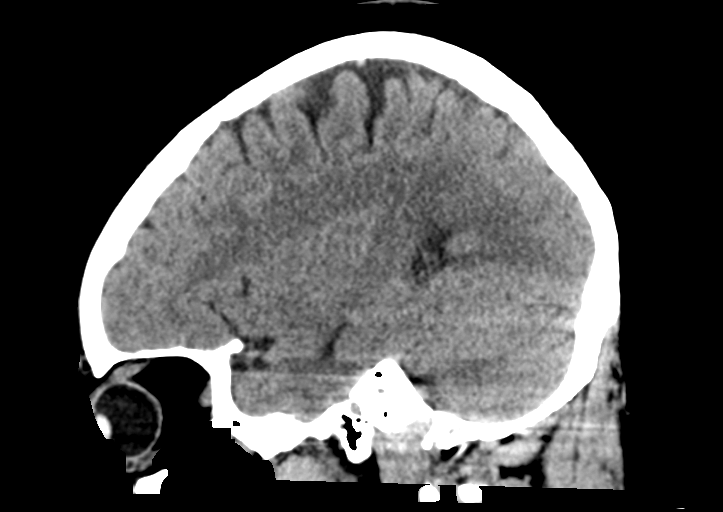

[Series 7: axial soft tissue · axial · 0.39mm/px · z∈[-120,+6]mm · 8 of 51 slices shown, 10 images]
[im 6/51  brain]
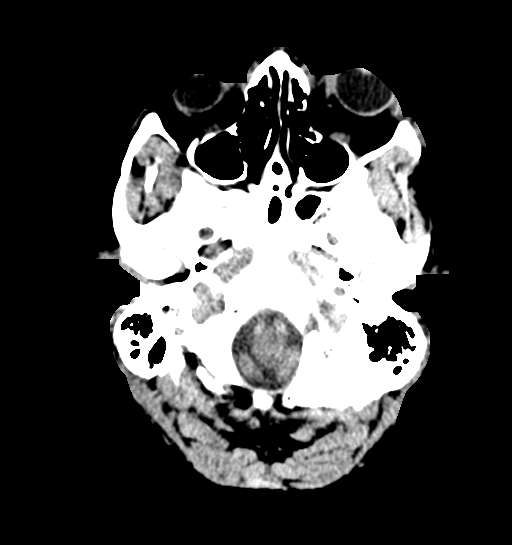
[im 6/51  bone]
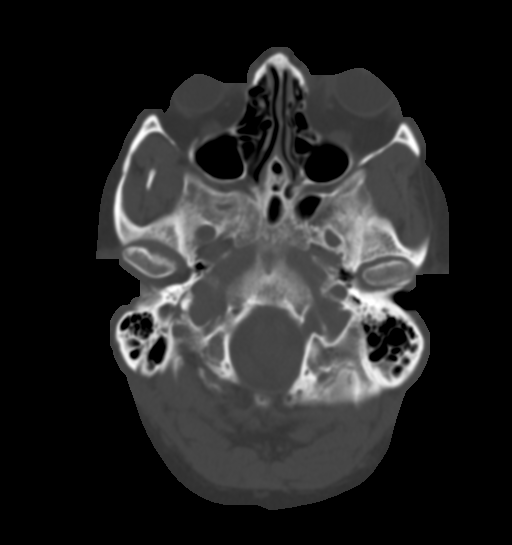
[im 12/51  brain]
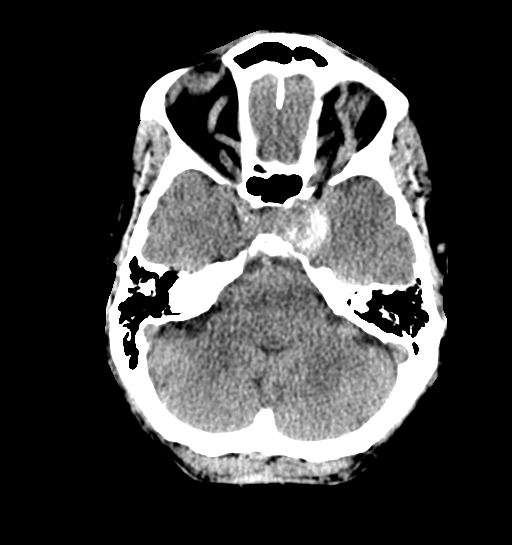
[im 18/51  brain]
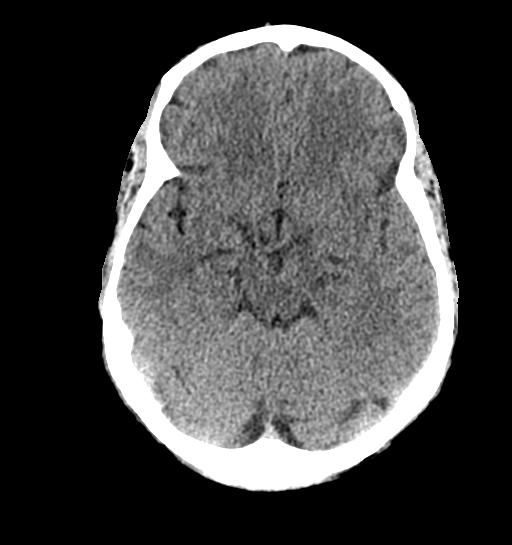
[im 24/51  brain]
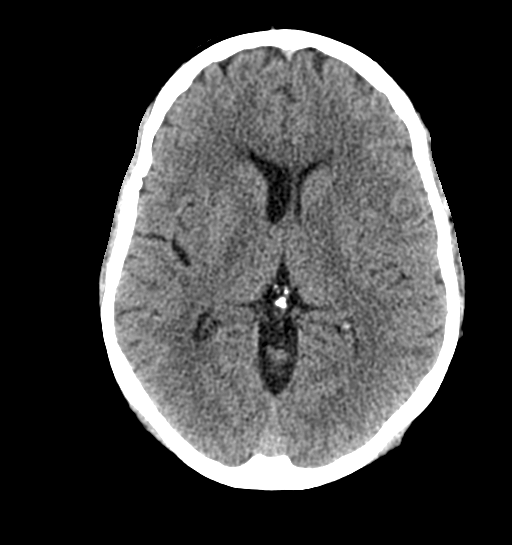
[im 30/51  brain]
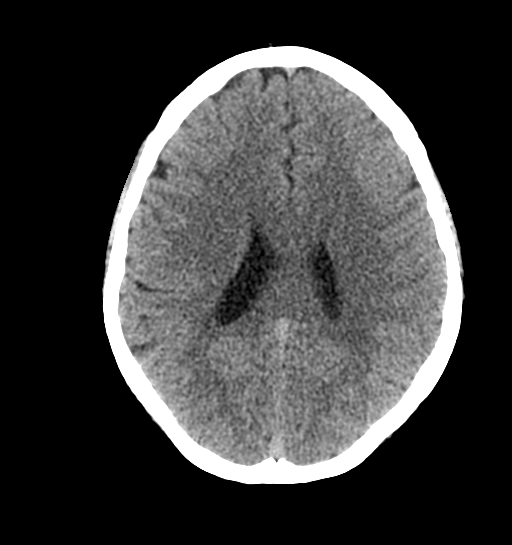
[im 30/51  bone]
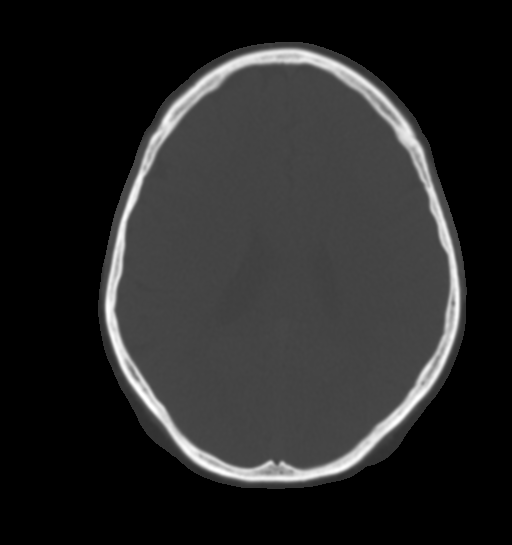
[im 36/51  brain]
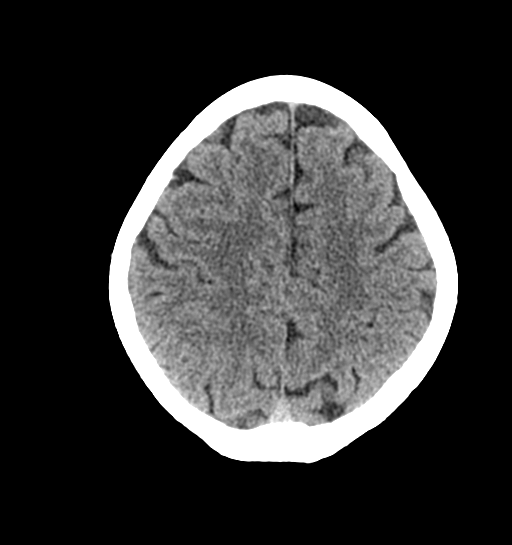
[im 42/51  brain]
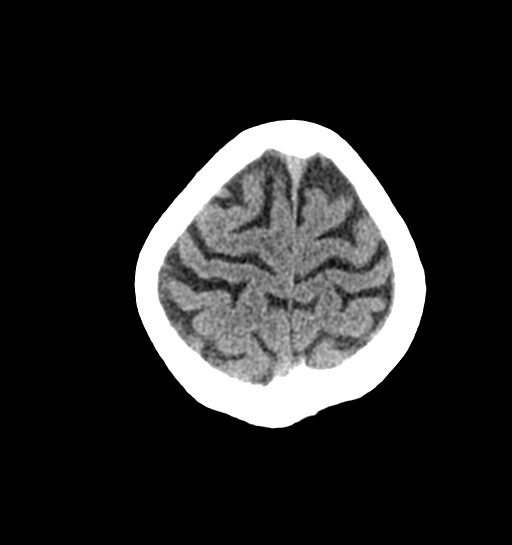
[im 48/51  brain]
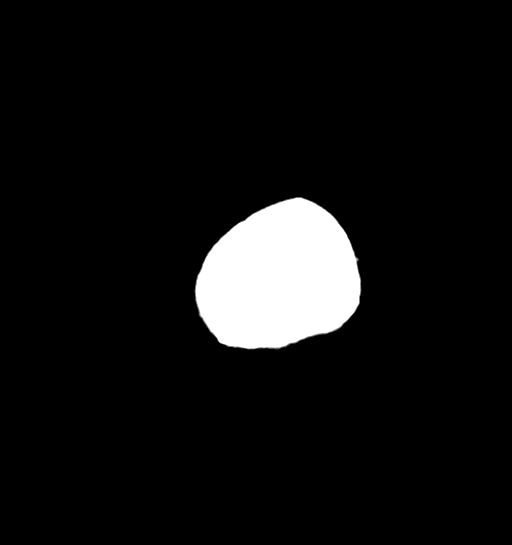

[14 of 47 positions shown; findings below may reference images not displayed]

FINDINGS: Brain: No acute territorial infarction or hemorrhage is visualized.
Extra-axial mass adjacent to the medial left temporal lobe, appears
to be associated with cavernous sinus. This measures 19 mm AP by 22
mm transverse. Peripheral high density within the lesion. Mild mass
effect on adjacent temporal lobe. Ventricles nonenlarged

Vascular: No unexpected calcification.

Skull: No fracture

Sinuses/Orbits: Sinuses are clear.  The globes appear intact.

Other: None
IMPRESSION: 19 x 22 mm left cavernous sinus lesion which could represent a mass
or giant aneurysm. Contrast-enhanced MRI is recommended for further
evaluation.

## 2022-10-27 ENCOUNTER — Other Ambulatory Visit (HOSPITAL_COMMUNITY): Payer: Self-pay | Admitting: Interventional Radiology

## 2022-10-27 DIAGNOSIS — I671 Cerebral aneurysm, nonruptured: Secondary | ICD-10-CM

## 2022-11-01 ENCOUNTER — Ambulatory Visit (HOSPITAL_COMMUNITY)
Admission: RE | Admit: 2022-11-01 | Discharge: 2022-11-01 | Disposition: A | Discharge status: Home / Self Care | Payer: BC Managed Care – PPO | Source: Ambulatory Visit | Attending: Interventional Radiology | Admitting: Interventional Radiology

## 2022-11-01 DIAGNOSIS — I671 Cerebral aneurysm, nonruptured: Secondary | ICD-10-CM | POA: Diagnosis present

## 2022-11-01 MED ORDER — GADOBUTROL 1 MMOL/ML IV SOLN
9.0000 mL | Freq: Once | INTRAVENOUS | Status: AC | PRN
  Administered 2022-11-01: 9 mL via INTRAVENOUS

## 2022-11-03 ENCOUNTER — Other Ambulatory Visit (HOSPITAL_COMMUNITY): Payer: Self-pay | Admitting: Interventional Radiology

## 2022-11-03 ENCOUNTER — Other Ambulatory Visit: Payer: Self-pay | Admitting: Student

## 2022-11-03 DIAGNOSIS — I671 Cerebral aneurysm, nonruptured: Secondary | ICD-10-CM

## 2022-11-03 MED ORDER — TICAGRELOR 60 MG PO TABS: 60.0000 mg | ORAL_TABLET | Freq: Two times a day (BID) | ORAL | 0 refills | Status: DC

## 2022-11-07 ENCOUNTER — Encounter (HOSPITAL_COMMUNITY): Payer: Self-pay | Admitting: Interventional Radiology

## 2022-11-07 ENCOUNTER — Other Ambulatory Visit: Payer: Self-pay

## 2022-11-07 NOTE — Progress Notes (Signed)
Spoke with pt for pre-op call. Pt denies HTN, Diabetes or cardiac history. Pt has had a brain aneurysm 6/22.  Shower instructions given to pt.

## 2022-11-08 ENCOUNTER — Encounter (HOSPITAL_COMMUNITY): Payer: Self-pay | Admitting: Anesthesiology

## 2022-11-08 ENCOUNTER — Other Ambulatory Visit: Payer: Self-pay | Admitting: Radiology

## 2022-11-08 ENCOUNTER — Other Ambulatory Visit: Payer: Self-pay | Admitting: Student

## 2022-11-08 DIAGNOSIS — I671 Cerebral aneurysm, nonruptured: Secondary | ICD-10-CM

## 2022-11-08 DIAGNOSIS — Z01818 Encounter for other preprocedural examination: Secondary | ICD-10-CM

## 2022-11-08 NOTE — Anesthesia Preprocedure Evaluation (Signed)
Anesthesia Evaluation  Patient identified by MRN, date of birth, ID band Patient awake    Reviewed: Allergy & Precautions, NPO status , Patient's Chart, lab work & pertinent test results  Airway Mallampati: III  TM Distance: >3 FB Neck ROM: Full    Dental no notable dental hx. (+) Teeth Intact, Dental Advisory Given   Pulmonary neg pulmonary ROS   Pulmonary exam normal breath sounds clear to auscultation       Cardiovascular hypertension, Pt. on medications Normal cardiovascular exam Rhythm:Regular Rate:Normal  TTE 2022  1. Left ventricular ejection fraction, by estimation, is 65 to 70%. The  left ventricle has normal function. The left ventricle has no regional  wall motion abnormalities. There is moderate concentric left ventricular  hypertrophy. Left ventricular  diastolic parameters are consistent with Grade I diastolic dysfunction  (impaired relaxation).   2. Right ventricular systolic function is normal. The right ventricular  size is normal.   3. Left atrial size was mildly dilated.   4. The mitral valve is normal in structure. No evidence of mitral valve  regurgitation.   5. The aortic valve is tricuspid. Aortic valve regurgitation is not  visualized.   6. The inferior vena cava is normal in size with greater than 50%  respiratory variability, suggesting right atrial pressure of 3 mmHg.     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  Reproductive/Obstetrics                             Anesthesia Physical Anesthesia Plan  ASA:  3  Anesthesia Plan: General   Post-op Pain Management: Tylenol PO (pre-op)* and Minimal or no pain anticipated   Induction: Intravenous  PONV Risk Score and Plan: 3 and Midazolam, Dexamethasone and Ondansetron  Airway Management Planned: Oral ETT  Additional Equipment: Arterial line  Intra-op Plan:   Post-operative Plan: Extubation in OR  Informed Consent: I have reviewed the patients History and Physical, chart, labs and discussed the procedure including the risks, benefits and alternatives for the proposed anesthesia with the patient or authorized representative who has indicated his/her understanding and acceptance.     Dental advisory given  Plan Discussed with: CRNA  Anesthesia Plan Comments: (Patient cancelled by proceduralist. )       Anesthesia Quick Evaluation

## 2022-11-09 ENCOUNTER — Encounter (HOSPITAL_COMMUNITY): Payer: Self-pay | Admitting: Interventional Radiology

## 2022-11-09 ENCOUNTER — Ambulatory Visit (HOSPITAL_COMMUNITY)
Admission: RE | Admit: 2022-11-09 | Discharge: 2022-11-09 | Disposition: A | Discharge status: Home / Self Care | Payer: BC Managed Care – PPO | Source: Ambulatory Visit | Attending: Interventional Radiology | Admitting: Interventional Radiology

## 2022-11-09 ENCOUNTER — Encounter (HOSPITAL_COMMUNITY)
Admission: RE | Disposition: A | Discharge status: Home / Self Care | Payer: Self-pay | Source: Ambulatory Visit | Attending: Interventional Radiology

## 2022-11-09 ENCOUNTER — Other Ambulatory Visit: Payer: Self-pay

## 2022-11-09 ENCOUNTER — Encounter (HOSPITAL_COMMUNITY): Payer: Self-pay

## 2022-11-09 ENCOUNTER — Other Ambulatory Visit (HOSPITAL_COMMUNITY): Payer: Self-pay | Admitting: Interventional Radiology

## 2022-11-09 DIAGNOSIS — Z539 Procedure and treatment not carried out, unspecified reason: Secondary | ICD-10-CM | POA: Insufficient documentation

## 2022-11-09 DIAGNOSIS — Z01818 Encounter for other preprocedural examination: Secondary | ICD-10-CM

## 2022-11-09 DIAGNOSIS — I671 Cerebral aneurysm, nonruptured: Secondary | ICD-10-CM

## 2022-11-09 HISTORY — DX: Unspecified osteoarthritis, unspecified site: M19.90

## 2022-11-09 HISTORY — DX: Headache, unspecified: R51.9

## 2022-11-09 LAB — BASIC METABOLIC PANEL
Anion gap: 12 (ref 5–15)
BUN: 19 mg/dL (ref 8–23)
CO2: 26 mmol/L (ref 22–32)
Calcium: 9.4 mg/dL (ref 8.9–10.3)
Chloride: 102 mmol/L (ref 98–111)
Creatinine, Ser: 1.12 mg/dL — ABNORMAL HIGH (ref 0.44–1.00)
GFR, Estimated: 55 mL/min — ABNORMAL LOW (ref >60)
Glucose, Bld: 102 mg/dL — ABNORMAL HIGH (ref 70–99)
Potassium: 3.3 mmol/L — ABNORMAL LOW (ref 3.5–5.1)
Sodium: 140 mmol/L (ref 135–145)

## 2022-11-09 LAB — CBC WITH DIFFERENTIAL/PLATELET
Abs Immature Granulocytes: 0.01 10*3/uL (ref 0.00–0.07)
Basophils Absolute: 0 10*3/uL (ref 0.0–0.1)
Basophils Relative: 1 %
Eosinophils Absolute: 0.1 10*3/uL (ref 0.0–0.5)
Eosinophils Relative: 2 %
HCT: 44.4 % (ref 36.0–46.0)
Hemoglobin: 14.2 g/dL (ref 12.0–15.0)
Immature Granulocytes: 0 %
Lymphocytes Relative: 26 %
Lymphs Abs: 1.7 10*3/uL (ref 0.7–4.0)
MCH: 27 pg (ref 26.0–34.0)
MCHC: 32 g/dL (ref 30.0–36.0)
MCV: 84.6 fL (ref 80.0–100.0)
Monocytes Absolute: 0.5 10*3/uL (ref 0.1–1.0)
Monocytes Relative: 7 %
Neutro Abs: 4.2 10*3/uL (ref 1.7–7.7)
Neutrophils Relative %: 64 %
Platelets: 231 10*3/uL (ref 150–400)
RBC: 5.25 MIL/uL — ABNORMAL HIGH (ref 3.87–5.11)
RDW: 15.2 % (ref 11.5–15.5)
WBC: 6.5 10*3/uL (ref 4.0–10.5)
nRBC: 0 % (ref 0.0–0.2)

## 2022-11-09 LAB — PROTIME-INR
INR: 1 (ref 0.8–1.2)
Prothrombin Time: 13.7 seconds (ref 11.4–15.2)

## 2022-11-09 LAB — TYPE AND SCREEN
ABO/RH(D): O POS
Antibody Screen: NEGATIVE

## 2022-11-09 SURGERY — IR WITH ANESTHESIA
Anesthesia: General

## 2022-11-09 MED ORDER — SODIUM CHLORIDE 0.9 % IV SOLN: INTRAVENOUS | Status: DC

## 2022-11-09 MED ORDER — LACTATED RINGERS IV SOLN: INTRAVENOUS | Status: DC

## 2022-11-09 MED ORDER — MIDAZOLAM HCL 2 MG/2ML IJ SOLN
INTRAMUSCULAR | Status: AC
  Filled 2022-11-09: qty 2

## 2022-11-09 MED ORDER — ORAL CARE MOUTH RINSE: 15.0000 mL | Freq: Once | OROMUCOSAL | Status: AC

## 2022-11-09 MED ORDER — NIMODIPINE 30 MG PO CAPS: 0.0000 mg | ORAL_CAPSULE | ORAL | Status: DC

## 2022-11-09 MED ORDER — CHLORHEXIDINE GLUCONATE 0.12 % MT SOLN: 15.0000 mL | Freq: Once | OROMUCOSAL | Status: AC

## 2022-11-09 MED ORDER — CHLORHEXIDINE GLUCONATE 0.12 % MT SOLN
OROMUCOSAL | Status: AC
  Administered 2022-11-09: 15 mL via OROMUCOSAL
  Filled 2022-11-09: qty 15

## 2022-11-09 MED ORDER — FENTANYL CITRATE (PF) 100 MCG/2ML IJ SOLN
INTRAMUSCULAR | Status: AC
  Filled 2022-11-09: qty 2

## 2022-11-09 MED ORDER — ACETAMINOPHEN 500 MG PO TABS
1000.0000 mg | ORAL_TABLET | Freq: Once | ORAL | Status: AC
  Administered 2022-11-09: 1000 mg via ORAL
  Filled 2022-11-09: qty 2

## 2022-11-09 MED ORDER — CEFAZOLIN SODIUM-DEXTROSE 2-4 GM/100ML-% IV SOLN
INTRAVENOUS | Status: AC
  Filled 2022-11-09: qty 100

## 2022-11-09 MED ORDER — CEFAZOLIN SODIUM-DEXTROSE 2-4 GM/100ML-% IV SOLN
2.0000 g | INTRAVENOUS | Status: DC
  Filled 2022-11-09 (×2): qty 100

## 2022-11-09 NOTE — Progress Notes (Signed)
Per Dr. Corliss Skains, today's procedure is cancelled. Dr. Corliss Skains spoke to the patient and explained the reason for cancellation. PIV removed. Patient ambulatory to lobby with family friend.

## 2022-11-09 NOTE — H&P (Addendum)
Chief Complaint: Patient was seen in consultation today for Cerebral arteriogram with intervention of Left internal carotid artery cavernous fistula     Supervising Physician: Julieanne Cotton  Patient Status: [facility] - Out-pt  History of Present Illness: Rose Marquez is a 62 y.o. 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 This raises concern for residual fistula. 2. Asymmetric enlargement of the left superior ophthalmic vein. 3. Asymmetric left-sided proptosis. 4. Stable atrophy and white matter disease. This likely reflects the sequela of chronic microvascular ischemia. 5. No acute intracranial abnormality or significant interval change. 6. Flow gap in the left cavernous internal carotid artery corresponding to the covered stent. 7. No other significant proximal stenosis, aneurysm, or branch vessel occlusion within the Circle of Willis.  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   Gallstones    Glaucoma    Headache    HLD (hyperlipidemia)    Hypertension     Past Surgical History:  Procedure Laterality Date   CHOLECYSTECTOMY     IR 3D INDEPENDENT WKST  09/18/2020   IR ANGIO INTRA EXTRACRAN SEL COM CAROTID INNOMINATE BILAT MOD SED  09/18/2020   IR ANGIO INTRA EXTRACRAN SEL COM CAROTID INNOMINATE BILAT MOD SED  09/17/2020    IR ANGIO INTRA EXTRACRAN SEL INTERNAL CAROTID BILAT MOD SED  04/06/2022   IR ANGIO VERTEBRAL SEL VERTEBRAL UNI R MOD SED  09/18/2020   IR ANGIO VERTEBRAL SEL VERTEBRAL UNI R MOD SED  09/17/2020   IR ANGIOGRAM FOLLOW UP STUDY  04/06/2022   IR CT HEAD LTD  09/18/2020   IR CT HEAD LTD  12/16/2020   IR CT HEAD LTD  04/06/2022   IR NEURO EACH ADD'L AFTER BASIC UNI LEFT (MS)  04/06/2022   IR RADIOLOGIST EVAL & MGMT  10/12/2020   IR RADIOLOGIST EVAL & MGMT  11/18/2020   IR RADIOLOGIST EVAL & MGMT  01/01/2021   IR RADIOLOGIST EVAL & MGMT  04/15/2021   IR RADIOLOGIST EVAL & MGMT  03/07/2022   IR RADIOLOGIST EVAL & MGMT  04/21/2022   IR TRANSCATH/EMBOLIZ  09/18/2020   IR TRANSCATH/EMBOLIZ  12/16/2020   IR TRANSCATH/EMBOLIZ  04/06/2022   IR US GUIDE VASC ACCESS RIGHT  09/18/2020   IR US GUIDE VASC ACCESS RIGHT  09/17/2020   RADIOLOGY WITH ANESTHESIA N/A 09/18/2020   Procedure: IR WITH ANESTHESIA ANEURYSM EMBOLIZATION;  Surgeon: Julieanne Cotton, MD;  Location: [facility] OR;  Service: Radiology;  Laterality: N/A;   RADIOLOGY WITH ANESTHESIA N/A 12/16/2020   Procedure: RADIOLOGY WITH ANESTHESIA EMBOLIZATION;  Surgeon: Julieanne Cotton, MD;  Location: [facility] OR;  Service: Radiology;  Laterality: N/A;   RADIOLOGY WITH ANESTHESIA N/A 04/06/2022   Procedure: Aneurysm embolization;  Surgeon: Julieanne Cotton, MD;  Location: [facility] OR;  Service: Radiology;  Laterality: N/A;    Allergies: Patient has no known allergies.  Medications: Prior to Admission medications  Medication Sig Start Date End Date Taking? Authorizing Provider  aspirin 81 MG chewable tablet Chew 1 tablet (81 mg total) by mouth daily. 09/23/20   Louk, Waylan Boga, PA-C  dorzolamide-timolol (COSOPT) 22.3-6.8 MG/ML ophthalmic solution Place 1 drop into the left eye 2 (two) times daily. 07/22/21   [provider]  hydrochlorothiazide (HYDRODIURIL) 25 MG tablet Take 1 tablet (25 mg total) by mouth 2 (two) times daily. 09/22/20   Louk, Waylan Boga,  PA-C  Multiple Vitamin (MULTIVITAMIN WITH MINERALS) TABS tablet Take 1 tablet by mouth daily.    [provider]  olmesartan (BENICAR) 40 MG tablet Take 40 mg by mouth daily. 08/04/21   [provider]  rosuvastatin (CRESTOR) 5 MG tablet Take 5 mg by mouth every evening.    [provider]  ticagrelor (BRILINTA) 60 MG TABS tablet Take 1 tablet (60 mg total) by mouth 2 (two) times daily. 11/03/22   Han, Aimee H, PA-C     Family History  Problem Relation Age of Onset   Arthritis Mother    Alzheimer's disease Father    Hypertension Father    Gout Father    Hyperlipidemia Brother    Polycystic ovary syndrome Daughter     Social History   Socioeconomic History   Marital status: Single    Spouse name: Not on file   Number of children: 1   Years of education: Not on file   Highest education level: Not on file  Occupational History   Occupation: IT trainer  Tobacco Use   Smoking status: Never   Smokeless tobacco: Never  Vaping Use   Vaping status: Never Used  Substance and Sexual Activity   Alcohol use: Never   Drug use: Never   Sexual activity: Not on file  Other Topics Concern   Not on file  Social History Narrative   Not on file   Social Determinants of Health   Financial Resource Strain: Not on file  Food Insecurity: Not on file  Transportation Needs: Not on file  Physical Activity: Not on file  Stress: Not on file  Social Connections: Not on file    Review of Systems: A 12 point ROS discussed and pertinent positives are indicated in the HPI above.  All other systems are negative.  Review of Systems  Constitutional:  Negative for activity change, fatigue and fever.  HENT:  Negative for tinnitus and trouble swallowing.   Eyes:  Positive for pain and redness.  Respiratory:  Negative for cough and shortness of breath.   Cardiovascular:  Negative for chest pain.  Gastrointestinal:  Positive for nausea. Negative for abdominal pain.  Neurological:   Positive for dizziness and headaches. Negative for tremors, seizures, syncope, facial asymmetry, speech difficulty, weakness, light-headedness and numbness.  Psychiatric/Behavioral:  Negative for behavioral problems and confusion.     Vital Signs: There were no vitals taken for this visit.    Physical Exam Vitals reviewed.  HENT:     Mouth/Throat:     Mouth: Mucous membranes are moist.  Eyes:     Extraocular Movements: Extraocular movements intact.     Comments: Left eye bulging Sclera blood shot  Cardiovascular:     Rate and Rhythm: Normal rate and regular rhythm.     Heart sounds: Normal heart sounds. No murmur heard. Pulmonary:     Effort: Pulmonary effort is normal.     Breath sounds: Normal breath sounds. No wheezing.  Abdominal:     Palpations: Abdomen is soft.  Tenderness: There is no abdominal tenderness.  Musculoskeletal:        General: Normal range of motion.     Right lower leg: No edema.     Left lower leg: No edema.  Skin:    General: Skin is warm.  Neurological:     Mental Status: She is alert and oriented to person, place, and time.  Psychiatric:        Mood and Affect: Mood normal.        Behavior: Behavior normal.        Thought Content: Thought content normal.        Judgment: Judgment normal.     Imaging: MR BRAIN W WO CONTRAST  Result Date: 11/02/2022 CLINICAL DATA:  Aneurysm follow-up. EXAM: MRI HEAD WITHOUT AND WITH CONTRAST MRA HEAD WITHOUT CONTRAST TECHNIQUE: Multiplanar, multi-echo pulse sequences of the brain and surrounding structures were acquired without and with intravenous contrast. Angiographic images of the Circle of Willis were acquired using MRA technique without intravenous contrast. CONTRAST:  9mL GADAVIST GADOBUTROL 1 MMOL/ML IV SOLN COMPARISON:  MRI of the head and MRA circle-of-Willis 08/09/2022 FINDINGS: MRI HEAD FINDINGS Brain: No acute infarct, hemorrhage, or mass lesion is present. Periventricular and scattered subcortical  T2 hyperintensities bilaterally are mildly advanced for age, stable. The ventricles are of normal size. No significant extraaxial fluid collection is present. Deep brain nuclei are within normal limits. The brainstem and cerebellum are within normal limits. The internal auditory canals are within normal limits. Midline structures are within normal limits. Postcontrast images demonstrate asymmetric enlargement of the left cavernous sinus compatible with known cc fistula. Vascular: Flow is present in the major intracranial arteries. Asymmetric enlargement of the left superior ophthalmic vein skin noted. Skull and upper cervical spine: The craniocervical junction is normal. Upper cervical spine is within normal limits. Marrow signal is unremarkable. Sinuses/Orbits: The paranasal sinuses and mastoid air cells are clear. Asymmetric left-sided proptosis is present. Edematous changes are present within the orbit. Lens replacements are present bilaterally. The globes are unremarkable. MRA HEAD FINDINGS Anterior circulation: The right internal carotid artery is within normal limits from the high cervical segments through the ICA terminus. Flow gap is again noted in the left cavernous internal carotid artery corresponding to the covered stent. The left superior ophthalmic vein demonstrates flow signal. The left ICA terminus is normal. The left A1 segment is dominant. The anterior communicating artery is patent. The MCA bifurcations are within normal limits bilaterally. The ACA and MCA branch vessels are normal bilaterally. Posterior circulation: The PICA origins are codominant. The right PICA origin is visualized and normal. The vertebrobasilar junction basilar artery normal. Both posterior cerebral arteries originate from basilar tip. The superior cerebellar arteries are patent bilaterally. The PCA branch vessels are within normal limits bilaterally. Anatomic variants: None IMPRESSION: 1. Stable appearance of asymmetric  enlargement and enhancement of the left cavernous sinus compatible with known CC fistula. This raises concern for residual fistula. 2. Asymmetric enlargement of the left superior ophthalmic vein. 3. Asymmetric left-sided proptosis. 4. Stable atrophy and white matter disease. This likely reflects the sequela of chronic microvascular ischemia. 5. No acute intracranial abnormality or significant interval change. 6. Flow gap in the left cavernous internal carotid artery corresponding to the covered stent. 7. No other significant proximal stenosis, aneurysm, or branch vessel occlusion within the Circle of Willis. Electronically Signed   By: Marin Roberts M.D.   On: 11/02/2022 17:36   MR ANGIO HEAD WO CONTRAST  Result Date: 11/02/2022  CLINICAL DATA:  Aneurysm follow-up. EXAM: MRI HEAD WITHOUT AND WITH CONTRAST MRA HEAD WITHOUT CONTRAST TECHNIQUE: Multiplanar, multi-echo pulse sequences of the brain and surrounding structures were acquired without and with intravenous contrast. Angiographic images of the Circle of Willis were acquired using MRA technique without intravenous contrast. CONTRAST:  9mL GADAVIST GADOBUTROL 1 MMOL/ML IV SOLN COMPARISON:  MRI of the head and MRA circle-of-Willis 08/09/2022 FINDINGS: MRI HEAD FINDINGS Brain: No acute infarct, hemorrhage, or mass lesion is present. Periventricular and scattered subcortical T2 hyperintensities bilaterally are mildly advanced for age, stable. The ventricles are of normal size. No significant extraaxial fluid collection is present. Deep brain nuclei are within normal limits. The brainstem and cerebellum are within normal limits. The internal auditory canals are within normal limits. Midline structures are within normal limits. Postcontrast images demonstrate asymmetric enlargement of the left cavernous sinus compatible with known cc fistula. Vascular: Flow is present in the major intracranial arteries. Asymmetric enlargement of the left superior ophthalmic  vein skin noted. Skull and upper cervical spine: The craniocervical junction is normal. Upper cervical spine is within normal limits. Marrow signal is unremarkable. Sinuses/Orbits: The paranasal sinuses and mastoid air cells are clear. Asymmetric left-sided proptosis is present. Edematous changes are present within the orbit. Lens replacements are present bilaterally. The globes are unremarkable. MRA HEAD FINDINGS Anterior circulation: The right internal carotid artery is within normal limits from the high cervical segments through the ICA terminus. Flow gap is again noted in the left cavernous internal carotid artery corresponding to the covered stent. The left superior ophthalmic vein demonstrates flow signal. The left ICA terminus is normal. The left A1 segment is dominant. The anterior communicating artery is patent. The MCA bifurcations are within normal limits bilaterally. The ACA and MCA branch vessels are normal bilaterally. Posterior circulation: The PICA origins are codominant. The right PICA origin is visualized and normal. The vertebrobasilar junction basilar artery normal. Both posterior cerebral arteries originate from basilar tip. The superior cerebellar arteries are patent bilaterally. The PCA branch vessels are within normal limits bilaterally. Anatomic variants: None IMPRESSION: 1. 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 This raises concern for residual fistula. 2. Asymmetric enlargement of the left superior ophthalmic vein. 3. Asymmetric left-sided proptosis. 4. Stable atrophy and white matter disease. This likely reflects the sequela of chronic microvascular ischemia. 5. No acute intracranial abnormality or significant interval change. 6. Flow gap in the left cavernous internal carotid artery corresponding to the covered stent. 7. No other significant proximal stenosis, aneurysm, or branch vessel occlusion within  the Circle of Willis. Electronically Signed   By: Marin Roberts M.D.   On: 11/02/2022 17:36    Labs:  CBC: Recent Labs    04/06/22 0708 04/07/22 0315 11/09/22 0727  WBC 6.9 12.1* 6.5  HGB 12.6 11.5* 14.2  HCT 39.1 34.6* 44.4  PLT 224 215 231    COAGS: Recent Labs    04/06/22 0708 11/09/22 0727  INR 1.0 1.0    BMP: Recent Labs    04/06/22 0708 04/06/22 1022 04/07/22 0315  NA 136  --  139  K 3.3*  --  3.4*  CL 103  --  107  CO2 25  --  24  GLUCOSE 103*  --  128*  BUN 25*  --  16  CALCIUM 9.1  --  8.4*  CREATININE 1.08* 1.00 0.91  GFRNONAA 58*  --  >60    LIVER FUNCTION TESTS:  No results for input(s): "BILITOT", "AST", "ALT", "ALKPHOS", "PROT", "ALBUMIN" in the last 8760 hours.  TUMOR MARKERS: No results for input(s): "AFPTM", "CEA", "CA199", "CHROMGRNA" in the last 8760 hours.  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  This interventional procedure involves the use of X-rays and because of the nature of the planned procedure, it is possible that we will have prolonged use of X-ray fluoroscopy.  Potential radiation risks to you include (but are not limited to) the following: - A slightly elevated risk for cancer  several years later in life. This risk is typically less than 0.5% percent. This risk is low in comparison to the normal incidence of human cancer, which is 33% for women and 50% for men according to the [employer]. - Radiation induced injury can include skin redness, resembling a rash, tissue breakdown / ulcers and hair loss (which can be temporary or permanent).   The likelihood of either of these  occurring depends on the difficulty of the procedure and whether you are sensitive to radiation due to previous procedures, disease, or genetic conditions.   IF your procedure requires a prolonged use of radiation, you will be notified and given written instructions for further action.  It is your responsibility to monitor the irradiated area for the 2 weeks following the procedure and to notify your physician if you are concerned that you have suffered a radiation induced injury.    All of the patient's questions were answered, patient is agreeable to proceed.  Consent signed and in chart.  She is aware if intervention is performed- she will be admitted to Neuro ICU overnight for observation.  With discharge in am  Thank you for this interesting consult.  I greatly enjoyed meeting CARIL LINGE and look forward to participating in their care.  A copy of this report was sent to the requesting provider on this date.  Electronically Signed: Robet Leu, PA-C 11/09/2022, 8:22 AM   I spent a total of    25 Minutes in face to face in clinical consultation, greater than 50% of which was counseling/coordinating care for L ICA Cavernous fistula intervention

## 2022-11-11 ENCOUNTER — Ambulatory Visit (HOSPITAL_COMMUNITY)
Admission: RE | Admit: 2022-11-11 | Discharge: 2022-11-11 | Disposition: A | Discharge status: Home / Self Care | Payer: BC Managed Care – PPO | Source: Ambulatory Visit | Attending: Interventional Radiology | Admitting: Interventional Radiology

## 2022-11-11 DIAGNOSIS — I671 Cerebral aneurysm, nonruptured: Secondary | ICD-10-CM

## 2022-11-16 ENCOUNTER — Encounter (HOSPITAL_COMMUNITY): Payer: Self-pay | Admitting: Interventional Radiology

## 2022-11-16 ENCOUNTER — Other Ambulatory Visit: Payer: Self-pay | Admitting: Radiology

## 2022-11-16 DIAGNOSIS — I729 Aneurysm of unspecified site: Secondary | ICD-10-CM

## 2022-11-16 NOTE — H&P (Signed)
Chief Complaint: Patient was seen in consultation today for No chief complaint on file.   Referring Physician(s): Deveshwar,Sanjeev  Supervising Physician: {Supervising Physician:[id number]}  Patient Status: {IR Consult Patient Status:21879}  History of Present Illness: Rose Marquez is a 62 y.o. 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 She is s/p diagnostic cerebral arteriogram 09/17/20. 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 She was discharged home on 90 mg Brilinta BID and 81 mg Aspirin daily.    Rose Marquez followed up with Dr. Corliss Skains as an outpatient 10/09/20. She continued to complain of blurred/double vision in the left eye. An MRI/MRA 11/04/20 revealed a prominent superior ophthalmic vein on the left side with interval remodeling of the giant aneurysm and continued flow within the aneurysm. During consultation with Dr. Corliss Skains 11/13/20 she was presented with the option to re-treat the aneurysm to prevent potential vision loss in the left eye due to venous hypertension and to also expedite further thrombosis within the aneurysm. She was agreeable to this procedure and presented to the [facility] Neuro Interventional Radiology department 12/16/20 and underwent a diagnostic cerebral arteriogram with placements of two surpass flow diverters across ruptured site of giant left ICA cavernous aneurysm. Procedure was performed under general anesthesia with uneventful extubation  post-procedure. She was admitted to the Neuro ICU for overnight observation and was discharged home the next day.   She has been followed with routine imaging since that time. She met with Dr. Corliss Skains 03/04/22 to discuss worsening headaches, chemosis and diplopia. Her most recent MRI of the  brain 02/15/22 showed mild opacification of the cavernous sinus on the left. There was obvious proptosis with prominence of the superior ophthalmic vein, with edema of the left medial rectus muscle, and to a lesser degree the left lateral rectus. These findings along with the patient's reported symptoms are concerning for possible residual fistulous  communication between the giant aneurysm and the left cavernous sinus. Dr. Corliss Skains recommended a repeat diagnostic arteriogram under general anesthesia with intent to retreat this area and this was performed 04/06/22. She tolerated the procedure well and was discharged home the next day.   She was followed with routine imaging and an MRI/MRA brain 11/01/22 unfortunately showed findings concerning for a residual fistula. She presented once again to the [facility] 11/09/22 for possible further intervention of the left ICA cavernous fistula. This procedure was ultimately cancelled due to *** and she was rescheduled for today.   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   Gallstones    Glaucoma    Headache    HLD (hyperlipidemia)    Hypertension     Past Surgical History:  Procedure Laterality Date   CHOLECYSTECTOMY     IR 3D INDEPENDENT WKST  09/18/2020   IR ANGIO INTRA EXTRACRAN SEL COM CAROTID INNOMINATE BILAT MOD SED  09/18/2020   IR ANGIO INTRA EXTRACRAN SEL COM CAROTID INNOMINATE BILAT MOD SED  09/17/2020   IR ANGIO INTRA EXTRACRAN SEL INTERNAL CAROTID BILAT MOD SED  04/06/2022   IR ANGIO VERTEBRAL SEL VERTEBRAL UNI R MOD SED  09/18/2020   IR ANGIO VERTEBRAL SEL VERTEBRAL UNI R MOD SED  09/17/2020   IR ANGIOGRAM FOLLOW UP STUDY  04/06/2022   IR CT HEAD LTD  09/18/2020  IR CT HEAD LTD  12/16/2020   IR CT HEAD LTD  04/06/2022   IR NEURO EACH ADD'L AFTER BASIC UNI LEFT (MS)  04/06/2022   IR RADIOLOGIST EVAL & MGMT  10/12/2020   IR RADIOLOGIST EVAL & MGMT  11/18/2020   IR RADIOLOGIST EVAL & MGMT  01/01/2021   IR  RADIOLOGIST EVAL & MGMT  04/15/2021   IR RADIOLOGIST EVAL & MGMT  03/07/2022   IR RADIOLOGIST EVAL & MGMT  04/21/2022   IR TRANSCATH/EMBOLIZ  09/18/2020   IR TRANSCATH/EMBOLIZ  12/16/2020   IR TRANSCATH/EMBOLIZ  04/06/2022   IR US GUIDE VASC ACCESS RIGHT  09/18/2020   IR US GUIDE VASC ACCESS RIGHT  09/17/2020   RADIOLOGY WITH ANESTHESIA N/A 09/18/2020   Procedure: IR WITH ANESTHESIA ANEURYSM EMBOLIZATION;  Surgeon: Julieanne Cotton, MD;  Location: [facility] OR;  Service: Radiology;  Laterality: N/A;   RADIOLOGY WITH ANESTHESIA N/A 12/16/2020   Procedure: RADIOLOGY WITH ANESTHESIA EMBOLIZATION;  Surgeon: Julieanne Cotton, MD;  Location: [facility] OR;  Service: Radiology;  Laterality: N/A;   RADIOLOGY WITH ANESTHESIA N/A 04/06/2022   Procedure: Aneurysm embolization;  Surgeon: Julieanne Cotton, MD;  Location: [facility] OR;  Service: Radiology;  Laterality: N/A;    Allergies: Patient has no known allergies.  Medications: Prior to Admission medications   Medication Sig Start Date End Date Taking? Authorizing Provider  aspirin 81 MG chewable tablet Chew 1 tablet (81 mg total) by mouth daily. 09/23/20   Louk, Waylan Boga, PA-C  dorzolamide-timolol (COSOPT) 22.3-6.8 MG/ML ophthalmic solution Place 1 drop into the left eye 2 (two) times daily. 07/22/21   [provider]  hydrochlorothiazide (HYDRODIURIL) 25 MG tablet Take 1 tablet (25 mg total) by mouth 2 (two) times daily. 09/22/20   Louk, Waylan Boga, PA-C  Multiple Vitamin (MULTIVITAMIN WITH MINERALS) TABS tablet Take 1 tablet by mouth daily.    [provider]  olmesartan (BENICAR) 40 MG tablet Take 40 mg by mouth daily. 08/04/21   [provider]  rosuvastatin (CRESTOR) 5 MG tablet Take 5 mg by mouth every evening.    [provider]  ticagrelor (BRILINTA) 60 MG TABS tablet Take 1 tablet (60 mg total) by mouth 2 (two) times daily. 11/03/22   Han, Aimee H, PA-C     Family History  Problem Relation Age of Onset   Arthritis Mother     Alzheimer's disease Father    Hypertension Father    Gout Father    Hyperlipidemia Brother    Polycystic ovary syndrome Daughter     Social History   Socioeconomic History   Marital status: Single    Spouse name: Not on file   Number of children: 1   Years of education: Not on file   Highest education level: Not on file  Occupational History   Occupation: IT trainer  Tobacco Use   Smoking status: Never   Smokeless tobacco: Never  Vaping Use   Vaping status: Never Used  Substance and Sexual Activity   Alcohol use: Never   Drug use: Never   Sexual activity: Not on file  Other Topics Concern   Not on file  Social History Narrative   Not on file   Social Determinants of Health   Financial Resource Strain: Not on file  Food Insecurity: Not on file  Transportation Needs: Not on file  Physical Activity: Not on file  Stress: Not on file  Social Connections: Not on file    Review of Systems: A 12 point ROS discussed and  pertinent positives are indicated in the HPI above.  All other systems are negative.  Review of Systems  Vital Signs: There were no vitals taken for this visit.  Physical Exam  Imaging: MR BRAIN W WO CONTRAST  Result Date: 11/02/2022 CLINICAL DATA:  Aneurysm follow-up. EXAM: MRI HEAD WITHOUT AND WITH CONTRAST MRA HEAD WITHOUT CONTRAST TECHNIQUE: Multiplanar, multi-echo pulse sequences of the brain and surrounding structures were acquired without and with intravenous contrast. Angiographic images of the Circle of Willis were acquired using MRA technique without intravenous contrast. CONTRAST:  9mL GADAVIST GADOBUTROL 1 MMOL/ML IV SOLN COMPARISON:  MRI of the head and MRA circle-of-Willis 08/09/2022 FINDINGS: MRI HEAD FINDINGS Brain: No acute infarct, hemorrhage, or mass lesion is present. Periventricular and scattered subcortical T2 hyperintensities bilaterally are mildly advanced for age, stable. The ventricles are of normal size. No significant extraaxial  fluid collection is present. Deep brain nuclei are within normal limits. The brainstem and cerebellum are within normal limits. The internal auditory canals are within normal limits. Midline structures are within normal limits. Postcontrast images demonstrate asymmetric enlargement of the left cavernous sinus compatible with known cc fistula. Vascular: Flow is present in the major intracranial arteries. Asymmetric enlargement of the left superior ophthalmic vein skin noted. Skull and upper cervical spine: The craniocervical junction is normal. Upper cervical spine is within normal limits. Marrow signal is unremarkable. Sinuses/Orbits: The paranasal sinuses and mastoid air cells are clear. Asymmetric left-sided proptosis is present. Edematous changes are present within the orbit. Lens replacements are present bilaterally. The globes are unremarkable. MRA HEAD FINDINGS Anterior circulation: The right internal carotid artery is within normal limits from the high cervical segments through the ICA terminus. Flow gap is again noted in the left cavernous internal carotid artery corresponding to the covered stent. The left superior ophthalmic vein demonstrates flow signal. The left ICA terminus is normal. The left A1 segment is dominant. The anterior communicating artery is patent. The MCA bifurcations are within normal limits bilaterally. The ACA and MCA branch vessels are normal bilaterally. Posterior circulation: The PICA origins are codominant. The right PICA origin is visualized and normal. The vertebrobasilar junction basilar artery normal. Both posterior cerebral arteries originate from basilar tip. The superior cerebellar arteries are patent bilaterally. The PCA branch vessels are within normal limits bilaterally. Anatomic variants: None IMPRESSION: 1. 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 This raises concern for residual fistula. 2.  Asymmetric enlargement of the left superior ophthalmic vein. 3. Asymmetric left-sided proptosis. 4. Stable atrophy and white matter disease. This likely reflects the sequela of chronic microvascular ischemia. 5. No acute intracranial abnormality or significant interval change. 6. Flow gap in the left cavernous internal carotid artery corresponding to the covered stent. 7. No other significant proximal stenosis, aneurysm, or branch vessel occlusion within the Circle of Willis. Electronically Signed   By: Marin Roberts M.D.   On: 11/02/2022 17:36   MR ANGIO HEAD WO CONTRAST  Result Date: 11/02/2022 CLINICAL DATA:  Aneurysm follow-up. EXAM: MRI HEAD WITHOUT AND WITH CONTRAST MRA HEAD WITHOUT CONTRAST TECHNIQUE: Multiplanar, multi-echo pulse sequences of the brain and surrounding structures were acquired without and with intravenous contrast. Angiographic images of the Circle of Willis were acquired using MRA technique without intravenous contrast. CONTRAST:  9mL GADAVIST GADOBUTROL 1 MMOL/ML IV SOLN COMPARISON:  MRI of the head and MRA circle-of-Willis 08/09/2022 FINDINGS: MRI HEAD FINDINGS Brain: No acute infarct, hemorrhage, or mass lesion is present.  Periventricular and scattered subcortical T2 hyperintensities bilaterally are mildly advanced for age, stable. The ventricles are of normal size. No significant extraaxial fluid collection is present. Deep brain nuclei are within normal limits. The brainstem and cerebellum are within normal limits. The internal auditory canals are within normal limits. Midline structures are within normal limits. Postcontrast images demonstrate asymmetric enlargement of the left cavernous sinus compatible with known cc fistula. Vascular: Flow is present in the major intracranial arteries. Asymmetric enlargement of the left superior ophthalmic vein skin noted. Skull and upper cervical spine: The craniocervical junction is normal. Upper cervical spine is within normal limits.  Marrow signal is unremarkable. Sinuses/Orbits: The paranasal sinuses and mastoid air cells are clear. Asymmetric left-sided proptosis is present. Edematous changes are present within the orbit. Lens replacements are present bilaterally. The globes are unremarkable. MRA HEAD FINDINGS Anterior circulation: The right internal carotid artery is within normal limits from the high cervical segments through the ICA terminus. Flow gap is again noted in the left cavernous internal carotid artery corresponding to the covered stent. The left superior ophthalmic vein demonstrates flow signal. The left ICA terminus is normal. The left A1 segment is dominant. The anterior communicating artery is patent. The MCA bifurcations are within normal limits bilaterally. The ACA and MCA branch vessels are normal bilaterally. Posterior circulation: The PICA origins are codominant. The right PICA origin is visualized and normal. The vertebrobasilar junction basilar artery normal. Both posterior cerebral arteries originate from basilar tip. The superior cerebellar arteries are patent bilaterally. The PCA branch vessels are within normal limits bilaterally. Anatomic variants: None IMPRESSION: 1. 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 This raises concern for residual fistula. 2. Asymmetric enlargement of the left superior ophthalmic vein. 3. Asymmetric left-sided proptosis. 4. Stable atrophy and white matter disease. This likely reflects the sequela of chronic microvascular ischemia. 5. No acute intracranial abnormality or significant interval change. 6. Flow gap in the left cavernous internal carotid artery corresponding to the covered stent. 7. No other significant proximal stenosis, aneurysm, or branch vessel occlusion within the Circle of Willis. Electronically Signed   By: Marin Roberts M.D.   On: 11/02/2022 17:36    Labs:  CBC: Recent Labs     04/06/22 0708 04/07/22 0315 11/09/22 0727  WBC 6.9 12.1* 6.5  HGB 12.6 11.5* 14.2  HCT 39.1 34.6* 44.4  PLT 224 215 231    COAGS: Recent Labs    04/06/22 0708 11/09/22 0727  INR 1.0 1.0    BMP: Recent Labs    04/06/22 0708 04/06/22 1022 04/07/22 0315 11/09/22 0815  NA 136  --  139 140  K 3.3*  --  3.4* 3.3*  CL 103  --  107 102  CO2 25  --  24 26  GLUCOSE 103*  --  128* 102*  BUN 25*  --  16 19  CALCIUM 9.1  --  8.4* 9.4  CREATININE 1.08* 1.00 0.91 1.12*  GFRNONAA 58*  --  >60 55*    LIVER FUNCTION TESTS: No results for input(s): "BILITOT", "AST", "ALT", "ALKPHOS", "PROT", "ALBUMIN" in the last 8760 hours.  TUMOR MARKERS: No results for input(s): "AFPTM", "CEA", "CA199", "CHROMGRNA" in the last 8760 hours.  Assessment and Plan:  Left ICA cavernous fistula: Rose Marquez, 62 year old female, presents today to the [facility] Neuro Interventional Radiology department for an image-guided diagnostic cerebral angiogram with possible further intervention of the left ICA cavernous fistula.  Risks and benefits of this procedure were discussed with the patient including, but not limited to bleeding, infection, vascular injury or contrast induced renal failure. The risk of stroke was discussed.   This interventional procedure involves the use of X-rays and because of the nature of the planned procedure, it is possible that we will have prolonged use of X-ray fluoroscopy.  Potential radiation risks to you include (but are not limited to) the following: - A slightly elevated risk for cancer  several years later in life. This risk is typically less than 0.5% percent. This risk is low in comparison to the normal incidence of human cancer, which is 33% for women and 50% for men according to the [employer]. - Radiation induced injury can include skin redness, resembling a rash, tissue breakdown / ulcers and hair loss (which can be temporary or permanent).    The likelihood of either of these occurring depends on the difficulty of the procedure and whether you are sensitive to radiation due to previous procedures, disease, or genetic conditions.   IF your procedure requires a prolonged use of radiation, you will be notified and given written instructions for further action.  It is your responsibility to monitor the irradiated area for the 2 weeks following the procedure and to notify your physician if you are concerned that you have suffered a radiation induced injury.    All of the patient's questions were answered, patient is agreeable to proceed. She has been NPO. She is a full code. Her last dose of Aspirin and Brilinta was***  Consent signed and in chart.  Thank you for this interesting consult.  I greatly enjoyed meeting Rose Marquez and look forward to participating in their care.  A copy of this report was sent to the requesting provider on this date.  Electronically Signed: Alwyn Ren, AGACNP-BC [phone] 11/16/2022, 10:10 AM   I spent a total of  30 Minutes   in face to face in clinical consultation, greater than 50% of which was counseling/coordinating care for diagnostic cerebral angiogram with possible intervention.

## 2022-11-16 NOTE — H&P (Signed)
Chief Complaint: Patient was seen in consultation today for left ICA cavernous fistula   Referring Physician(s): Deveshwar,Sanjeev  Supervising Physician: Julieanne Cotton  Patient Status: [facility] - Out-pt  History of Present Illness: MIKI KNIESS is a 62 y.o. 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 She is s/p diagnostic cerebral arteriogram 09/17/20. 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 She was discharged home on 90 mg Brilinta BID and 81 mg Aspirin daily.    Ms. Bloch followed up with Dr. Corliss Skains as an outpatient 10/09/20. She continued to complain of blurred/double vision in the left eye. An MRI/MRA 11/04/20 revealed a prominent superior ophthalmic vein on the left side with interval remodeling of the giant aneurysm and continued flow within the aneurysm. During consultation with Dr. Corliss Skains 11/13/20 she was presented with the option to re-treat the aneurysm to prevent potential vision loss in the left eye due to venous hypertension and to also expedite further thrombosis within the aneurysm. She was agreeable to this procedure and presented to the [facility] Neuro Interventional Radiology department 12/16/20 and underwent a diagnostic cerebral arteriogram with placements of two surpass flow diverters across ruptured site of giant left ICA cavernous aneurysm. Procedure was performed under general anesthesia with uneventful extubation  post-procedure. She was admitted to the Neuro ICU for overnight observation and was discharged home the next day.   She has been followed with routine imaging since that time. She met with Dr. Corliss Skains 03/04/22 to discuss worsening headaches, chemosis and diplopia. Her most recent MRI of the brain 02/15/22 showed mild  opacification of the cavernous sinus on the left. There was obvious proptosis with prominence of the superior ophthalmic vein, with edema of the left medial rectus muscle, and to a lesser degree the left lateral rectus. These findings along with the patient's reported symptoms are concerning for possible residual fistulous  communication between the giant aneurysm and the left cavernous sinus. Dr. Corliss Skains recommended a repeat diagnostic arteriogram under general anesthesia with intent to retreat this area and this was performed 04/06/22. She tolerated the procedure well and was discharged home the next day.   She was followed with routine imaging and an MRI/MRA brain 11/01/22 unfortunately showed findings concerning for a residual fistula. She presented once again to the [facility] 11/09/22 for possible further intervention of the left ICA cavernous fistula. The device Dr. Corliss Skains wanted to use was not approved for the procedure and the procedure was rescheduled for today.    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   Gallstones    Glaucoma    Headache    HLD (hyperlipidemia)    Hypertension     Past Surgical History:  Procedure Laterality Date   CHOLECYSTECTOMY     IR 3D INDEPENDENT WKST  09/18/2020   IR ANGIO INTRA EXTRACRAN SEL COM CAROTID INNOMINATE BILAT MOD SED  09/18/2020   IR ANGIO INTRA EXTRACRAN SEL COM CAROTID INNOMINATE BILAT MOD SED  09/17/2020   IR ANGIO INTRA EXTRACRAN SEL INTERNAL CAROTID BILAT MOD SED  04/06/2022   IR ANGIO VERTEBRAL SEL VERTEBRAL UNI R MOD SED  09/18/2020   IR ANGIO VERTEBRAL SEL VERTEBRAL UNI R MOD SED  09/17/2020   IR ANGIOGRAM FOLLOW UP STUDY  04/06/2022   IR CT HEAD  LTD  09/18/2020   IR CT HEAD LTD  12/16/2020   IR CT HEAD LTD  04/06/2022   IR NEURO EACH ADD'L AFTER BASIC UNI LEFT (MS)  04/06/2022   IR RADIOLOGIST EVAL & MGMT  10/12/2020   IR RADIOLOGIST EVAL & MGMT  11/18/2020   IR RADIOLOGIST EVAL & MGMT  01/01/2021   IR  RADIOLOGIST EVAL & MGMT  04/15/2021   IR RADIOLOGIST EVAL & MGMT  03/07/2022   IR RADIOLOGIST EVAL & MGMT  04/21/2022   IR TRANSCATH/EMBOLIZ  09/18/2020   IR TRANSCATH/EMBOLIZ  12/16/2020   IR TRANSCATH/EMBOLIZ  04/06/2022   IR US GUIDE VASC ACCESS RIGHT  09/18/2020   IR US GUIDE VASC ACCESS RIGHT  09/17/2020   RADIOLOGY WITH ANESTHESIA N/A 09/18/2020   Procedure: IR WITH ANESTHESIA ANEURYSM EMBOLIZATION;  Surgeon: Julieanne Cotton, MD;  Location: [facility] OR;  Service: Radiology;  Laterality: N/A;   RADIOLOGY WITH ANESTHESIA N/A 12/16/2020   Procedure: RADIOLOGY WITH ANESTHESIA EMBOLIZATION;  Surgeon: Julieanne Cotton, MD;  Location: [facility] OR;  Service: Radiology;  Laterality: N/A;   RADIOLOGY WITH ANESTHESIA N/A 04/06/2022   Procedure: Aneurysm embolization;  Surgeon: Julieanne Cotton, MD;  Location: [facility] OR;  Service: Radiology;  Laterality: N/A;    Allergies: Patient has no allergy information on record.  Medications: Prior to Admission medications   Medication Sig Start Date End Date Taking? Authorizing Provider  aspirin 81 MG chewable tablet Chew 1 tablet (81 mg total) by mouth daily. 09/23/20   Louk, Waylan Boga, PA-C  dorzolamide-timolol (COSOPT) 22.3-6.8 MG/ML ophthalmic solution Place 1 drop into the left eye 2 (two) times daily. 07/22/21   [provider]  hydrochlorothiazide (HYDRODIURIL) 25 MG tablet Take 1 tablet (25 mg total) by mouth 2 (two) times daily. 09/22/20   Louk, Waylan Boga, PA-C  Multiple Vitamin (MULTIVITAMIN WITH MINERALS) TABS tablet Take 1 tablet by mouth daily.    [provider]  olmesartan (BENICAR) 40 MG tablet Take 40 mg by mouth daily. 08/04/21   [provider]  rosuvastatin (CRESTOR) 5 MG tablet Take 5 mg by mouth every evening.    [provider]  ticagrelor (BRILINTA) 60 MG TABS tablet Take 1 tablet (60 mg total) by mouth 2 (two) times daily. 11/03/22   Han, Aimee H, PA-C     Family History  Problem Relation Age of Onset    Arthritis Mother    Alzheimer's disease Father    Hypertension Father    Gout Father    Hyperlipidemia Brother    Polycystic ovary syndrome Daughter     Social History   Socioeconomic History   Marital status: Single    Spouse name: Not on file   Number of children: 1   Years of education: Not on file   Highest education level: Not on file  Occupational History   Occupation: IT trainer  Tobacco Use   Smoking status: Never   Smokeless tobacco: Never  Vaping Use   Vaping status: Never Used  Substance and Sexual Activity   Alcohol use: Never   Drug use: Never   Sexual activity: Not on file  Other Topics Concern   Not on file  Social History Narrative   Not on file   Social Determinants of Health   Financial Resource Strain: Not on file  Food Insecurity: Not on file  Transportation Needs: Not on file  Physical Activity: Not on file  Stress: Not on file  Social Connections: Not on file    Review of  Systems: A 12 point ROS discussed and pertinent positives are indicated in the HPI above.  All other systems are negative.  Review of Systems  Constitutional:  Negative for appetite change and fatigue.  Eyes:  Positive for redness and visual disturbance.  Respiratory:  Negative for cough and shortness of breath.   Cardiovascular:  Negative for chest pain and leg swelling.  Gastrointestinal:  Positive for nausea. Negative for abdominal pain and vomiting.  Musculoskeletal:  Negative for back pain.  Neurological:  Negative for dizziness, weakness and headaches.  Psychiatric/Behavioral:  Negative for confusion.     Vital Signs: Temp: 98  BP 120/86  HR 89  RR 18  95% on room air   Physical Exam Constitutional:      General: She is not in acute distress.    Appearance: She is not ill-appearing.  HENT:     Ears:     Comments: Left eye redness and swelling.     Mouth/Throat:     Mouth: Mucous membranes are moist.     Pharynx: Oropharynx is clear.  Eyes:     General: Visual  field deficit present.  Cardiovascular:     Rate and Rhythm: Normal rate and regular rhythm.     Pulses: Normal pulses.     Heart sounds: Normal heart sounds.  Pulmonary:     Effort: Pulmonary effort is normal.     Breath sounds: Normal breath sounds.  Abdominal:     General: Bowel sounds are normal.     Palpations: Abdomen is soft.     Tenderness: There is no abdominal tenderness.  Musculoskeletal:     Right lower leg: No edema.     Left lower leg: No edema.  Skin:    General: Skin is warm and dry.  Neurological:     Mental Status: She is alert and oriented to person, place, and time.     Cranial Nerves: No dysarthria.     Comments: Partial vision loss in left eye.   Psychiatric:        Mood and Affect: Mood normal.        Behavior: Behavior normal.        Thought Content: Thought content normal.        Judgment: Judgment normal.     Imaging: MR BRAIN W WO CONTRAST  Result Date: 11/02/2022 CLINICAL DATA:  Aneurysm follow-up. EXAM: MRI HEAD WITHOUT AND WITH CONTRAST MRA HEAD WITHOUT CONTRAST TECHNIQUE: Multiplanar, multi-echo pulse sequences of the brain and surrounding structures were acquired without and with intravenous contrast. Angiographic images of the Circle of Willis were acquired using MRA technique without intravenous contrast. CONTRAST:  9mL GADAVIST GADOBUTROL 1 MMOL/ML IV SOLN COMPARISON:  MRI of the head and MRA circle-of-Willis 08/09/2022 FINDINGS: MRI HEAD FINDINGS Brain: No acute infarct, hemorrhage, or mass lesion is present. Periventricular and scattered subcortical T2 hyperintensities bilaterally are mildly advanced for age, stable. The ventricles are of normal size. No significant extraaxial fluid collection is present. Deep brain nuclei are within normal limits. The brainstem and cerebellum are within normal limits. The internal auditory canals are within normal limits. Midline structures are within normal limits. Postcontrast images demonstrate asymmetric  enlargement of the left cavernous sinus compatible with known cc fistula. Vascular: Flow is present in the major intracranial arteries. Asymmetric enlargement of the left superior ophthalmic vein skin noted. Skull and upper cervical spine: The craniocervical junction is normal. Upper cervical spine is within normal limits. Marrow signal is unremarkable. Sinuses/Orbits: The paranasal  sinuses and mastoid air cells are clear. Asymmetric left-sided proptosis is present. Edematous changes are present within the orbit. Lens replacements are present bilaterally. The globes are unremarkable. MRA HEAD FINDINGS Anterior circulation: The right internal carotid artery is within normal limits from the high cervical segments through the ICA terminus. Flow gap is again noted in the left cavernous internal carotid artery corresponding to the covered stent. The left superior ophthalmic vein demonstrates flow signal. The left ICA terminus is normal. The left A1 segment is dominant. The anterior communicating artery is patent. The MCA bifurcations are within normal limits bilaterally. The ACA and MCA branch vessels are normal bilaterally. Posterior circulation: The PICA origins are codominant. The right PICA origin is visualized and normal. The vertebrobasilar junction basilar artery normal. Both posterior cerebral arteries originate from basilar tip. The superior cerebellar arteries are patent bilaterally. The PCA branch vessels are within normal limits bilaterally. Anatomic variants: None IMPRESSION: 1. 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 This raises concern for residual fistula. 2. Asymmetric enlargement of the left superior ophthalmic vein. 3. Asymmetric left-sided proptosis. 4. Stable atrophy and white matter disease. This likely reflects the sequela of chronic microvascular ischemia. 5. No acute intracranial abnormality or significant interval  change. 6. Flow gap in the left cavernous internal carotid artery corresponding to the covered stent. 7. No other significant proximal stenosis, aneurysm, or branch vessel occlusion within the Circle of Willis. Electronically Signed   By: Marin Roberts M.D.   On: 11/02/2022 17:36   MR ANGIO HEAD WO CONTRAST  Result Date: 11/02/2022 CLINICAL DATA:  Aneurysm follow-up. EXAM: MRI HEAD WITHOUT AND WITH CONTRAST MRA HEAD WITHOUT CONTRAST TECHNIQUE: Multiplanar, multi-echo pulse sequences of the brain and surrounding structures were acquired without and with intravenous contrast. Angiographic images of the Circle of Willis were acquired using MRA technique without intravenous contrast. CONTRAST:  9mL GADAVIST GADOBUTROL 1 MMOL/ML IV SOLN COMPARISON:  MRI of the head and MRA circle-of-Willis 08/09/2022 FINDINGS: MRI HEAD FINDINGS Brain: No acute infarct, hemorrhage, or mass lesion is present. Periventricular and scattered subcortical T2 hyperintensities bilaterally are mildly advanced for age, stable. The ventricles are of normal size. No significant extraaxial fluid collection is present. Deep brain nuclei are within normal limits. The brainstem and cerebellum are within normal limits. The internal auditory canals are within normal limits. Midline structures are within normal limits. Postcontrast images demonstrate asymmetric enlargement of the left cavernous sinus compatible with known cc fistula. Vascular: Flow is present in the major intracranial arteries. Asymmetric enlargement of the left superior ophthalmic vein skin noted. Skull and upper cervical spine: The craniocervical junction is normal. Upper cervical spine is within normal limits. Marrow signal is unremarkable. Sinuses/Orbits: The paranasal sinuses and mastoid air cells are clear. Asymmetric left-sided proptosis is present. Edematous changes are present within the orbit. Lens replacements are present bilaterally. The globes are unremarkable. MRA  HEAD FINDINGS Anterior circulation: The right internal carotid artery is within normal limits from the high cervical segments through the ICA terminus. Flow gap is again noted in the left cavernous internal carotid artery corresponding to the covered stent. The left superior ophthalmic vein demonstrates flow signal. The left ICA terminus is normal. The left A1 segment is dominant. The anterior communicating artery is patent. The MCA bifurcations are within normal limits bilaterally. The ACA and MCA branch vessels are normal bilaterally. Posterior circulation: The PICA origins are codominant. The right PICA origin is visualized and  normal. The vertebrobasilar junction basilar artery normal. Both posterior cerebral arteries originate from basilar tip. The superior cerebellar arteries are patent bilaterally. The PCA branch vessels are within normal limits bilaterally. Anatomic variants: None IMPRESSION: 1. 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 This raises concern for residual fistula. 2. Asymmetric enlargement of the left superior ophthalmic vein. 3. Asymmetric left-sided proptosis. 4. Stable atrophy and white matter disease. This likely reflects the sequela of chronic microvascular ischemia. 5. No acute intracranial abnormality or significant interval change. 6. Flow gap in the left cavernous internal carotid artery corresponding to the covered stent. 7. No other significant proximal stenosis, aneurysm, or branch vessel occlusion within the Circle of Willis. Electronically Signed   By: Marin Roberts M.D.   On: 11/02/2022 17:36    Labs:  CBC: Recent Labs    04/06/22 0708 04/07/22 0315 11/09/22 0727 11/18/22 0643  WBC 6.9 12.1* 6.5 7.1  HGB 12.6 11.5* 14.2 13.4  HCT 39.1 34.6* 44.4 41.3  PLT 224 215 231 206    COAGS: Recent Labs    04/06/22 0708 11/09/22 0727 11/18/22 0643  INR 1.0 1.0 1.0    BMP: Recent  Labs    04/06/22 0708 04/06/22 1022 04/07/22 0315 11/09/22 0815 11/18/22 0643  NA 136  --  139 140 138  K 3.3*  --  3.4* 3.3* 3.4*  CL 103  --  107 102 105  CO2 25  --  24 26 23   GLUCOSE 103*  --  128* 102* 100*  BUN 25*  --  16 19 21   CALCIUM 9.1  --  8.4* 9.4 8.9  CREATININE 1.08* 1.00 0.91 1.12* 0.88  GFRNONAA 58*  --  >60 55* >60    LIVER FUNCTION TESTS: No results for input(s): "BILITOT", "AST", "ALT", "ALKPHOS", "PROT", "ALBUMIN" in the last 8760 hours.  TUMOR MARKERS: No results for input(s): "AFPTM", "CEA", "CA199", "CHROMGRNA" in the last 8760 hours.  Assessment and Plan:  Left ICA cavernous fistula: Deane S. Dudas, 62 year old female, presents today to the [facility] Neuro Interventional Radiology department for an image-guided diagnostic cerebral angiogram with possible further intervention of the left ICA cavernous fistula.   Risks and benefits of this procedure were discussed with the patient including, but not limited to bleeding, infection, vascular injury or contrast induced renal failure. The risk of stroke was discussed.   This interventional procedure involves the use of X-rays and because of the nature of the planned procedure, it is possible that we will have prolonged use of X-ray fluoroscopy.  Potential radiation risks to you include (but are not limited to) the following: - A slightly elevated risk for cancer  several years later in life. This risk is typically less than 0.5% percent. This risk is low in comparison to the normal incidence of human cancer, which is 33% for women and 50% for men according to the [employer]. - Radiation induced injury can include skin redness, resembling a rash, tissue breakdown / ulcers and hair loss (which can be temporary or permanent).   The likelihood of either of these occurring depends on the difficulty of the procedure and whether you are sensitive to radiation due to previous procedures, disease, or  genetic conditions.   IF your procedure requires a prolonged use of radiation, you will be notified and given written instructions for further action.  It is your responsibility to monitor the irradiated area for the 2 weeks  following the procedure and to notify your physician if you are concerned that you have suffered a radiation induced injury.    All of the patient's questions were answered, patient is agreeable to proceed. She has been NPO. She is a full code. Her last dose of Aspirin and Brilinta was this morning. She takes 60 mg Brilinta BID and Aspirin once daily.   Consent signed and in chart.  Thank you for this interesting consult.  I greatly enjoyed meeting MAX WAREN and look forward to participating in their care.  A copy of this report was sent to the requesting provider on this date.  Electronically Signed: Alwyn Ren, AGACNP-BC [phone] 11/18/2022, 7:42 AM   I spent a total of  30 Minutes   in face to face in clinical consultation, greater than 50% of which was counseling/coordinating care for diagnostic cerebral angiogram with possible intervention.

## 2022-11-17 NOTE — Progress Notes (Signed)
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 Case was initially scheduled for 11/09/22, but was rescheduled for 11/18/22 for unknown reasons. She is on ASA 81 mg and Brilinta.   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  Anesthesia team to evaluate on the day of surgery. She did have CBC, PT/INR, and BMET on 11/09/22.   VS:  Wt Readings from Last 3 Encounters:  11/09/22 90.7 kg  04/06/22 90.7 kg  08/27/21 93 kg   Temp Readings from Last 3 Encounters:  11/09/22 37.1 C (Oral)  04/07/22 36.6 C (Oral)  12/17/20 37.1 C (Oral)   BP Readings from Last 3 Encounters:  11/09/22 (!) 119/95  04/07/22 106/74  08/27/21 90/70   Pulse Readings from Last 3 Encounters:  11/09/22 75  04/07/22 89  08/27/21 88     PROVIDERS: Diamantina Providence, FNP is PCP    LABS: For day of procedure if indicated per IR. Most recent labs results in [facility] include: Lab Results  Component Value Date   WBC 6.5 11/09/2022   HGB 14.2 11/09/2022   HCT 44.4 11/09/2022   PLT 231 11/09/2022   GLUCOSE 102 (H) 11/09/2022   TRIG 81 09/20/2020   NA 140 11/09/2022   K 3.3 (L) 11/09/2022   CL 102 11/09/2022   CREATININE 1.12 (H) 11/09/2022   BUN 19 11/09/2022   CO2 26 11/09/2022   INR 1.0 11/09/2022     IMAGES: MRI/MRA Head 11/01/22: IMPRESSION: 1. Stable appearance of asymmetric enlargement and enhancement of the left cavernous  sinus compatible with known CC fistula. This raises concern for residual fistula. 2. Asymmetric enlargement of the left superior ophthalmic vein. 3. Asymmetric left-sided proptosis. 4. Stable atrophy and white matter disease. This likely reflects the sequela of chronic microvascular ischemia. 5. No acute intracranial abnormality or significant interval change. 6. Flow gap in the left cavernous internal carotid artery corresponding to the covered stent. 7. No other significant proximal stenosis, aneurysm, or branch vessel occlusion within the Circle of Willis.   EKG: 04/06/22: Sinus rhythm with marked sinus arrhythmia Otherwise normal ECG When compared with ECG of 20-Sep-2020 16:01, no pvc present Confirmed by Jodelle Red [phone]) on 04/06/2022 9:46:39 AM   CV: Echo 09/21/20: IMPRESSIONS   1. Left ventricular ejection fraction, by estimation, is 65 to 70%. The  left ventricle has normal function. The left ventricle has no regional  wall motion abnormalities. There is moderate concentric left ventricular  hypertrophy. Left ventricular  diastolic parameters are consistent with Grade I diastolic dysfunction  (impaired relaxation).   2. Right ventricular systolic function is normal. The right ventricular  size is normal.   3. Left atrial size was mildly dilated.   4. The mitral valve is normal in structure. No evidence of mitral valve  regurgitation.   5. The aortic valve is tricuspid. Aortic valve regurgitation is not  visualized.   6. The inferior vena cava is normal in size with greater than 50%  respiratory variability, suggesting right atrial pressure of 3 mmHg.    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   Gallstones    Glaucoma    Headache    HLD (hyperlipidemia)    Hypertension     Past Surgical History:  Procedure Laterality Date   CHOLECYSTECTOMY     IR 3D INDEPENDENT WKST  09/18/2020   IR ANGIO INTRA EXTRACRAN SEL  COM CAROTID INNOMINATE BILAT MOD SED  09/18/2020   IR ANGIO INTRA EXTRACRAN SEL COM CAROTID INNOMINATE BILAT MOD SED  09/17/2020   IR ANGIO INTRA EXTRACRAN SEL INTERNAL CAROTID BILAT MOD SED  04/06/2022   IR ANGIO VERTEBRAL SEL VERTEBRAL UNI R MOD SED  09/18/2020   IR ANGIO VERTEBRAL SEL VERTEBRAL UNI R MOD SED  09/17/2020   IR ANGIOGRAM FOLLOW UP STUDY  04/06/2022   IR CT HEAD LTD  09/18/2020   IR CT HEAD LTD  12/16/2020   IR CT HEAD LTD  04/06/2022   IR NEURO EACH ADD'L AFTER BASIC UNI LEFT (MS)  04/06/2022   IR RADIOLOGIST EVAL & MGMT  10/12/2020   IR RADIOLOGIST EVAL & MGMT  11/18/2020   IR RADIOLOGIST EVAL & MGMT  01/01/2021   IR RADIOLOGIST EVAL & MGMT  04/15/2021   IR RADIOLOGIST EVAL & MGMT  03/07/2022   IR RADIOLOGIST EVAL & MGMT  04/21/2022   IR TRANSCATH/EMBOLIZ  09/18/2020   IR TRANSCATH/EMBOLIZ  12/16/2020   IR TRANSCATH/EMBOLIZ  04/06/2022   IR US GUIDE VASC ACCESS RIGHT  09/18/2020   IR US GUIDE VASC ACCESS RIGHT  09/17/2020   RADIOLOGY WITH ANESTHESIA N/A 09/18/2020   Procedure: IR WITH ANESTHESIA ANEURYSM EMBOLIZATION;  Surgeon: Julieanne Cotton, MD;  Location: [facility];  Service: Radiology;  Laterality: N/A;   RADIOLOGY WITH ANESTHESIA N/A 12/16/2020   Procedure: RADIOLOGY WITH ANESTHESIA EMBOLIZATION;  Surgeon: Julieanne Cotton, MD;  Location: [facility];  Service: Radiology;  Laterality: N/A;   RADIOLOGY WITH ANESTHESIA N/A 04/06/2022   Procedure: Aneurysm embolization;  Surgeon: Julieanne Cotton, MD;  Location: [facility];  Service: Radiology;  Laterality: N/A;    MEDICATIONS: No current facility-administered medications for this encounter.    aspirin 81 MG chewable tablet   dorzolamide-timolol (COSOPT) 22.3-6.8 MG/ML ophthalmic solution   hydrochlorothiazide (HYDRODIURIL) 25 MG tablet   Multiple Vitamin (MULTIVITAMIN WITH MINERALS) TABS tablet   olmesartan (BENICAR) 40 MG tablet   rosuvastatin (CRESTOR) 5 MG tablet   ticagrelor (BRILINTA) 60 MG TABS tablet    Shonna Chock, PA-C Surgical Short Stay/Anesthesiology [facility] Phone [phone] [facility] Phone [phone] 11/17/2022 11:00 AM

## 2022-11-17 NOTE — Progress Notes (Signed)
I spoke with patient a week and half ago for same procedure. She states the procedure could not be done on the 14th and was moved to 8/23. She states nothing has changed with her medications, allergies or her health history. Pre-op instructions given to pt and she voiced understanding.

## 2022-11-17 NOTE — Anesthesia Preprocedure Evaluation (Addendum)
Anesthesia Evaluation  Patient identified by MRN, date of birth, ID band Patient awake    Reviewed: Allergy & Precautions, NPO status , Patient's Chart, lab work & pertinent test results  History of Anesthesia Complications Negative for: history of anesthetic complications  Airway Mallampati: III  TM Distance: >3 FB Neck ROM: Full   Comment: Previous grade II view with MAC 3, difficult mask requiring 2-person ventilation Dental  (+) Dental Advisory Given   Pulmonary neg pulmonary ROS   Pulmonary exam normal breath sounds clear to auscultation       Cardiovascular hypertension (HCTZ, olmesartan), Pt. on medications (-) angina (-) Past MI, (-) Cardiac Stents and (-) CABG (-) dysrhythmias  Rhythm:Regular Rate:Normal  HLD  TTE 09/21/2020: IMPRESSIONS     1. Left ventricular ejection fraction, by estimation, is 65 to 70%. The  left ventricle has normal function. The left ventricle has no regional  wall motion abnormalities. There is moderate concentric left ventricular  hypertrophy. Left ventricular  diastolic parameters are consistent with Grade I diastolic dysfunction  (impaired relaxation).   2. Right ventricular systolic function is normal. The right ventricular  size is normal.   3. Left atrial size was mildly dilated.   4. The mitral valve is normal in structure. No evidence of mitral valve  regurgitation.   5. The aortic valve is tricuspid. Aortic valve regurgitation is not  visualized.   6. The inferior vena cava is normal in size with greater than 50%  respiratory variability, suggesting right atrial pressure of 3 mmHg.     Neuro/Psych  Headaches, neg Seizures aneurysm    GI/Hepatic negative GI ROS, Neg liver ROS,,,  Endo/Other  negative endocrine ROS    Renal/GU negative Renal ROS     Musculoskeletal  (+) Arthritis ,    Abdominal  (+) + obese  Peds  Hematology negative hematology ROS (+)    Anesthesia Other Findings 62 year old female, presents today to the [facility] Neuro Interventional Radiology department for an image-guided diagnostic cerebral angiogram with possible further intervention of the left ICA cavernous fistula.   Last Brilinta: this morning  Left eye red and puffy. Patient states this is due to her aneurysm and has been present since her last embolization.  Reproductive/Obstetrics                             Anesthesia Physical Anesthesia Plan  ASA: 3  Anesthesia Plan: General   Post-op Pain Management:    Induction: Intravenous  PONV Risk Score and Plan: 3 and Ondansetron, Dexamethasone and Treatment may vary due to age or medical condition  Airway Management Planned: Oral ETT  Additional Equipment: Arterial line  Intra-op Plan:   Post-operative Plan: Extubation in OR  Informed Consent: I have reviewed the patients History and Physical, chart, labs and discussed the procedure including the risks, benefits and alternatives for the proposed anesthesia with the patient or authorized representative who has indicated his/her understanding and acceptance.     Dental advisory given  Plan Discussed with: CRNA and Anesthesiologist  Anesthesia Plan Comments: (PAT note written 11/17/2022 by Shonna Chock, PA-C.  Risks of general anesthesia discussed including, but not limited to, sore throat, hoarse voice, chipped/damaged teeth, injury to vocal cords, nausea and vomiting, allergic reactions, lung infection, heart attack, stroke, and death. All questions answered.   )       Anesthesia Quick Evaluation

## 2022-11-18 ENCOUNTER — Other Ambulatory Visit: Payer: Self-pay

## 2022-11-18 ENCOUNTER — Ambulatory Visit (HOSPITAL_COMMUNITY): Payer: BC Managed Care – PPO | Admitting: Vascular Surgery

## 2022-11-18 ENCOUNTER — Ambulatory Visit (HOSPITAL_COMMUNITY)
Admission: RE | Admit: 2022-11-18 | Discharge: 2022-11-18 | Disposition: A | Discharge status: Home / Self Care | Payer: BC Managed Care – PPO | Source: Ambulatory Visit | Attending: Interventional Radiology | Admitting: Interventional Radiology

## 2022-11-18 ENCOUNTER — Encounter (HOSPITAL_COMMUNITY)
Admission: AD | Disposition: A | Discharge status: Home / Self Care | Payer: Self-pay | Source: Home / Self Care | Attending: Interventional Radiology

## 2022-11-18 ENCOUNTER — Encounter (HOSPITAL_COMMUNITY): Payer: Self-pay | Admitting: Interventional Radiology

## 2022-11-18 ENCOUNTER — Observation Stay (HOSPITAL_COMMUNITY)
Admission: AD | Admit: 2022-11-18 | Discharge: 2022-11-19 | Disposition: A | Discharge status: Home / Self Care | Payer: BC Managed Care – PPO | Attending: Interventional Radiology | Admitting: Interventional Radiology

## 2022-11-18 DIAGNOSIS — H409 Unspecified glaucoma: Secondary | ICD-10-CM | POA: Diagnosis not present

## 2022-11-18 DIAGNOSIS — Z83438 Family history of other disorder of lipoprotein metabolism and other lipidemia: Secondary | ICD-10-CM

## 2022-11-18 DIAGNOSIS — Z7982 Long term (current) use of aspirin: Secondary | ICD-10-CM | POA: Diagnosis not present

## 2022-11-18 DIAGNOSIS — Z7902 Long term (current) use of antithrombotics/antiplatelets: Secondary | ICD-10-CM | POA: Insufficient documentation

## 2022-11-18 DIAGNOSIS — Z8261 Family history of arthritis: Secondary | ICD-10-CM

## 2022-11-18 DIAGNOSIS — I671 Cerebral aneurysm, nonruptured: Principal | ICD-10-CM | POA: Insufficient documentation

## 2022-11-18 DIAGNOSIS — H11422 Conjunctival edema, left eye: Secondary | ICD-10-CM | POA: Diagnosis present

## 2022-11-18 DIAGNOSIS — Z79899 Other long term (current) drug therapy: Secondary | ICD-10-CM | POA: Diagnosis not present

## 2022-11-18 DIAGNOSIS — Z820 Family history of epilepsy and other diseases of the nervous system: Secondary | ICD-10-CM | POA: Diagnosis not present

## 2022-11-18 DIAGNOSIS — H0520 Unspecified exophthalmos: Secondary | ICD-10-CM | POA: Diagnosis present

## 2022-11-18 DIAGNOSIS — R262 Difficulty in walking, not elsewhere classified: Secondary | ICD-10-CM | POA: Diagnosis not present

## 2022-11-18 DIAGNOSIS — Z8249 Family history of ischemic heart disease and other diseases of the circulatory system: Secondary | ICD-10-CM

## 2022-11-18 DIAGNOSIS — I729 Aneurysm of unspecified site: Secondary | ICD-10-CM

## 2022-11-18 DIAGNOSIS — I10 Essential (primary) hypertension: Secondary | ICD-10-CM | POA: Diagnosis not present

## 2022-11-18 DIAGNOSIS — E785 Hyperlipidemia, unspecified: Secondary | ICD-10-CM | POA: Insufficient documentation

## 2022-11-18 DIAGNOSIS — H532 Diplopia: Secondary | ICD-10-CM | POA: Diagnosis present

## 2022-11-18 HISTORY — PX: IR CT HEAD LTD: IMG2386

## 2022-11-18 HISTORY — PX: RADIOLOGY WITH ANESTHESIA: SHX6223

## 2022-11-18 HISTORY — PX: IR TRANSCATH/EMBOLIZ: IMG695

## 2022-11-18 HISTORY — PX: IR 3D INDEPENDENT WKST: IMG2385

## 2022-11-18 HISTORY — PX: IR ANGIOGRAM FOLLOW UP STUDY: IMG697

## 2022-11-18 HISTORY — PX: IR ANGIO INTRA EXTRACRAN SEL COM CAROTID INNOMINATE UNI R MOD SED: IMG5359

## 2022-11-18 HISTORY — PX: IR ANGIO EXTERNAL CAROTID SEL EXT CAROTID UNI L MOD SED: IMG5370

## 2022-11-18 LAB — CBC WITH DIFFERENTIAL/PLATELET
Abs Immature Granulocytes: 0.01 10*3/uL (ref 0.00–0.07)
Basophils Absolute: 0.1 10*3/uL (ref 0.0–0.1)
Basophils Relative: 1 %
Eosinophils Absolute: 0.1 10*3/uL (ref 0.0–0.5)
Eosinophils Relative: 2 %
HCT: 41.3 % (ref 36.0–46.0)
Hemoglobin: 13.4 g/dL (ref 12.0–15.0)
Immature Granulocytes: 0 %
Lymphocytes Relative: 26 %
Lymphs Abs: 1.8 10*3/uL (ref 0.7–4.0)
MCH: 27.5 pg (ref 26.0–34.0)
MCHC: 32.4 g/dL (ref 30.0–36.0)
MCV: 84.6 fL (ref 80.0–100.0)
Monocytes Absolute: 0.5 10*3/uL (ref 0.1–1.0)
Monocytes Relative: 6 %
Neutro Abs: 4.6 10*3/uL (ref 1.7–7.7)
Neutrophils Relative %: 65 %
Platelets: 206 10*3/uL (ref 150–400)
RBC: 4.88 MIL/uL (ref 3.87–5.11)
RDW: 15.4 % (ref 11.5–15.5)
WBC: 7.1 10*3/uL (ref 4.0–10.5)
nRBC: 0 % (ref 0.0–0.2)

## 2022-11-18 LAB — POCT ACTIVATED CLOTTING TIME: Activated Clotting Time: 238 seconds

## 2022-11-18 LAB — BASIC METABOLIC PANEL
Anion gap: 10 (ref 5–15)
BUN: 21 mg/dL (ref 8–23)
CO2: 23 mmol/L (ref 22–32)
Calcium: 8.9 mg/dL (ref 8.9–10.3)
Chloride: 105 mmol/L (ref 98–111)
Creatinine, Ser: 0.88 mg/dL (ref 0.44–1.00)
GFR, Estimated: >60 mL/min (ref >60)
Glucose, Bld: 100 mg/dL — ABNORMAL HIGH (ref 70–99)
Potassium: 3.4 mmol/L — ABNORMAL LOW (ref 3.5–5.1)
Sodium: 138 mmol/L (ref 135–145)

## 2022-11-18 LAB — PROTIME-INR
INR: 1 (ref 0.8–1.2)
Prothrombin Time: 13.1 seconds (ref 11.4–15.2)

## 2022-11-18 SURGERY — IR WITH ANESTHESIA
Anesthesia: General

## 2022-11-18 MED ORDER — FENTANYL CITRATE (PF) 100 MCG/2ML IJ SOLN: 25.0000 ug | INTRAMUSCULAR | Status: DC | PRN

## 2022-11-18 MED ORDER — HYDROCHLOROTHIAZIDE 25 MG PO TABS
25.0000 mg | ORAL_TABLET | Freq: Two times a day (BID) | ORAL | Status: DC
  Administered 2022-11-18: 25 mg via ORAL
  Filled 2022-11-18 (×2): qty 1

## 2022-11-18 MED ORDER — LIDOCAINE 2% (20 MG/ML) 5 ML SYRINGE
INTRAMUSCULAR | Status: DC | PRN
  Administered 2022-11-18: 100 mg via INTRAVENOUS

## 2022-11-18 MED ORDER — HEPARIN (PORCINE) 25000 UT/250ML-% IV SOLN
INTRAVENOUS | Status: AC
  Filled 2022-11-18: qty 250

## 2022-11-18 MED ORDER — NIMODIPINE 30 MG PO CAPS
0.0000 mg | ORAL_CAPSULE | ORAL | Status: DC
  Filled 2022-11-18: qty 2

## 2022-11-18 MED ORDER — TICAGRELOR 60 MG PO TABS
60.0000 mg | ORAL_TABLET | Freq: Two times a day (BID) | ORAL | Status: DC
  Filled 2022-11-18 (×3): qty 1

## 2022-11-18 MED ORDER — ACETAMINOPHEN 160 MG/5ML PO SOLN: 650.0000 mg | ORAL | Status: DC | PRN

## 2022-11-18 MED ORDER — TICAGRELOR 60 MG PO TABS
60.0000 mg | ORAL_TABLET | ORAL | Status: DC
  Filled 2022-11-18: qty 1

## 2022-11-18 MED ORDER — IOHEXOL 300 MG/ML  SOLN
150.0000 mL | Freq: Once | INTRAMUSCULAR | Status: AC | PRN
  Administered 2022-11-18: 68 mL via INTRA_ARTERIAL

## 2022-11-18 MED ORDER — AMISULPRIDE (ANTIEMETIC) 5 MG/2ML IV SOLN: 10.0000 mg | Freq: Once | INTRAVENOUS | Status: DC | PRN

## 2022-11-18 MED ORDER — CLEVIDIPINE BUTYRATE 0.5 MG/ML IV EMUL: 0.0000 mg/h | INTRAVENOUS | Status: DC

## 2022-11-18 MED ORDER — DEXAMETHASONE SODIUM PHOSPHATE 10 MG/ML IJ SOLN
10.0000 mg | Freq: Once | INTRAMUSCULAR | Status: AC
  Administered 2022-11-18: 10 mg via INTRAVENOUS
  Filled 2022-11-18: qty 1

## 2022-11-18 MED ORDER — HEPARIN SODIUM (PORCINE) 1000 UNIT/ML IJ SOLN
INTRAMUSCULAR | Status: DC | PRN
  Administered 2022-11-18: 1000 [IU] via INTRAVENOUS
  Administered 2022-11-18: 3000 [IU] via INTRAVENOUS
  Administered 2022-11-18: 1000 [IU] via INTRAVENOUS

## 2022-11-18 MED ORDER — SODIUM CHLORIDE 0.9 % IV SOLN: INTRAVENOUS | Status: DC

## 2022-11-18 MED ORDER — LACTATED RINGERS IV SOLN: INTRAVENOUS | Status: DC

## 2022-11-18 MED ORDER — PHENYLEPHRINE HCL-NACL 20-0.9 MG/250ML-% IV SOLN
0.0000 ug/min | INTRAVENOUS | Status: DC
  Filled 2022-11-18: qty 250

## 2022-11-18 MED ORDER — OXYCODONE HCL 5 MG PO TABS: 5.0000 mg | ORAL_TABLET | Freq: Once | ORAL | Status: DC | PRN

## 2022-11-18 MED ORDER — IRBESARTAN 150 MG PO TABS: 300.0000 mg | ORAL_TABLET | Freq: Every day | ORAL | Status: DC

## 2022-11-18 MED ORDER — TICAGRELOR 60 MG PO TABS
60.0000 mg | ORAL_TABLET | Freq: Two times a day (BID) | ORAL | Status: DC
  Administered 2022-11-18 – 2022-11-19 (×2): 60 mg via ORAL
  Filled 2022-11-18 (×3): qty 1

## 2022-11-18 MED ORDER — ASPIRIN 81 MG PO CHEW
81.0000 mg | CHEWABLE_TABLET | Freq: Every day | ORAL | Status: DC
  Administered 2022-11-19: 81 mg via ORAL
  Filled 2022-11-18: qty 1

## 2022-11-18 MED ORDER — ACETAMINOPHEN 325 MG PO TABS
650.0000 mg | ORAL_TABLET | ORAL | Status: DC | PRN
  Administered 2022-11-18: 650 mg via ORAL
  Filled 2022-11-18: qty 2

## 2022-11-18 MED ORDER — ROCURONIUM BROMIDE 10 MG/ML (PF) SYRINGE
PREFILLED_SYRINGE | INTRAVENOUS | Status: DC | PRN
  Administered 2022-11-18: 10 mg via INTRAVENOUS
  Administered 2022-11-18: 60 mg via INTRAVENOUS
  Administered 2022-11-18 (×2): 20 mg via INTRAVENOUS

## 2022-11-18 MED ORDER — ROSUVASTATIN CALCIUM 5 MG PO TABS
5.0000 mg | ORAL_TABLET | Freq: Every evening | ORAL | Status: DC
  Administered 2022-11-18: 5 mg via ORAL
  Filled 2022-11-18: qty 1

## 2022-11-18 MED ORDER — HEPARIN (PORCINE) 25000 UT/250ML-% IV SOLN
500.0000 [IU]/h | INTRAVENOUS | Status: DC
  Administered 2022-11-18: 500 [IU]/h via INTRAVENOUS

## 2022-11-18 MED ORDER — HEPARIN (PORCINE) 25000 UT/250ML-% IV SOLN
600.0000 [IU]/h | INTRAVENOUS | Status: DC
  Administered 2022-11-18: 600 [IU]/h via INTRAVENOUS

## 2022-11-18 MED ORDER — PROPOFOL 10 MG/ML IV BOLUS
INTRAVENOUS | Status: DC | PRN
  Administered 2022-11-18: 150 mg via INTRAVENOUS

## 2022-11-18 MED ORDER — CEFAZOLIN SODIUM-DEXTROSE 2-4 GM/100ML-% IV SOLN
2.0000 g | INTRAVENOUS | Status: AC
  Administered 2022-11-18: 2 g via INTRAVENOUS
  Filled 2022-11-18: qty 100

## 2022-11-18 MED ORDER — ORAL CARE MOUTH RINSE: 15.0000 mL | Freq: Once | OROMUCOSAL | Status: AC

## 2022-11-18 MED ORDER — SODIUM CHLORIDE 0.9 % IV SOLN
INTRAVENOUS | Status: DC
  Administered 2022-11-19: 75 mL/h via INTRAVENOUS

## 2022-11-18 MED ORDER — TICAGRELOR 90 MG PO TABS: 90.0000 mg | ORAL_TABLET | Freq: Two times a day (BID) | ORAL | Status: DC

## 2022-11-18 MED ORDER — PHENYLEPHRINE HCL-NACL 20-0.9 MG/250ML-% IV SOLN
INTRAVENOUS | Status: DC | PRN
  Administered 2022-11-18: 50 ug/min via INTRAVENOUS

## 2022-11-18 MED ORDER — DORZOLAMIDE HCL-TIMOLOL MAL 2-0.5 % OP SOLN
1.0000 [drp] | Freq: Two times a day (BID) | OPHTHALMIC | Status: DC
  Administered 2022-11-18 – 2022-11-19 (×2): 1 [drp] via OPHTHALMIC
  Filled 2022-11-18: qty 10

## 2022-11-18 MED ORDER — ASPIRIN 81 MG PO CHEW: 81.0000 mg | CHEWABLE_TABLET | Freq: Every day | ORAL | Status: DC

## 2022-11-18 MED ORDER — PROTAMINE SULFATE 10 MG/ML IV SOLN
INTRAVENOUS | Status: DC | PRN
  Administered 2022-11-18: 5 mg via INTRAVENOUS

## 2022-11-18 MED ORDER — EPTIFIBATIDE 20 MG/10ML IV SOLN
INTRAVENOUS | Status: AC | PRN
  Administered 2022-11-18 (×2): 1.5 mg via INTRA_ARTERIAL

## 2022-11-18 MED ORDER — ACETAMINOPHEN 650 MG RE SUPP: 650.0000 mg | RECTAL | Status: DC | PRN

## 2022-11-18 MED ORDER — PANTOPRAZOLE SODIUM 40 MG IV SOLR
40.0000 mg | Freq: Once | INTRAVENOUS | Status: AC
  Administered 2022-11-18: 40 mg via INTRAVENOUS
  Filled 2022-11-18: qty 10

## 2022-11-18 MED ORDER — IOHEXOL 300 MG/ML  SOLN
150.0000 mL | Freq: Once | INTRAMUSCULAR | Status: AC | PRN
  Administered 2022-11-18: 50 mL via INTRA_ARTERIAL

## 2022-11-18 MED ORDER — EPTIFIBATIDE 20 MG/10ML IV SOLN
INTRAVENOUS | Status: AC
  Filled 2022-11-18: qty 10

## 2022-11-18 MED ORDER — CHLORHEXIDINE GLUCONATE CLOTH 2 % EX PADS
6.0000 | MEDICATED_PAD | Freq: Every day | CUTANEOUS | Status: DC
  Administered 2022-11-18 – 2022-11-19 (×2): 6 via TOPICAL

## 2022-11-18 MED ORDER — CHLORHEXIDINE GLUCONATE 0.12 % MT SOLN
15.0000 mL | Freq: Once | OROMUCOSAL | Status: AC
  Administered 2022-11-18: 15 mL via OROMUCOSAL
  Filled 2022-11-18: qty 15

## 2022-11-18 MED ORDER — OXYCODONE HCL 5 MG/5ML PO SOLN: 5.0000 mg | Freq: Once | ORAL | Status: DC | PRN

## 2022-11-18 MED ORDER — SUGAMMADEX SODIUM 200 MG/2ML IV SOLN
INTRAVENOUS | Status: DC | PRN
  Administered 2022-11-18: 400 mg via INTRAVENOUS

## 2022-11-18 MED ORDER — TICAGRELOR 60 MG PO TABS: 60.0000 mg | ORAL_TABLET | Freq: Two times a day (BID) | ORAL | Status: DC

## 2022-11-18 MED ORDER — ACETAMINOPHEN 10 MG/ML IV SOLN: 1000.0000 mg | Freq: Once | INTRAVENOUS | Status: DC | PRN

## 2022-11-18 MED ORDER — FENTANYL CITRATE (PF) 250 MCG/5ML IJ SOLN
INTRAMUSCULAR | Status: DC | PRN
  Administered 2022-11-18 (×2): 50 ug via INTRAVENOUS

## 2022-11-18 MED ORDER — HEPARIN (PORCINE) 25000 UT/250ML-% IV SOLN: 500.0000 [IU]/h | INTRAVENOUS | Status: DC

## 2022-11-18 MED ORDER — DEXAMETHASONE SODIUM PHOSPHATE 10 MG/ML IJ SOLN
INTRAMUSCULAR | Status: DC | PRN
  Administered 2022-11-18: 10 mg via INTRAVENOUS

## 2022-11-18 NOTE — Procedures (Signed)
INR.  Status post bilateral common carotid arteriograms and left external carotid arteriogram.  Right CFA approach.  Findings.  1.  Persistent small fistulous communication along the proximal inferior portion of the previous  stent construct draining  into the superior ophthalmic vein.  2.  Status post placement of a 3 mm x 15 mm balloon mounted papyrus covered stent in the proximal portion of the previous construct..   8 French Angio-Seal closure device deployed at the right groin puncture site for hemostasis.  Distal pulses present bilaterally.  Post CT brain demonstrates no ICH   Patient extubated.  Denies any headaches or nausea or vomiting.  Follows simple commands appropriately.  Moves all fours equally proportional to effort.  Pupils right 5 mm , left 4 to 5 mm both sluggishly reactive.  Fatima Sanger MD

## 2022-11-18 NOTE — Progress Notes (Signed)
Orthopedic Tech Progress Note Patient Details:  Rose Marquez 10/24/1960 [id number]  Ortho Devices Type of Ortho Device: Knee Immobilizer Ortho Device/Splint Location: RLE Ortho Device/Splint Interventions: Application   Post Interventions Patient Tolerated: Well  [name] 11/18/2022, 7:13 PM

## 2022-11-18 NOTE — Progress Notes (Signed)
Patient is s/p placement of a 3 mm x 15 mm balloon mounted papyrus covered stent in the proximal portion of the previous stent by Dr. Corliss Skains today.   The procedure occurred w/o difficulty, patient was transferred to NICU in stable condition.   Patient seen with Dr. Corliss Skains.  Laying in bed, NAD, RN and daughter at bedside.  Complaints diplopia and pressure on left eye, has blurry vision but she had blurry vision prior to the procedure as well. Denies HA. RN states that Decadron and Protonix were given.  Daughter will bring patient's eyedrop for glaucoma.    SBP has been less than 120, pt pressure.  Heparin running, pt will receive 60 mg Brilinta tonight.  Neuroexam unremarkable, left pupil slightly bigger than right, about 4 mm.  R CFA site soft, mildly tender. No active bleeding or ecchymosis, R DP 2+.  Lateral movement of left eye causes mild pain, EOM intact.   Patient will remain in NICU for overnight observation, plan for d/c tomorrow if stable. Please page dr. Corliss Skains with questions and concerns overnight.   Willette Brace PA-C 11/18/2022 3:37 PM

## 2022-11-18 NOTE — Progress Notes (Signed)
Patient's vascular site began to bleed more.  Pressure was held.  Dr. Corliss Skains was notified.  Orders were given to hold pressure for 20 minutes with a quick clot.  Place and knee immobilizer on RLE, and two lay patient flat for two hours.

## 2022-11-18 NOTE — Progress Notes (Signed)
ANTICOAGULATION CONSULT NOTE - Initial Consult  Pharmacy Consult for heparin Indication:  neuro IR procedure per Dr. Corliss Skains  Not on File  Patient Measurements: Height: 5\' 6"  (167.6 cm) Weight: 86.2 kg (190 lb) IBW/kg (Calculated) : 59.3 Heparin Dosing Weight: 77.7 kg  Vital Signs: Temp: 98 F (36.7 C) (08/23 1210) Temp Source: Oral (08/23 0554) BP: 109/73 (08/23 1300) Pulse Rate: 86 (08/23 1300)  Labs: Recent Labs    11/18/22 0643  HGB 13.4  HCT 41.3  PLT 206  LABPROT 13.1  INR 1.0  CREATININE 0.88    Estimated Creatinine Clearance: 71.5 mL/min (by C-G formula based on SCr of 0.88 mg/dL).   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 30 tablet 3 11/18/2022 at 0400   dorzolamide-timolol (COSOPT) 22.3-6.8 MG/ML ophthalmic solution Place 1 drop into the left eye 2 (two) times daily.   11/17/2022   hydrochlorothiazide (HYDRODIURIL) 25 MG tablet Take 1 tablet (25 mg total) by mouth 2 (two) times daily. 30 tablet 0 11/18/2022 at 0400   Multiple Vitamin (MULTIVITAMIN WITH MINERALS) TABS tablet Take 1 tablet by mouth daily.   Past Week   olmesartan (BENICAR) 40 MG tablet Take 40 mg by mouth daily.   11/18/2022 at 0400   rosuvastatin (CRESTOR) 5 MG tablet Take 5 mg by mouth every evening.   11/17/2022   ticagrelor (BRILINTA) 60 MG TABS tablet Take 1 tablet (60 mg total) by mouth 2 (two) times daily. 60 tablet 0 11/18/2022 at 0400   Scheduled:   Assessment: 72 YOF admitted for an image-guided diagnostic cerebral angiogram with further intervention of left ICA cavernous fistula  Consult is received for heparin gtt therapy starting ASAP and to stop 8/24 @ 08:00 for sheath removal    Goal of Therapy:  Heparin level 0.1-0.25 units/ml Monitor platelets by anticoagulation protocol: Yes   Plan:  Start heparin infusion at 600 units/hr Check anti-Xa level in 8 hours and daily while on heparin Continue to monitor H&H and platelets  Annison Birchard BS, PharmD, BCPS Clinical Pharmacist 11/18/2022 1:33 PM  Contact: [phone] after 3 PM  "Be curious, not judgmental..." -Debbora Dus

## 2022-11-18 NOTE — Progress Notes (Signed)
Notified MD Deveshwar that patient was having swelling of Left eye as well as diplopia.  Orders were given for IV administration of 10mg  decadron as well as 40 mg Protonix in one time doses.  Will administer and continue to assess and act accordingly.

## 2022-11-18 NOTE — Procedures (Signed)
  The note originally documented on this encounter has been moved the the encounter in which it belongs.  

## 2022-11-18 NOTE — Progress Notes (Addendum)
On 1800 Vascular site check it was noted that there was small amount of blood on gauze dressing.  Patients hemodynamics remain stable. This area was marked and RN will continue to recheck q15 minutes to ensure that it does not grow.  If it does pressure will be placed on vascular site.

## 2022-11-18 NOTE — Transfer of Care (Signed)
Immediate Anesthesia Transfer of Care Note  Patient: Rose Marquez  Procedure(s) Performed: Aneurysm Embo  Patient Location: PACU  Anesthesia Type:General  Level of Consciousness: awake and alert   Airway & Oxygen Therapy: Patient Spontanous Breathing and Patient connected to nasal cannula oxygen  Post-op Assessment: Report given to RN  Post vital signs: Reviewed and stable  Last Vitals:  Vitals Value Taken Time  BP 100/66 11/18/22 1215  Temp    Pulse 89 11/18/22 1218  Resp 17 11/18/22 1218  SpO2 94 % 11/18/22 1218  Vitals shown include unfiled device data.  Last Pain:  Vitals:   11/18/22 0654  TempSrc:   PainSc: 0-No pain      Patients Stated Pain Goal: 0 (11/18/22 0654)  Complications: No notable events documented.

## 2022-11-18 NOTE — Anesthesia Procedure Notes (Signed)
Procedure Name: Intubation Date/Time: 11/18/2022 8:32 AM  Performed by: Gwenyth Allegra, CRNAPre-anesthesia Checklist: Patient identified, Emergency Drugs available, Suction available, Patient being monitored and Timeout performed Patient Re-evaluated:Patient Re-evaluated prior to induction Oxygen Delivery Method: Circle system utilized Preoxygenation: Pre-oxygenation with 100% oxygen Induction Type: IV induction Laryngoscope Size: Mac and 4 Grade View: Grade II Tube type: Oral Tube size: 7.0 mm Airway Equipment and Method: Stylet Placement Confirmation: ETT inserted through vocal cords under direct vision, positive ETCO2 and breath sounds checked- equal and bilateral Secured at: 21 cm Tube secured with: Tape Dental Injury: Teeth and Oropharynx as per pre-operative assessment

## 2022-11-18 NOTE — Anesthesia Postprocedure Evaluation (Signed)
Anesthesia Post Note  Patient: Rose Marquez  Procedure(s) Performed: Aneurysm Embo IR TRANSCATH/EMBOLIZ IR 3D INDEPENDENT WKST IR CT HEAD LTD IR ANGIO EXTERNAL CAROTID SEL EXT CAROTID UNI L MOD SED     Patient location during evaluation: PACU Anesthesia Type: General Level of consciousness: awake Pain management: pain level controlled Vital Signs Assessment: post-procedure vital signs reviewed and stable Respiratory status: spontaneous breathing, nonlabored ventilation and respiratory function stable Cardiovascular status: blood pressure returned to baseline and stable Postop Assessment: no apparent nausea or vomiting Anesthetic complications: no   No notable events documented.  Last Vitals: There were no vitals filed for this visit.  Last Pain: There were no vitals filed for this visit.               Linton Rump

## 2022-11-18 NOTE — Sedation Documentation (Signed)
Daughter updated as to patient's status. In waiting room.

## 2022-11-18 NOTE — Sedation Documentation (Signed)
Family updated as to patient's status.

## 2022-11-19 ENCOUNTER — Encounter (HOSPITAL_COMMUNITY): Payer: Self-pay | Admitting: Interventional Radiology

## 2022-11-19 ENCOUNTER — Other Ambulatory Visit: Payer: Self-pay | Admitting: Student

## 2022-11-19 DIAGNOSIS — I671 Cerebral aneurysm, nonruptured: Secondary | ICD-10-CM | POA: Diagnosis not present

## 2022-11-19 LAB — CBC WITH DIFFERENTIAL/PLATELET
Abs Immature Granulocytes: 0.08 10*3/uL — ABNORMAL HIGH (ref 0.00–0.07)
Basophils Absolute: 0 10*3/uL (ref 0.0–0.1)
Basophils Relative: 0 %
Eosinophils Absolute: 0 10*3/uL (ref 0.0–0.5)
Eosinophils Relative: 0 %
HCT: 34.8 % — ABNORMAL LOW (ref 36.0–46.0)
Hemoglobin: 11.3 g/dL — ABNORMAL LOW (ref 12.0–15.0)
Immature Granulocytes: 1 %
Lymphocytes Relative: 8 %
Lymphs Abs: 1.1 10*3/uL (ref 0.7–4.0)
MCH: 26.5 pg (ref 26.0–34.0)
MCHC: 32.5 g/dL (ref 30.0–36.0)
MCV: 81.5 fL (ref 80.0–100.0)
Monocytes Absolute: 0.3 10*3/uL (ref 0.1–1.0)
Monocytes Relative: 2 %
Neutro Abs: 13.1 10*3/uL — ABNORMAL HIGH (ref 1.7–7.7)
Neutrophils Relative %: 89 %
Platelets: 232 10*3/uL (ref 150–400)
RBC: 4.27 MIL/uL (ref 3.87–5.11)
RDW: 15.5 % (ref 11.5–15.5)
WBC: 14.5 10*3/uL — ABNORMAL HIGH (ref 4.0–10.5)
nRBC: 0 % (ref 0.0–0.2)

## 2022-11-19 LAB — HEPARIN LEVEL (UNFRACTIONATED): Heparin Unfractionated: 0.27 [IU]/mL — ABNORMAL LOW (ref 0.30–0.70)

## 2022-11-19 LAB — BASIC METABOLIC PANEL
Anion gap: 10 (ref 5–15)
BUN: 11 mg/dL (ref 8–23)
CO2: 20 mmol/L — ABNORMAL LOW (ref 22–32)
Calcium: 8.2 mg/dL — ABNORMAL LOW (ref 8.9–10.3)
Chloride: 110 mmol/L (ref 98–111)
Creatinine, Ser: 0.7 mg/dL (ref 0.44–1.00)
GFR, Estimated: >60 mL/min (ref >60)
Glucose, Bld: 120 mg/dL — ABNORMAL HIGH (ref 70–99)
Potassium: 3.2 mmol/L — ABNORMAL LOW (ref 3.5–5.1)
Sodium: 140 mmol/L (ref 135–145)

## 2022-11-19 MED ORDER — TICAGRELOR 60 MG PO TABS: 60.0000 mg | ORAL_TABLET | Freq: Two times a day (BID) | ORAL | 0 refills | Status: DC

## 2022-11-19 MED ORDER — POTASSIUM CHLORIDE CRYS ER 20 MEQ PO TBCR
40.0000 meq | EXTENDED_RELEASE_TABLET | ORAL | Status: AC
  Administered 2022-11-19 (×2): 40 meq via ORAL
  Filled 2022-11-19 (×2): qty 2

## 2022-11-19 MED ORDER — HEPARIN (PORCINE) 25000 UT/250ML-% IV SOLN: 500.0000 [IU]/h | INTRAVENOUS | Status: AC

## 2022-11-19 NOTE — Evaluation (Signed)
Physical Therapy Evaluation and Discharge Patient Details Name: Rose Marquez MRN: [id number] DOB: 1960/06/11 Today's Date: 11/19/2022  History of Present Illness  Pt is a 62 y.o. female who presented  11/18/22 for retreatment of giant aneurysm due to return of visual symptoms.   PMH:  left ICA cavernous aneurysm with enlargement of the left superior ophthalmic vein. She is s/p diagnostic cerebral arteriogram 09/17/20. 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  Clinical Impression  Patient evaluated by Physical Therapy with no further acute PT needs identified. All education has been completed and the patient has no further questions. Pt from home alone but daughter is here for a week. Pt mod I with mobility on eval. Was mildly unsteady at first but improved as she was up. Educated pt on brain rest as she has gone straight back to work with each procedure. No further acute PT needs.  See below for any follow-up Physical Therapy or equipment needs. PT is signing off. Thank you for this referral.         If plan is discharge home, recommend the following: Assist for transportation;Assistance with cooking/housework   Can travel by private vehicle        Equipment Recommendations None recommended by PT  Recommendations for Other Services       Functional Status Assessment Patient has not had a recent decline in their functional status     Precautions / Restrictions Precautions Precautions: Fall Precaution Comments: visual impairment but denies falls at home Restrictions Weight Bearing Restrictions: No      Mobility  Bed Mobility Overal bed mobility: Modified Independent                  Transfers Overall transfer level: Modified independent                      Ambulation/Gait Ambulation/Gait assistance: Modified independent  (Device/Increase time) Gait Distance (Feet): 250 Feet Assistive device: None Gait Pattern/deviations: Step-through pattern Gait velocity: slowed to [facility] Gait velocity interpretation: >4.37 ft/sec, indicative of normal walking speed   General Gait Details: mildly unsteady at first but improved with distance  Stairs            Wheelchair Mobility     Tilt Bed    Modified Rankin (Stroke Patients Only)       Balance Overall balance assessment: No apparent balance deficits (not formally assessed)                                           Pertinent Vitals/Pain Pain Assessment Pain Assessment: No/denies pain    Home Living Family/patient expects to be discharged to:: Private residence Living Arrangements: Alone Available Help at Discharge: Family;Available 24 hours/day (for a week) Type of Home: House Home Access: Stairs to enter Entrance Stairs-Rails: Right Entrance Stairs-Number of Steps: 5   Home Layout: One level Home Equipment: None Additional Comments: daughter lives in [place] but is here for the week and then pt will be alone again    Prior Function Prior Level of Function : Independent/Modified Independent;Working/employed;Driving             Mobility Comments: 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 Can potentially do from home but is a lot of computer work which  has been hurting her eyes ADLs Comments: independent     Extremity/Trunk Assessment   Upper Extremity Assessment Upper Extremity Assessment: Overall WFL for tasks assessed    Lower Extremity Assessment Lower Extremity Assessment: Overall WFL for tasks assessed    Cervical / Trunk Assessment Cervical / Trunk Assessment: Normal  Communication   Communication Communication: No apparent difficulties  Cognition Arousal: Alert Behavior During Therapy: WFL for tasks assessed/performed Overall Cognitive Status: Within Functional Limits for tasks assessed                                           General Comments General comments (skin integrity, edema, etc.): discussed brain rest for next 2 weeks as well as getting back to regular exercise program    Exercises     Assessment/Plan    PT Assessment Patient does not need any further PT services  PT Problem List         PT Treatment Interventions      PT Goals (Current goals can be found in the Care Plan section)  Acute Rehab PT Goals Patient Stated Goal: return home PT Goal Formulation: With patient    Frequency       Co-evaluation               AM-PAC PT "6 Clicks" Mobility  Outcome Measure Help needed turning from your back to your side while in a flat bed without using bedrails?: None Help needed moving from lying on your back to sitting on the side of a flat bed without using bedrails?: None Help needed moving to and from a bed to a chair (including a wheelchair)?: None Help needed standing up from a chair using your arms (e.g., wheelchair or bedside chair)?: None Help needed to walk in hospital room?: None Help needed climbing 3-5 steps with a railing? : None 6 Click Score: 24    End of Session Equipment Utilized During Treatment: Gait belt Activity Tolerance: Patient tolerated treatment well Patient left: in bed;with call bell/phone within reach;with family/visitor present Nurse Communication: Mobility status PT Visit Diagnosis: Difficulty in walking, not elsewhere classified (R26.2)    Time: 1610-9604 PT Time Calculation (min) (ACUTE ONLY): 25 min   Charges:   PT Evaluation $PT Eval Moderate Complexity: 1 Mod PT Treatments $Gait Training: 8-22 mins PT General Charges $$ ACUTE PT VISIT: 1 Visit         Lyanne Co, PT  Acute Rehab Services Secure chat preferred Office [phone]   Lawana Chambers Hung Rhinesmith 11/19/2022, 12:20 PM

## 2022-11-19 NOTE — Progress Notes (Signed)
ANTICOAGULATION CONSULT NOTE   Pharmacy Consult for heparin Indication:  s/p neuro IR procedure  Not on File  Patient Measurements: Height: 5\' 6"  (167.6 cm) Weight: 86.2 kg (190 lb) IBW/kg (Calculated) : 59.3 Heparin Dosing Weight: 77.7 kg  Vital Signs: Temp: 98.3 F (36.8 C) (08/24 0000) Temp Source: Oral (08/24 0000) BP: 105/57 (08/24 0000) Pulse Rate: 65 (08/24 0000)  Labs: Recent Labs    11/18/22 0643 11/19/22 0108  HGB 13.4  --   HCT 41.3  --   PLT 206  --   LABPROT 13.1  --   INR 1.0  --   HEPARINUNFRC  --  0.27*  CREATININE 0.88  --     Estimated Creatinine Clearance: 71.5 mL/min (by C-G formula based on SCr of 0.88 mg/dL).   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 30 tablet 3 11/18/2022 at 0400   dorzolamide-timolol (COSOPT) 22.3-6.8 MG/ML ophthalmic solution Place 1 drop into the left eye 2 (two) times daily.   11/17/2022   hydrochlorothiazide (HYDRODIURIL) 25 MG tablet Take 1 tablet (25 mg total) by mouth 2 (two) times daily. 30 tablet 0 11/18/2022 at 0400   Multiple Vitamin (MULTIVITAMIN WITH MINERALS) TABS tablet Take 1 tablet by mouth daily.   Past Week   olmesartan (BENICAR) 40 MG tablet Take 40 mg by mouth daily.   11/18/2022 at 0400   rosuvastatin (CRESTOR) 5 MG tablet Take 5 mg by mouth every evening.   11/17/2022   ticagrelor (BRILINTA) 60 MG TABS tablet Take 1 tablet (60 mg total) by mouth 2 (two) times daily. 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

## 2022-11-19 NOTE — Plan of Care (Signed)
Patient progressing toward goals. Main goals at this time is to maintain vascular access site at level 0 and without oozing. In the AM- work on mobility and nutrition. Manage pain and improvement if neurological symptoms. Also main goal to have adequate perfusion with maintenance of BP within goals.

## 2022-11-19 NOTE — Progress Notes (Signed)
Overnight the right groin site level 0. No more oozing. Knee immobilizer kept until 6am. The patient is on her heparin gtt and took her Brilinta. This AM anxious about A-line SBP above 140 but under 160. Neo gtt decreased at then day nurse stopped this. Cuff pressures within parameters.

## 2022-11-19 NOTE — Discharge Summary (Signed)
Patient ID: Rose Marquez MRN: [id number] DOB/AGE: 1960/09/17 62 y.o.  Admit date: 11/18/2022 Discharge date: 11/19/2022  Supervising Physician: Julieanne Cotton  Patient Status: [facility] - In-pt  Admission Diagnoses: Brain aneurysm   Discharge Diagnoses:  Principal Problem:   Brain aneurysm Active Problems:   Carotid artery-cavernous sinus fistula   Discharged Condition: good  Hospital Course:   Patient presented to [facility] for an cerebral angiogram with intervention with Dr. Corliss Skains on 11/18/22. Procedure occurred without major complications and patient was transferred to floor in stable condition (extubated w/o difficulty, VSS, R CFA puncture site stable) for overnight observation.   Patient has bleeding from the R CFA puncture site, which resolved with manual pressure x 20 min with quick clot. BP has been low, pt was on neo which has d/c this morning.   Patient awake and alert, daughter and RN at bedside. Left eye proptosis better than yesterday. Vision is at baseline (diplopia and blurry vision due to left glaucoma at baseline.) Emphasized importance of using eyedrop for the glaucoma in left eye, she verbalized understanding.   Had mild HA last night, resolved with Tylenol. R CFA puncture site stable. Has not got out of bed yet, worked with PT, plan to d/c home today.   Patient was instructed to: - No bending, stooping, lifting more than 10 pounds for 2 weeks - No driving for 2 weeks  - Keep the right groin puncture site clean and dry until fully heals, no submerging (sitting in a tub, swimming) for 7 days - Continue to take ASA 81 mg daily  - She will be taking Brilinta 60 mg twice a day for 2 weeks, then 30 mg twice a day for 2 week, and Brilinta might be discontinued after. She will remain ASA 81 mg daily.  - She will be scheduled for follow up visit with Dr. Corliss Skains in 2 weeks, IR schedulers will call her to set up the appointment.   Plan to discharge home today and  follow-up with Dr. Corliss Skains at [facility] in 2 weeks after discharge.  Consults: None  Significant Diagnostic Studies: none   Treatments: placement of a 3 mm x 15 mm balloon mounted papyrus covered stent in the proximal portion of the previous stent   Discharge Exam: Blood pressure 94/67, pulse 93, temperature 98.1 F (36.7 C), resp. rate 18, height 5\' 6"  (1.676 m), weight 190 lb (86.2 kg), SpO2 95%.  Physical Exam Vitals reviewed.  Constitutional:      General: She is not in acute distress.    Appearance: She is not ill-appearing.  HENT:     Head: Normocephalic and atraumatic.  Eyes:     Extraocular Movements: Extraocular movements intact.     Pupils: Pupils are equal, round, and reactive to light.     Comments: Left eye proptosis and redness, stable.   Cardiovascular:     Rate and Rhythm: Normal rate and regular rhythm.     Heart sounds: Normal heart sounds.  Pulmonary:     Effort: Pulmonary effort is normal.     Breath sounds: Normal breath sounds.  Abdominal:     General: Abdomen is flat. Bowel sounds are normal.     Palpations: Abdomen is soft.  Musculoskeletal:     Cervical back: Neck supple.  Skin:    General: Skin is warm and dry.     Coloration: Skin is not jaundiced or pale.     Comments: Positive dressing on R CFA puncture site. Site  is unremarkable with no erythema, edema, tenderness, bleeding or drainage. Dressing  clean, dry, and intact.    Neurological:     Mental Status: She is alert and oriented to person, place, and time.     Comments: Alert, awake, and oriented  Speech and comprehension intact PERRL 4 mm, left slightly larger than right  EOMs intact, no nystagmus, stable diplopia  No facial asymmetry. Tongue midline  Motor power 5/5 all 4 No pronator drift. Fine motor and coordination intact Distal pulses R DP 2+    Psychiatric:        Mood and Affect: Mood normal.        Behavior: Behavior normal.        Judgment: Judgment normal.      Disposition: Discharge disposition: 01-Home or Self Care       Discharge Instructions     Call MD for:   Complete by: As directed    Call MD for:  difficulty breathing, headache or visual disturbances   Complete by: As directed    Call MD for:  extreme fatigue   Complete by: As directed    Call MD for:  hives   Complete by: As directed    Call MD for:  persistant dizziness or light-headedness   Complete by: As directed    Call MD for:  persistant nausea and vomiting   Complete by: As directed    Call MD for:  redness, tenderness, or signs of infection (pain, swelling, redness, odor or green/yellow discharge around incision site)   Complete by: As directed    Call MD for:  severe uncontrolled pain   Complete by: As directed    Call MD for:  temperature >100.4   Complete by: As directed    Diet - low sodium heart healthy   Complete by: As directed    Discharge wound care:   Complete by: As directed    Keep the right groin puncture site covered until fully heals, keep the area clean and dry. Do not submerge (swimming or soaking in a bathtub) for 7 days.   Driving Restrictions   Complete by: As directed    No driving for 2 weeks   Increase activity slowly   Complete by: As directed    Lifting restrictions   Complete by: As directed    No lifting, stooping, bending more than 10 pounds for 2 weeks      Allergies as of 11/19/2022   Not on File      Medication List     TAKE these medications    Aspirin Low Dose 81 MG chewable tablet Generic drug: aspirin Chew 1 tablet (81 mg total) by mouth daily.   dorzolamide-timolol 2-0.5 % ophthalmic solution Commonly known as: COSOPT Place 1 drop into the left eye 2 (two) times daily.   hydrochlorothiazide 25 MG tablet Commonly known as: HYDRODIURIL Take 1 tablet (25 mg total) by mouth 2 (two) times daily.   multivitamin with minerals Tabs tablet Take 1 tablet by mouth daily.   olmesartan 40 MG tablet Commonly  known as: BENICAR Take 40 mg by mouth daily.   rosuvastatin 5 MG tablet Commonly known as: CRESTOR Take 5 mg by mouth every evening.   ticagrelor 60 MG Tabs tablet Commonly known as: Brilinta Take 1 tablet (60 mg total) by mouth 2 (two) times daily.               Discharge Care Instructions  (From admission, onward)  Start     Ordered   11/19/22 0000  Discharge wound care:       Comments: Keep the right groin puncture site covered until fully heals, keep the area clean and dry. Do not submerge (swimming or soaking in a bathtub) for 7 days.   11/19/22 0957            Follow-up Information     Julieanne Cotton, MD Follow up.   Specialties: Interventional Radiology, Radiology Why: 2 week follow up visit with Dr. Corliss Skains. Our schedulers will call you to set up the appointment. Contact information: [address] [place] [address] [phone]                  Electronically Signed: Willette Brace, PA-C 11/19/2022, 11:47 AM   I have spent Less Than 30 Minutes discharging [facility].

## 2022-11-21 ENCOUNTER — Telehealth (HOSPITAL_COMMUNITY): Payer: Self-pay

## 2022-11-21 NOTE — Addendum Note (Signed)
Addendum  created 11/21/22 1811 by Linton Rump, MD   Intraprocedure Staff edited

## 2022-11-21 NOTE — Telephone Encounter (Signed)
Called to schedule 2 wk f/u, no answer, left vm. AB  

## 2022-12-01 ENCOUNTER — Other Ambulatory Visit: Payer: Self-pay | Admitting: Student

## 2022-12-06 ENCOUNTER — Ambulatory Visit (HOSPITAL_COMMUNITY)
Admission: RE | Admit: 2022-12-06 | Discharge: 2022-12-06 | Disposition: A | Discharge status: Home / Self Care | Payer: BC Managed Care – PPO | Source: Ambulatory Visit | Attending: Student | Admitting: Student

## 2022-12-06 DIAGNOSIS — I671 Cerebral aneurysm, nonruptured: Secondary | ICD-10-CM

## 2022-12-12 HISTORY — PX: IR RADIOLOGIST EVAL & MGMT: IMG5224

## 2023-02-28 ENCOUNTER — Other Ambulatory Visit (HOSPITAL_COMMUNITY): Payer: Self-pay | Admitting: Interventional Radiology

## 2023-02-28 DIAGNOSIS — I671 Cerebral aneurysm, nonruptured: Secondary | ICD-10-CM

## 2023-03-25 ENCOUNTER — Ambulatory Visit
Admission: RE | Admit: 2023-03-25 | Discharge: 2023-03-25 | Disposition: A | Discharge status: Home / Self Care | Payer: BC Managed Care – PPO | Source: Ambulatory Visit | Attending: Interventional Radiology | Admitting: Interventional Radiology

## 2023-03-25 DIAGNOSIS — I671 Cerebral aneurysm, nonruptured: Secondary | ICD-10-CM

## 2023-05-31 ENCOUNTER — Telehealth (HOSPITAL_COMMUNITY): Payer: Self-pay

## 2023-05-31 NOTE — Telephone Encounter (Signed)
 Pt agreed to f/u in 6 months with an mri/mra. AB

## 2023-09-06 ENCOUNTER — Encounter (HOSPITAL_COMMUNITY): Payer: Self-pay | Admitting: Interventional Radiology

## 2023-09-06 ENCOUNTER — Other Ambulatory Visit (HOSPITAL_COMMUNITY): Payer: Self-pay | Admitting: Interventional Radiology

## 2023-09-06 DIAGNOSIS — I671 Cerebral aneurysm, nonruptured: Secondary | ICD-10-CM

## 2023-09-22 ENCOUNTER — Encounter (HOSPITAL_COMMUNITY): Payer: Self-pay | Admitting: Interventional Radiology

## 2023-09-25 ENCOUNTER — Ambulatory Visit
Admission: RE | Admit: 2023-09-25 | Discharge: 2023-09-25 | Disposition: A | Discharge status: Home / Self Care | Source: Ambulatory Visit | Attending: Interventional Radiology | Admitting: Interventional Radiology

## 2023-09-25 DIAGNOSIS — I671 Cerebral aneurysm, nonruptured: Secondary | ICD-10-CM

## 2023-10-05 ENCOUNTER — Other Ambulatory Visit (HOSPITAL_COMMUNITY): Payer: Self-pay | Admitting: Interventional Radiology

## 2023-10-05 DIAGNOSIS — I671 Cerebral aneurysm, nonruptured: Secondary | ICD-10-CM

## 2023-10-09 ENCOUNTER — Telehealth (HOSPITAL_COMMUNITY): Payer: Self-pay

## 2023-10-09 NOTE — Telephone Encounter (Signed)
-----   Message from Carlin DELENA Griffon sent at 10/09/2023 12:39 PM EDT ----- Regarding: RE: MRA f/u Walterine Knee. Just spoke with Dr. Dolphus about this patient. The possible dissection noted on last imaging with CTA recommendation is likely old, and Dolphus is not concerned. He states to keep f/u as planned, and stay on Aspirin  and Brilinta  as prescribed until then.  Thank you. Carlin Griffon, PA-C ----- Message ----- From: Carolee Knee BIRCH Sent: 10/09/2023   8:48 AM EDT To: Carlin DELENA Griffon, PA-C Subject: FW: MRA f/u                                    Carlin,   Please see below. I don't think [place] got to this last week and the pt called again this morning.   Thanks,  Erwin ----- Message ----- From: Carolee Knee BIRCH Sent: 10/05/2023   9:47 AM EDT To: [place] Huneycutt, NP Subject: FW: MRA f/u                                    Good morning,   Please see below.   Pt agreed to f/u in 6 months but saw that there was a mention of needing a cta in her report and wanted to make sure everything was ok.   Apparent linear filling defect within the distal cervical left ICA. This level was not included in the field of view on prior MRA examinations and this could reflect artifact, an age-indeterminate dissection or a fenestration. Consider a CTA of the neck for further evaluation.  Please advise.   Thanks,  Erwin ----- Message ----- From: Griffon Carlin DELENA DEVONNA Sent: 10/03/2023   2:07 PM EDT To: Knee BIRCH Carolee Subject: FW: MRA f/u                                    Correction:   Dr. Dolphus recommends 6 month f/u with MRI w/o CM and MRA of the brain. Thank you for arranging this.  Carlin Griffon, PA-C ----- Message ----- From: Griffon Carlin DELENA, PA-C Sent: 10/03/2023   2:04 PM EDT To: Knee BIRCH Carolee Subject: MRA f/u                                        Dr. Dolphus recommends 6 month f/u with MRI (w/ and w/o CM) and MRA of the brain. Thank you for arranging this.  Carlin Griffon,  PA-C

## 2023-10-19 ENCOUNTER — Other Ambulatory Visit: Payer: Self-pay | Admitting: Neurosurgery

## 2023-10-19 DIAGNOSIS — I671 Cerebral aneurysm, nonruptured: Secondary | ICD-10-CM

## 2023-11-07 ENCOUNTER — Other Ambulatory Visit: Payer: Self-pay

## 2023-11-07 ENCOUNTER — Other Ambulatory Visit: Payer: Self-pay | Admitting: Neurosurgery

## 2023-11-07 ENCOUNTER — Ambulatory Visit (HOSPITAL_COMMUNITY)
Admission: RE | Admit: 2023-11-07 | Discharge: 2023-11-07 | Disposition: A | Discharge status: Home / Self Care | Source: Ambulatory Visit | Attending: Neurosurgery | Admitting: Neurosurgery

## 2023-11-07 VITALS — BP 99/71 | HR 87 | Temp 98.7°F | Resp 16 | Ht 66.0 in | Wt 200.0 lb

## 2023-11-07 DIAGNOSIS — I671 Cerebral aneurysm, nonruptured: Secondary | ICD-10-CM | POA: Diagnosis present

## 2023-11-07 HISTORY — PX: IR ANGIO INTRA EXTRACRAN SEL INTERNAL CAROTID UNI L MOD SED: IMG5361

## 2023-11-07 HISTORY — PX: IR 3D INDEPENDENT WKST: IMG2385

## 2023-11-07 LAB — CBC WITH DIFFERENTIAL/PLATELET
Abs Immature Granulocytes: 0.02 K/uL (ref 0.00–0.07)
Basophils Absolute: 0 K/uL (ref 0.0–0.1)
Basophils Relative: 1 %
Eosinophils Absolute: 0.2 K/uL (ref 0.0–0.5)
Eosinophils Relative: 3 %
HCT: 40.6 % (ref 36.0–46.0)
Hemoglobin: 13.1 g/dL (ref 12.0–15.0)
Immature Granulocytes: 0 %
Lymphocytes Relative: 27 %
Lymphs Abs: 1.7 K/uL (ref 0.7–4.0)
MCH: 27.3 pg (ref 26.0–34.0)
MCHC: 32.3 g/dL (ref 30.0–36.0)
MCV: 84.8 fL (ref 80.0–100.0)
Monocytes Absolute: 0.4 K/uL (ref 0.1–1.0)
Monocytes Relative: 7 %
Neutro Abs: 3.8 K/uL (ref 1.7–7.7)
Neutrophils Relative %: 62 %
Platelets: 211 K/uL (ref 150–400)
RBC: 4.79 MIL/uL (ref 3.87–5.11)
RDW: 15.2 % (ref 11.5–15.5)
WBC: 6.1 K/uL (ref 4.0–10.5)
nRBC: 0 % (ref 0.0–0.2)

## 2023-11-07 LAB — BASIC METABOLIC PANEL WITH GFR
Anion gap: 11 (ref 5–15)
BUN: 24 mg/dL — ABNORMAL HIGH (ref 8–23)
CO2: 24 mmol/L (ref 22–32)
Calcium: 9 mg/dL (ref 8.9–10.3)
Chloride: 105 mmol/L (ref 98–111)
Creatinine, Ser: 0.96 mg/dL (ref 0.44–1.00)
GFR, Estimated: >60 mL/min (ref >60)
Glucose, Bld: 101 mg/dL — ABNORMAL HIGH (ref 70–99)
Potassium: 3.7 mmol/L (ref 3.5–5.1)
Sodium: 140 mmol/L (ref 135–145)

## 2023-11-07 LAB — PROTIME-INR
INR: 1 (ref 0.8–1.2)
Prothrombin Time: 13.5 s (ref 11.4–15.2)

## 2023-11-07 MED ORDER — IOHEXOL 300 MG/ML  SOLN
100.0000 mL | Freq: Once | INTRAMUSCULAR | Status: AC | PRN
  Administered 2023-11-07 (×2): 30 mL via INTRA_ARTERIAL

## 2023-11-07 MED ORDER — HYDROCODONE-ACETAMINOPHEN 5-325 MG PO TABS: 1.0000 | ORAL_TABLET | ORAL | Status: DC | PRN

## 2023-11-07 MED ORDER — FENTANYL CITRATE (PF) 100 MCG/2ML IJ SOLN
INTRAMUSCULAR | Status: AC
Start: 2023-11-07 — End: 2023-11-07
  Filled 2023-11-07: qty 2

## 2023-11-07 MED ORDER — MIDAZOLAM HCL 2 MG/2ML IJ SOLN
INTRAMUSCULAR | Status: AC
  Filled 2023-11-07: qty 2

## 2023-11-07 MED ORDER — CEFAZOLIN SODIUM-DEXTROSE 2-4 GM/100ML-% IV SOLN: 2.0000 g | INTRAVENOUS | Status: DC

## 2023-11-07 MED ORDER — HEPARIN SODIUM (PORCINE) 1000 UNIT/ML IJ SOLN
INTRAMUSCULAR | Status: AC
Start: 2023-11-07 — End: 2023-11-07
  Filled 2023-11-07: qty 10

## 2023-11-07 MED ORDER — FENTANYL CITRATE (PF) 100 MCG/2ML IJ SOLN
INTRAMUSCULAR | Status: AC | PRN
  Administered 2023-11-07 (×2): 25 ug via INTRAVENOUS

## 2023-11-07 MED ORDER — IOHEXOL 300 MG/ML  SOLN
100.0000 mL | Freq: Once | INTRAMUSCULAR | Status: AC | PRN
  Administered 2023-11-07 (×2): 35 mL via INTRA_ARTERIAL

## 2023-11-07 MED ORDER — CHLORHEXIDINE GLUCONATE CLOTH 2 % EX PADS: 6.0000 | MEDICATED_PAD | Freq: Once | CUTANEOUS | Status: DC

## 2023-11-07 MED ORDER — HEPARIN SODIUM (PORCINE) 1000 UNIT/ML IJ SOLN
INTRAMUSCULAR | Status: AC | PRN
  Administered 2023-11-07 (×2): 2000 [IU] via INTRAVENOUS

## 2023-11-07 MED ORDER — LIDOCAINE HCL 1 % IJ SOLN
INTRAMUSCULAR | Status: AC
  Filled 2023-11-07: qty 20

## 2023-11-07 MED ORDER — SODIUM CHLORIDE 0.9 % IV SOLN: INTRAVENOUS | Status: DC

## 2023-11-07 MED ORDER — LIDOCAINE HCL 1 % IJ SOLN
10.0000 mL | Freq: Once | INTRAMUSCULAR | Status: AC
  Administered 2023-11-07 (×2): 10 mL via INTRADERMAL

## 2023-11-07 MED ORDER — MIDAZOLAM HCL 2 MG/2ML IJ SOLN
INTRAMUSCULAR | Status: AC | PRN
Start: 2023-11-07 — End: 2023-11-07
  Administered 2023-11-07 (×2): 1 mg via INTRAVENOUS

## 2023-11-07 NOTE — Progress Notes (Signed)
 Patient and friend was given discharge instructions. Both verbalized understanding.

## 2023-11-07 NOTE — H&P (Signed)
 Chief Complaint   CC Fistula  History of Present Illness  Rose Marquez is a 63 y.o. female with a history of left direct CCF having previously undergone multiple treatments including Pipeline embolization and most recently placement of a Papyrus covered stent in August 2024. She is clinically well and presents today for follow-up angiogram.  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   Gallstones    Glaucoma    Headache    HLD (hyperlipidemia)    Hypertension     Past Surgical History   Past Surgical History:  Procedure Laterality Date   CHOLECYSTECTOMY     IR 3D INDEPENDENT WKST  09/18/2020   IR 3D INDEPENDENT WKST  11/18/2022   IR ANGIO EXTERNAL CAROTID SEL EXT CAROTID UNI L MOD SED  11/18/2022   IR ANGIO INTRA EXTRACRAN SEL COM CAROTID INNOMINATE BILAT MOD SED  09/18/2020   IR ANGIO INTRA EXTRACRAN SEL COM CAROTID INNOMINATE BILAT MOD SED  09/17/2020   IR ANGIO INTRA EXTRACRAN SEL COM CAROTID INNOMINATE UNI R MOD SED  11/18/2022   IR ANGIO INTRA EXTRACRAN SEL INTERNAL CAROTID BILAT MOD SED  04/06/2022   IR ANGIO VERTEBRAL SEL VERTEBRAL UNI R MOD SED  09/18/2020   IR ANGIO VERTEBRAL SEL VERTEBRAL UNI R MOD SED  09/17/2020   IR ANGIOGRAM FOLLOW UP STUDY  04/06/2022   IR ANGIOGRAM FOLLOW UP STUDY  11/18/2022   IR CT HEAD LTD  09/18/2020   IR CT HEAD LTD  12/16/2020   IR CT HEAD LTD  04/06/2022   IR CT HEAD LTD  11/18/2022   IR NEURO EACH ADD'L AFTER BASIC UNI LEFT (MS)  04/06/2022   IR RADIOLOGIST EVAL & MGMT  10/12/2020   IR RADIOLOGIST EVAL & MGMT  11/18/2020   IR RADIOLOGIST EVAL & MGMT  01/01/2021   IR RADIOLOGIST EVAL & MGMT  04/15/2021   IR RADIOLOGIST EVAL & MGMT  03/07/2022   IR RADIOLOGIST EVAL & MGMT  04/21/2022   IR RADIOLOGIST EVAL & MGMT  12/12/2022   IR TRANSCATH/EMBOLIZ  09/18/2020   IR TRANSCATH/EMBOLIZ  12/16/2020   IR TRANSCATH/EMBOLIZ  04/06/2022   IR TRANSCATH/EMBOLIZ  11/18/2022   IR US  GUIDE VASC  ACCESS RIGHT  09/18/2020   IR US  GUIDE VASC ACCESS RIGHT  09/17/2020   RADIOLOGY WITH ANESTHESIA N/A 09/18/2020   Procedure: IR WITH ANESTHESIA ANEURYSM EMBOLIZATION;  Surgeon: Dolphus Carrion, MD;  Location: [facility] OR;  Service: Radiology;  Laterality: N/A;   RADIOLOGY WITH ANESTHESIA N/A 12/16/2020   Procedure: RADIOLOGY WITH ANESTHESIA EMBOLIZATION;  Surgeon: Dolphus Carrion, MD;  Location: [facility] OR;  Service: Radiology;  Laterality: N/A;   RADIOLOGY WITH ANESTHESIA N/A 04/06/2022   Procedure: Aneurysm embolization;  Surgeon: Dolphus Carrion, MD;  Location: [facility] OR;  Service: Radiology;  Laterality: N/A;   RADIOLOGY WITH ANESTHESIA N/A 11/18/2022   Procedure: Aneurysm Embo;  Surgeon: Dolphus Carrion, MD;  Location: [facility] OR;  Service: Radiology;  Laterality: N/A;    Social History   Social History   Tobacco Use   Smoking status: Never   Smokeless tobacco: Never  Vaping Use   Vaping status: Never Used  Substance Use Topics   Alcohol use: Never   Drug use: Never    Medications   Prior to Admission medications   Medication Sig Start Date End Date Taking? Authorizing Provider  aspirin  81 MG chewable tablet Chew 1 tablet (81 mg total) by  mouth daily. 09/23/20  Yes Louk, Alexandra M, PA-C  dorzolamide -timolol  (COSOPT ) 22.3-6.8 MG/ML ophthalmic solution Place 1 drop into the left eye 2 (two) times daily. 07/22/21  Yes [provider]  hydrochlorothiazide  (HYDRODIURIL ) 25 MG tablet Take 1 tablet (25 mg total) by mouth 2 (two) times daily. 09/22/20  Yes Louk, Alexandra M, PA-C  olmesartan (BENICAR) 40 MG tablet Take 40 mg by mouth daily. 08/04/21  Yes [provider]  rosuvastatin  (CRESTOR ) 5 MG tablet Take 5 mg by mouth every evening.   Yes [provider]  ticagrelor  (BRILINTA ) 60 MG TABS tablet Take 1 tablet (60 mg total) by mouth 2 (two) times daily. 11/19/22  Yes Han, Aimee H, PA-C  Multiple Vitamin (MULTIVITAMIN WITH MINERALS) TABS tablet Take 1 tablet by mouth  daily.    [provider]    Allergies  Not on File  Review of Systems  ROS  Neurologic Exam  Awake, alert, oriented Memory and concentration grossly intact Speech fluent, appropriate CN grossly intact Motor exam: Upper Extremities Deltoid Bicep Tricep Grip  Right 5/5 5/5 5/5 5/5  Left 5/5 5/5 5/5 5/5   Lower Extremities IP Quad PF DF EHL  Right 5/5 5/5 5/5 5/5 5/5  Left 5/5 5/5 5/5 5/5 5/5   Sensation grossly intact to LT  Imaging  Most recent angiogram reveals slowed filling of the direct left CCF after placement of the Papyrus stent.  Impression  - 63 y.o. female with direct left CCF now one year s/p placement of covered stent.  Plan  - Will proceed with diagnostic cerebral angiogram  I have reviewed the indications for the procedure as well as the details of the procedure and the expected postoperative course and recovery at length with the patient in the office. We have also reviewed in detail the risks, benefits, and alternatives to the procedure. All questions were answered and Rose Marquez provided informed consent to proceed.  Gerldine Maizes, MD [facility]

## 2023-11-14 NOTE — Op Note (Signed)
 ENDOVASCULAR NEUROSURGERY OPERATIVE NOTE   PROCEDURE: Diagnostic Cerebral Angiogram   SURGEON:   Dr. Gerldine Maizes, MD  HISTORY:   The patient is a 63 y.o. yo female with a history of large left cavernous aneurysm and associated direct CC fistula having previously undergone multiple treatments including initial coil embolization, subsequent pipeline embolization, and most recently placement of a covered papyrus stent approximately 1 year ago.  Patient has remained neurologically stable and presents today for routine follow-up angiogram.  APPROACH:   The technical aspects of the procedure as well as its potential risks and benefits were reviewed with the patient. These risks included but were not limited bleeding, infection, allergic reaction, damage to organs/vital structures, stroke, non-diagnostic procedure, and the catastrophic outcomes of heart attack, coma, and death. With an understanding of these risks, informed consent was obtained and witnessed.    The patient was placed in the supine position on the angiography table and the skin of right groin prepped in the usual sterile fashion. The procedure was performed under local anesthesia (1%-solution of bicarbonate-bufferred Lidoacaine) and conscious sedation with Versed  and fentanyl  monitored by the in-suite nurse and myself, including non-invasive blood pressure and continuous pulse oxymetry.    Access to the right common femoral artery was obtained using standard Seldinger technique.  A short 5 French sheath was placed.  HEPARIN :  2000 Units total.    CONTRAST AGENT:  See IR records   FLUOROSCOPY TIME:  See IR records    CATHETER(S) AND WIRE(S):    5-French JB-1 glidecatheter   0.035" glidewire    VESSELS CATHETERIZED:   Left internal carotid Left external carotid Right common femoral  VESSELS STUDIED:   Left internal carotid, head Left internal carotid, 3D rotation Left external carotid, head Right  femoral  PROCEDURAL NARRATIVE:   A 5-Fr JB-1 terumo glide catheter was advanced over a 0.035 glidewire into the aortic arch. The above vessels were then sequentially catheterized and cervical/cerebral angiograms taken. After review of images, the catheter was removed without incident.    INTERPRETATION:   Left internal carotid, head:   Native images reveal coil mass as well as stent construct within the cavernous segment of the left internal carotid artery.  There is a large filling defect within the distal petrous/proximal cavernous segment of the left internal carotid artery within the proximal aspect of the visualized stent construct.  There is tapering opacification of the lumen of the internal carotid artery on the periphery of this filling defect.  This is better delineated on the three-dimensional rotational angiogram.  There is good filling of the distal internal carotid artery and anterior and middle cerebral arteries.  There is very slow filling of the previously seen direct cavernous carotid fistula, with late opacification of the superior ophthalmic vein.  Flow through the fistula is dramatically decreased in comparison to the prior angiogram.  Left external carotid, head:   The left external carotid artery branches are largely unremarkable.  Specifically, there is no supply to the above-described cavernous carotid fistula.  Left internal carotid, 3D rotation Three-dimensional rotational angiographic images were reconstructed on an independent workstation for review.  Axial images revealed the above-described filling defect appears to be a defect within the lumen of the distal petrous and proximal cavernous segment of the carotid, with maintained flow around the periphery of this defect.  The defect does not appear to have a tapering proximal or distal end to suggest acute thrombus.  There is very faint opacification of the previously seen  medially projecting cavernous aneurysm.  Right  femoral:    Normal vessel. No significant atherosclerotic disease. Arterial sheath in adequate position.   DISPOSITION:  Upon completion of the study, the femoral sheath was removed and hemostasis obtained using a 5-Fr Exoseal closure device. Good proximal and distal lower extremity pulses were documented upon achievement of hemostasis. The procedure was well tolerated and no early complications were observed.  The patient was transferred to the recovery area to be positioned flat in bed for 3 hours.    IMPRESSION:  1. 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     Gerldine Maizes, MD [facility]

## 2023-11-15 ENCOUNTER — Encounter (HOSPITAL_COMMUNITY): Payer: Self-pay

## 2023-11-15 ENCOUNTER — Other Ambulatory Visit: Payer: Self-pay | Admitting: Neurosurgery

## 2023-11-15 DIAGNOSIS — I671 Cerebral aneurysm, nonruptured: Secondary | ICD-10-CM

## 2023-11-15 HISTORY — PX: IR ANGIO EXTERNAL CAROTID SEL EXT CAROTID UNI L MOD SED: IMG5370

## 2024-01-10 ENCOUNTER — Encounter: Payer: Self-pay | Admitting: Neurosurgery

## 2024-01-10 ENCOUNTER — Ambulatory Visit: Admitting: Neurosurgery

## 2024-01-10 VITALS — BP 125/88 | HR 82 | Temp 97.8°F | Ht 67.0 in | Wt 206.4 lb

## 2024-01-10 DIAGNOSIS — I671 Cerebral aneurysm, nonruptured: Secondary | ICD-10-CM | POA: Diagnosis not present

## 2024-01-10 DIAGNOSIS — Z95828 Presence of other vascular implants and grafts: Secondary | ICD-10-CM

## 2024-01-10 NOTE — Progress Notes (Signed)
 Chief Complaint: Patient was seen in consultation today for  Chief Complaint  Patient presents with   New Patient (Initial Visit)     Referral Brain aneurysm    at the request of Octavia Charlie Hamilton  Referring Physician(s): Groat,Richard Scott  Rose Marquez is a 63 y.o. 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  This was initially treated with pipeline placement, the fistula itself was not occluded.  She has had multiple additional stent placements, most recently with a covered stent placed 11/18/2022, without occlusion of the fistula.  She has continued proptosis and chemosis.  Her most recent arteriogram from 11/07/2023 demonstrates persistence of a low flow fistula.  The covered stent is completely thrombosed with flow around the stent.    My recommendation is a balloon occlusion test of the left artery to see if the carotid artery can be sacrificed, followed by an attempt at transvenous occlusion of the fistula.  If this is not successful, sacrifice of the internal carotid artery above and below the fistula.  I have discussed the diagnosis with the patient and her friend who accompanied her.  I have explained the above plan.  I have also talked to her about the risks, including the risk of stroke.  She understands and would like to proceed as recommended Assessment & Plan   History of Present Illness  History of the present illness:  She presented acutely in 2022 with abrupt onset of headache, left ocular pain and proptosis.  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  This was treated with pipeline placement.  The fistula was not occluded.  She had multiple additional stent placements, without occlusion of the fistula, the last being  11/18/2022 with a covered stent.  Her most recent arteriogram from 11/07/2023 shows the covered stent is thrombosed with the artery patent around the stent.  There is persistence of a low flow fistula.  She has persistent proptosis and chemosis of the left eye.   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   Gallstones    Glaucoma    Headache    HLD (hyperlipidemia)    Hypertension     Past Surgical History:  Procedure Laterality Date   CHOLECYSTECTOMY     IR 3D INDEPENDENT WKST  09/18/2020   IR 3D INDEPENDENT WKST  11/18/2022   IR 3D INDEPENDENT WKST  11/07/2023   IR ANGIO EXTERNAL CAROTID SEL EXT CAROTID UNI L MOD SED  11/18/2022   IR ANGIO EXTERNAL CAROTID SEL EXT CAROTID UNI L MOD SED  11/15/2023   IR ANGIO INTRA EXTRACRAN SEL COM CAROTID INNOMINATE BILAT MOD SED  09/18/2020   IR ANGIO INTRA EXTRACRAN SEL COM CAROTID INNOMINATE BILAT MOD SED  09/17/2020   IR ANGIO INTRA EXTRACRAN SEL COM CAROTID INNOMINATE UNI R MOD SED  11/18/2022   IR ANGIO INTRA EXTRACRAN SEL INTERNAL CAROTID BILAT MOD SED  04/06/2022   IR ANGIO INTRA EXTRACRAN SEL INTERNAL CAROTID UNI L MOD SED  11/07/2023   IR ANGIO VERTEBRAL SEL VERTEBRAL UNI R MOD SED  09/18/2020   IR ANGIO VERTEBRAL SEL VERTEBRAL UNI R MOD SED  09/17/2020   IR ANGIOGRAM FOLLOW UP STUDY  04/06/2022   IR ANGIOGRAM FOLLOW UP  STUDY  11/18/2022   IR CT HEAD LTD  09/18/2020   IR CT HEAD LTD  12/16/2020   IR CT HEAD LTD  04/06/2022   IR CT HEAD LTD  11/18/2022   IR NEURO EACH ADD'L AFTER BASIC UNI LEFT (MS)  04/06/2022   IR RADIOLOGIST EVAL & MGMT  10/12/2020   IR RADIOLOGIST EVAL & MGMT  11/18/2020   IR RADIOLOGIST EVAL & MGMT  01/01/2021   IR RADIOLOGIST EVAL & MGMT  04/15/2021   IR RADIOLOGIST EVAL & MGMT  03/07/2022   IR RADIOLOGIST EVAL & MGMT  04/21/2022   IR RADIOLOGIST EVAL & MGMT  12/12/2022   IR TRANSCATH/EMBOLIZ  09/18/2020   IR TRANSCATH/EMBOLIZ  12/16/2020   IR TRANSCATH/EMBOLIZ  04/06/2022   IR  TRANSCATH/EMBOLIZ  11/18/2022   IR US  GUIDE VASC ACCESS RIGHT  09/18/2020   IR US  GUIDE VASC ACCESS RIGHT  09/17/2020   RADIOLOGY WITH ANESTHESIA N/A 09/18/2020   Procedure: IR WITH ANESTHESIA ANEURYSM EMBOLIZATION;  Surgeon: Dolphus Carrion, MD;  Location: [facility];  Service: Radiology;  Laterality: N/A;   RADIOLOGY WITH ANESTHESIA N/A 12/16/2020   Procedure: RADIOLOGY WITH ANESTHESIA EMBOLIZATION;  Surgeon: Dolphus Carrion, MD;  Location: [facility];  Service: Radiology;  Laterality: N/A;   RADIOLOGY WITH ANESTHESIA N/A 04/06/2022   Procedure: Aneurysm embolization;  Surgeon: Dolphus Carrion, MD;  Location: [facility];  Service: Radiology;  Laterality: N/A;   RADIOLOGY WITH ANESTHESIA N/A 11/18/2022   Procedure: Aneurysm Embo;  Surgeon: Dolphus Carrion, MD;  Location: [facility];  Service: Radiology;  Laterality: N/A;    Allergies: Patient has no known allergies.  Medications: Prior to Admission medications   Medication Sig Start Date End Date Taking? Authorizing Provider  aspirin  81 MG chewable tablet Chew 1 tablet (81 mg total) by mouth daily. 09/23/20  Yes Louk, Alexandra M, PA-C  dorzolamide -timolol  (COSOPT ) 22.3-6.8 MG/ML ophthalmic solution Place 1 drop into the left eye 2 (two) times daily. 07/22/21  Yes [provider]  hydrochlorothiazide  (HYDRODIURIL ) 25 MG tablet Take 1 tablet (25 mg total) by mouth 2 (two) times daily. 09/22/20  Yes Louk, Alexandra M, PA-C  Multiple Vitamin (MULTIVITAMIN WITH MINERALS) TABS tablet Take 1 tablet by mouth daily.   Yes [provider]  olmesartan (BENICAR) 40 MG tablet Take 40 mg by mouth daily. 08/04/21  Yes [provider]  rosuvastatin  (CRESTOR ) 5 MG tablet Take 5 mg by mouth every evening.   Yes [provider]  ticagrelor  (BRILINTA ) 60 MG TABS tablet Take 1 tablet (60 mg total) by mouth 2 (two) times daily. 11/19/22  Yes Han, Aimee H, PA-C     Family History  Problem Relation Age of Onset   Arthritis Mother     Alzheimer's disease Father    Hypertension Father    Gout Father    Hyperlipidemia Brother    Polycystic ovary syndrome Daughter     Social History   Socioeconomic History   Marital status: Single    Spouse name: Not on file   Number of children: 1   Years of education: Not on file   Highest education level: Not on file  Occupational History   Occupation: IT trainer  Tobacco Use   Smoking status: Never   Smokeless tobacco: Never  Vaping Use   Vaping status: Never Used  Substance and Sexual Activity   Alcohol use: Never   Drug use: Never   Sexual activity: Not Currently    Birth control/protection: Post-menopausal  Other Topics Concern  Not on file  Social History Narrative   Not on file   Social Drivers of Health   Financial Resource Strain: Not on file  Food Insecurity: Not on file  Transportation Needs: Not on file  Physical Activity: Not on file  Stress: Not on file  Social Connections: Not on file    Review of Systems  Respiratory:  Negative for shortness of breath.   Cardiovascular:  Negative for chest pain.    Vital Signs:  BP 125/88 (BP Location: Right Arm, Patient Position: Sitting, Cuff Size: Normal)   Pulse 82   Temp 97.8 F (36.6 C) (Oral)   Ht 5' 7 (1.702 m)   Wt 206 lb 6.4 oz (93.6 kg)   SpO2 100%   BMI 32.33 kg/m   Physical Exam  Her lungs are normal. Heart sounds are normal.  Heart rate is regular. Alert and oriented with normal speech expression, fluency and comprehension. Visual fields are full to confrontation.   Face is symmetric. Strength in the arms and legs is symmetric with no drift.  Sensation is normal and symmetric. No ataxia. No inattention.   Imaging: As above  Results   Labs:  CBC: Recent Labs    11/07/23 0854  WBC 6.1  HGB 13.1  HCT 40.6  PLT 211    COAGS: Recent Labs    11/07/23 0854  INR 1.0    BMP: Recent Labs    11/07/23 0854  NA 140  K 3.7  CL 105  CO2 24  GLUCOSE 101*  BUN 24*   CALCIUM  9.0  CREATININE 0.96  GFRNONAA >60     Electronically Signed: Nancyann LULLA Burns  01/10/2024, 3:50 PM

## 2024-01-10 NOTE — Addendum Note (Signed)
 Encounter addended by: Candie Tawni BRAVO, RT on: 01/10/2024 4:43 PM  Actions taken: Imaging Exam ended

## 2024-01-10 NOTE — Patient Instructions (Signed)
 Please see below for information in regards to your upcoming procedure.  Planned procedure : Carotid-cavernous fistula Embolization with Balloon Trial Occlusion    Time: You will receive a call to schedule your procedure with our Interventional Radiology Department.  * Please note that the time of your procedure may be subject to change up to the morning of your procedure due to unforeseeable Medical Emergencies. We appreciate your patience as we navigate these situations.  Should you not hear anything within 5 days after your office visit, please follow up with us  so we can follow up on scheduling.   Location: [facility] Entrance A -  [address] [place], [place] [address]   Pre-op appointment at [facility] Pre-admit Testing: you will receive a call with a date/time for this appointment. If you are scheduled for an in person appointment, Pre-admit Testing is located at Entrance A. You will enter and check in at patient registration. During this appointment, they will advise you which medications you can take the morning of surgery, and which medications you will need to hold for surgery. Labs (such as blood work, EKG) may be done at your pre-op appointment. You are not required to fast for these labs. Should you need to change your pre-op appointment, please call Pre-admit testing at  [phone].  Eating/ Drinking: You should not eat or drink after midnight (NPO)  before your procedure. You can have small sips of clear liquids with your morning medications but everything else should be avoided.  How to contact us :  If you have any questions/concerns before or after your Procedure you can reach us  at [phone], or you can send a mychart message. We can be reached by phone or mychart 8am-4pm, Monday-Friday.   Registered Nurse/Surgery scheduler:  Tinnie HERO RN-BSN Medical Assistants: Jesslyn DEL CMA,Patricia CMA Doctors: Dino Sable MD, Nancyann Burns MD

## 2024-01-12 ENCOUNTER — Ambulatory Visit: Admitting: Neurosurgery

## 2024-02-05 ENCOUNTER — Other Ambulatory Visit: Payer: Self-pay

## 2024-02-26 ENCOUNTER — Encounter (HOSPITAL_COMMUNITY): Payer: Self-pay | Admitting: Neuroradiology

## 2024-02-26 ENCOUNTER — Other Ambulatory Visit: Payer: Self-pay

## 2024-02-26 NOTE — Progress Notes (Signed)
 SDW CALL  Patient was given pre-op instructions over the phone. The opportunity was given for the patient to ask questions. No further questions asked. Patient verbalized understanding of instructions given.   PCP - Nell Piety Cardiologist - denies  PPM/ICD - denies Device Orders - n/a Rep Notified -  n/a  Chest x-ray - denies EKG - DOS Stress Test denies-  ECHO - 09/21/20 Cardiac Cath - denies  Sleep Study - denies CPAP - n/a  No DM  Last dose of GLP1 agonist-  n/a GLP1 instructions:  n/a  Blood Thinner Instructions: per Dr. Lester, patient is to continue taking Aspirin  and Brilinta  - patient is aware Aspirin  Instructions: continue  ERAS Protcol - NPO PRE-SURGERY Ensure or G2- n/a  COVID TEST- n/a   Anesthesia review: no  Patient denies shortness of breath, fever, cough and chest pain over the phone call   All instructions explained to the patient, with a verbal understanding of the material. Patient agrees to go over the instructions while at home for a better understanding.

## 2024-02-27 LAB — PLATELET INHIBITION P2Y12

## 2024-02-27 NOTE — Anesthesia Preprocedure Evaluation (Signed)
 Anesthesia Evaluation  Patient identified by MRN, date of birth, ID band Patient awake    Reviewed: Allergy & Precautions, H&P , NPO status , Patient's Chart, lab work & pertinent test results  Airway Mallampati: III  TM Distance: >3 FB Neck ROM: Full    Dental no notable dental hx. (+) Teeth Intact, Dental Advisory Given   Pulmonary neg pulmonary ROS   Pulmonary exam normal breath sounds clear to auscultation       Cardiovascular Exercise Tolerance: Good hypertension, Pt. on medications  Rhythm:Regular Rate:Normal     Neuro/Psych  Headaches  negative psych ROS   GI/Hepatic negative GI ROS, Neg liver ROS,,,  Endo/Other  negative endocrine ROS    Renal/GU negative Renal ROS  negative genitourinary   Musculoskeletal   Abdominal   Peds  Hematology negative hematology ROS (+)   Anesthesia Other Findings   Reproductive/Obstetrics negative OB ROS                              Anesthesia Physical Anesthesia Plan  ASA: 2  Anesthesia Plan: General   Post-op Pain Management: Tylenol  PO (pre-op)*   Induction: Intravenous  PONV Risk Score and Plan: 4 or greater and Ondansetron , Dexamethasone  and Treatment may vary due to age or medical condition  Airway Management Planned: Oral ETT  Additional Equipment: Arterial line  Intra-op Plan:   Post-operative Plan: Extubation in OR  Informed Consent: I have reviewed the patients History and Physical, chart, labs and discussed the procedure including the risks, benefits and alternatives for the proposed anesthesia with the patient or authorized representative who has indicated his/her understanding and acceptance.     Dental advisory given  Plan Discussed with: CRNA  Anesthesia Plan Comments:          Anesthesia Quick Evaluation

## 2024-02-28 ENCOUNTER — Inpatient Hospital Stay (HOSPITAL_COMMUNITY): Payer: Self-pay | Admitting: Anesthesiology

## 2024-02-28 ENCOUNTER — Inpatient Hospital Stay (HOSPITAL_COMMUNITY)
Admission: RE | Admit: 2024-02-28 | Discharge: 2024-02-29 | DRG: 027 | Disposition: A | Discharge status: Home / Self Care | Attending: Neuroradiology | Admitting: Neuroradiology

## 2024-02-28 ENCOUNTER — Encounter (HOSPITAL_COMMUNITY): Payer: Self-pay | Admitting: Neuroradiology

## 2024-02-28 ENCOUNTER — Observation Stay (HOSPITAL_COMMUNITY)
Admission: RE | Admit: 2024-02-28 | Discharge: 2024-02-28 | Disposition: A | Source: Ambulatory Visit | Attending: Neuroradiology | Admitting: Neuroradiology

## 2024-02-28 ENCOUNTER — Encounter (HOSPITAL_COMMUNITY): Admission: RE | Disposition: A | Payer: Self-pay | Source: Home / Self Care | Attending: Neuroradiology

## 2024-02-28 ENCOUNTER — Other Ambulatory Visit: Payer: Self-pay

## 2024-02-28 DIAGNOSIS — I671 Cerebral aneurysm, nonruptured: Secondary | ICD-10-CM

## 2024-02-28 DIAGNOSIS — I772 Rupture of artery: Secondary | ICD-10-CM | POA: Diagnosis not present

## 2024-02-28 HISTORY — PX: IR ANGIO VERTEBRAL SEL VERTEBRAL UNI R MOD SED: IMG5368

## 2024-02-28 HISTORY — PX: IR ANGIO INTRA EXTRACRAN SEL INTERNAL CAROTID UNI L MOD SED: IMG5361

## 2024-02-28 HISTORY — PX: IR US GUIDE VASC ACCESS RIGHT: IMG2390

## 2024-02-28 HISTORY — PX: IR ANGIOGRAM FOLLOW UP STUDY: IMG697

## 2024-02-28 HISTORY — PX: IR VENO/JUGULAR LEFT: IMG2274

## 2024-02-28 HISTORY — PX: IR TRANSCATH/EMBOLIZ: IMG695

## 2024-02-28 HISTORY — PX: RADIOLOGY WITH ANESTHESIA: SHX6223

## 2024-02-28 HISTORY — PX: IR VENO SAGITTAL SINUS: IMG687

## 2024-02-28 LAB — TYPE AND SCREEN
ABO/RH(D): O POS
Antibody Screen: NEGATIVE

## 2024-02-28 LAB — POCT ACTIVATED CLOTTING TIME
Activated Clotting Time: 240 s
Activated Clotting Time: 306 s
Activated Clotting Time: 322 s

## 2024-02-28 LAB — PROTIME-INR
INR: 1 (ref 0.8–1.2)
Prothrombin Time: 13.3 s (ref 11.4–15.2)

## 2024-02-28 LAB — CBC
HCT: 42.4 % (ref 36.0–46.0)
Hemoglobin: 13.6 g/dL (ref 12.0–15.0)
MCH: 27 pg (ref 26.0–34.0)
MCHC: 32.1 g/dL (ref 30.0–36.0)
MCV: 84.3 fL (ref 80.0–100.0)
Platelets: 223 K/uL (ref 150–400)
RBC: 5.03 MIL/uL (ref 3.87–5.11)
RDW: 15 % (ref 11.5–15.5)
WBC: 6.8 K/uL (ref 4.0–10.5)
nRBC: 0 % (ref 0.0–0.2)

## 2024-02-28 LAB — BASIC METABOLIC PANEL WITH GFR
Anion gap: 10 (ref 5–15)
BUN: 22 mg/dL (ref 8–23)
CO2: 26 mmol/L (ref 22–32)
Calcium: 9.1 mg/dL (ref 8.9–10.3)
Chloride: 105 mmol/L (ref 98–111)
Creatinine, Ser: 0.92 mg/dL (ref 0.44–1.00)
GFR, Estimated: >60 mL/min (ref >60)
Glucose, Bld: 99 mg/dL (ref 70–99)
Potassium: 3.6 mmol/L (ref 3.5–5.1)
Sodium: 141 mmol/L (ref 135–145)

## 2024-02-28 LAB — MRSA NEXT GEN BY PCR, NASAL: MRSA by PCR Next Gen: NOT DETECTED

## 2024-02-28 LAB — APTT: aPTT: 28 s (ref 24–36)

## 2024-02-28 MED ORDER — FENTANYL CITRATE (PF) 100 MCG/2ML IJ SOLN
25.0000 ug | INTRAMUSCULAR | Status: DC | PRN
  Administered 2024-02-28: 25 ug via INTRAVENOUS

## 2024-02-28 MED ORDER — HEPARIN SODIUM (PORCINE) 1000 UNIT/ML IJ SOLN
INTRAMUSCULAR | Status: DC | PRN
  Administered 2024-02-28: 5000 [IU] via INTRAVENOUS
  Administered 2024-02-28 (×2): 1000 [IU] via INTRAVENOUS

## 2024-02-28 MED ORDER — LIDOCAINE HCL 1 % IJ SOLN
INTRAMUSCULAR | Status: AC
  Filled 2024-02-28: qty 20

## 2024-02-28 MED ORDER — FENTANYL CITRATE (PF) 250 MCG/5ML IJ SOLN
INTRAMUSCULAR | Status: DC | PRN
  Administered 2024-02-28 (×2): 50 ug via INTRAVENOUS

## 2024-02-28 MED ORDER — LIDOCAINE HCL 1 % IJ SOLN
20.0000 mL | Freq: Once | INTRAMUSCULAR | Status: AC
  Administered 2024-02-28: 20 mL

## 2024-02-28 MED ORDER — FENTANYL CITRATE (PF) 100 MCG/2ML IJ SOLN
INTRAMUSCULAR | Status: AC
  Filled 2024-02-28: qty 2

## 2024-02-28 MED ORDER — ORAL CARE MOUTH RINSE: 15.0000 mL | Freq: Once | OROMUCOSAL | Status: AC

## 2024-02-28 MED ORDER — LIDOCAINE 2% (20 MG/ML) 5 ML SYRINGE
INTRAMUSCULAR | Status: DC | PRN
  Administered 2024-02-28: 60 mg via INTRAVENOUS

## 2024-02-28 MED ORDER — ACETAMINOPHEN 650 MG RE SUPP: 650.0000 mg | RECTAL | Status: DC | PRN

## 2024-02-28 MED ORDER — IRBESARTAN 75 MG PO TABS
37.5000 mg | ORAL_TABLET | Freq: Every day | ORAL | Status: DC
  Administered 2024-02-29: 37.5 mg via ORAL
  Filled 2024-02-28 (×2): qty 0.5

## 2024-02-28 MED ORDER — CLEVIDIPINE BUTYRATE 0.5 MG/ML IV EMUL: 0.0000 mg/h | INTRAVENOUS | Status: DC

## 2024-02-28 MED ORDER — ACETAMINOPHEN 160 MG/5ML PO SOLN: 650.0000 mg | ORAL | Status: DC | PRN

## 2024-02-28 MED ORDER — ONDANSETRON HCL 4 MG/2ML IJ SOLN
INTRAMUSCULAR | Status: DC | PRN
  Administered 2024-02-28: 4 mg via INTRAVENOUS

## 2024-02-28 MED ORDER — NITROGLYCERIN 1 MG/10 ML FOR IR/CATH LAB
INTRA_ARTERIAL | Status: AC
  Filled 2024-02-28: qty 10

## 2024-02-28 MED ORDER — IOHEXOL 300 MG/ML  SOLN
150.0000 mL | Freq: Once | INTRAMUSCULAR | Status: AC | PRN
  Administered 2024-02-28: 84 mL via INTRA_ARTERIAL

## 2024-02-28 MED ORDER — VERAPAMIL HCL 2.5 MG/ML IV SOLN: INTRA_ARTERIAL | Status: AC | PRN

## 2024-02-28 MED ORDER — CHLORHEXIDINE GLUCONATE 0.12 % MT SOLN
15.0000 mL | Freq: Once | OROMUCOSAL | Status: AC
  Administered 2024-02-28: 15 mL via OROMUCOSAL
  Filled 2024-02-28: qty 15

## 2024-02-28 MED ORDER — PHENYLEPHRINE HCL-NACL 20-0.9 MG/250ML-% IV SOLN
INTRAVENOUS | Status: DC | PRN
  Administered 2024-02-28: 20 ug/min via INTRAVENOUS

## 2024-02-28 MED ORDER — ORAL CARE MOUTH RINSE: 15.0000 mL | OROMUCOSAL | Status: DC | PRN

## 2024-02-28 MED ORDER — VERAPAMIL HCL 2.5 MG/ML IV SOLN
INTRAVENOUS | Status: AC
  Filled 2024-02-28: qty 2

## 2024-02-28 MED ORDER — ACETAMINOPHEN 500 MG PO TABS
1000.0000 mg | ORAL_TABLET | Freq: Once | ORAL | Status: AC
  Administered 2024-02-28: 1000 mg via ORAL
  Filled 2024-02-28: qty 2

## 2024-02-28 MED ORDER — SUGAMMADEX SODIUM 200 MG/2ML IV SOLN
INTRAVENOUS | Status: DC | PRN
  Administered 2024-02-28: 200 mg via INTRAVENOUS

## 2024-02-28 MED ORDER — DORZOLAMIDE HCL-TIMOLOL MAL 2-0.5 % OP SOLN: 1.0000 [drp] | Freq: Two times a day (BID) | OPHTHALMIC | Status: DC

## 2024-02-28 MED ORDER — IOHEXOL 300 MG/ML  SOLN
100.0000 mL | Freq: Once | INTRAMUSCULAR | Status: AC | PRN
  Administered 2024-02-28: 20 mL via INTRA_ARTERIAL

## 2024-02-28 MED ORDER — LACTATED RINGERS IV SOLN: INTRAVENOUS | Status: DC

## 2024-02-28 MED ORDER — ROSUVASTATIN CALCIUM 5 MG PO TABS
5.0000 mg | ORAL_TABLET | Freq: Every evening | ORAL | Status: DC
  Administered 2024-02-28: 5 mg via ORAL
  Filled 2024-02-28: qty 1

## 2024-02-28 MED ORDER — CHLORHEXIDINE GLUCONATE CLOTH 2 % EX PADS: 6.0000 | MEDICATED_PAD | Freq: Every day | CUTANEOUS | Status: DC

## 2024-02-28 MED ORDER — HYDROCHLOROTHIAZIDE 25 MG PO TABS: 25.0000 mg | ORAL_TABLET | Freq: Every day | ORAL | Status: DC

## 2024-02-28 MED ORDER — ROCURONIUM BROMIDE 10 MG/ML (PF) SYRINGE
PREFILLED_SYRINGE | INTRAVENOUS | Status: DC | PRN
  Administered 2024-02-28: 5 mg via INTRAVENOUS
  Administered 2024-02-28: 20 mg via INTRAVENOUS
  Administered 2024-02-28: 50 mg via INTRAVENOUS
  Administered 2024-02-28: 5 mg via INTRAVENOUS

## 2024-02-28 MED ORDER — SODIUM CHLORIDE 0.9 % IV BOLUS: 250.0000 mL | INTRAVENOUS | Status: AC | PRN

## 2024-02-28 MED ORDER — LACTATED RINGERS IV SOLN: INTRAVENOUS | Status: DC | PRN

## 2024-02-28 MED ORDER — ACETAMINOPHEN 325 MG PO TABS: 650.0000 mg | ORAL_TABLET | ORAL | Status: DC | PRN

## 2024-02-28 MED ORDER — PROPOFOL 10 MG/ML IV BOLUS
INTRAVENOUS | Status: DC | PRN
  Administered 2024-02-28: 150 mg via INTRAVENOUS

## 2024-02-28 MED ORDER — ASPIRIN 81 MG PO TBEC
81.0000 mg | DELAYED_RELEASE_TABLET | Freq: Every day | ORAL | Status: DC
  Administered 2024-02-29: 81 mg via ORAL
  Filled 2024-02-28: qty 1

## 2024-02-28 MED ORDER — HEPARIN SODIUM (PORCINE) 1000 UNIT/ML IJ SOLN
INTRAMUSCULAR | Status: AC
  Filled 2024-02-28: qty 10

## 2024-02-28 MED ORDER — DEXAMETHASONE SOD PHOSPHATE PF 10 MG/ML IJ SOLN
INTRAMUSCULAR | Status: DC | PRN
  Administered 2024-02-28: 10 mg via INTRAVENOUS

## 2024-02-28 NOTE — Transfer of Care (Signed)
 Immediate Anesthesia Transfer of Care Note  Patient: Rose Marquez  Procedure(s) Performed: RADIOLOGY WITH ANESTHESIA  Patient Location: PACU  Anesthesia Type:General  Level of Consciousness: drowsy  Airway & Oxygen Therapy: Patient Spontanous Breathing  Post-op Assessment: Report given to RN and Post -op Vital signs reviewed and stable  Post vital signs: Reviewed and stable  Last Vitals:  Vitals Value Taken Time  BP 125/89   Temp 97.6   Pulse 78   Resp 20   SpO2 98     Last Pain:  Vitals:   02/28/24 0711  TempSrc:   PainSc: 0-No pain         Complications: No notable events documented.

## 2024-02-28 NOTE — Sedation Documentation (Signed)
ACT [id number]

## 2024-02-28 NOTE — Sedation Documentation (Signed)
 Patient transported via stretcher with CRNA to PACU.

## 2024-02-28 NOTE — Sedation Documentation (Addendum)
 Bedside report given to RN. Femoral site assessed - Level 0, no hematoma, dressing is clean, dry, and intact. Radial site assessed - TR band in place, 14cc of air, clean, dry and intact.  Pulses also assessed bilaterally.

## 2024-02-28 NOTE — Sedation Documentation (Signed)
ACT= 322

## 2024-02-28 NOTE — Consult Note (Signed)
 NAME:  Rose Marquez, MRN:  [id number], DOB:  01-Oct-1960, LOS: 1 ADMISSION DATE:  02/28/2024, CONSULTATION DATE:  02/28/2024 REFERRING MD:  Dr. 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 She has continued proptosis and chemosis of left eye. Today 02/28/2024 she underwent cerebral arteriogram, balloon occlusion test and sacrifice of left internal carotid artery with Dr. Lester. PCCM is consulted in this context.   Pertinent  Medical History  HTN  HLD Large cavernous aneurysm s/p pipeline (2022) c/b carotid-cavernous fistula s/p multiple interventions, most recently covered stent 11/18/2022  Significant Hospital Events: Including procedures, antibiotic start and stop dates in addition to other pertinent events   02/28/2024 Admitted to ICU following L ICA sacrifice for management of carotid cavernous fistula   Interim History / Subjective:  Admitted to ICU following procedure. On room air. Alert and oriented with no focal deficits. Complaint of left thigh pain that started at home prior to presenting for surgery.   Objective    Blood pressure 106/80, pulse 63, temperature 97.6 F (36.4 C), resp. rate 12, height 5' 7 (1.702 m), weight 90.7 kg, SpO2 95%.        Intake/Output Summary (Last 24 hours) at 02/28/2024 1402 Last data filed at 02/28/2024 1306 Gross per 24 hour  Intake 1250 ml  Output 380 ml  Net 870 ml   Filed Weights   02/28/24 0636  Weight: 90.7 kg    Examination: General: no acute distress HENT: [place]/AT, left eye proptosis Lungs: breathing comfortably on room air Cardiovascular: regular rate and rhythm  Abdomen: soft, non-tender Extremities: warm, dry, no  edema, right femoral access site dressed with gauze and tegaderm CDI, soft without hematoma, right radial site with TR band in place  Neuro: alert and oriented x 4, follows commands throughout with symmetric strength GU: deferred  Resolved problem list   Assessment and Plan   Left carotid cavernous fistula s/p sacrifice of left ICA History of left cavernous aneurysm s/p pipeline Left eye proptosis, due to left carotid cavernous fistula - Post-op management per neurosurgery - Continue frequent neuro monitoring - SBP goal 100-180, PRN clevidipine  as needed - Continue ASA  History of HTN - Continue formulary alternative to home olmesartan - Holding home HCTZ  Acute left leg pain Began prior to admission. Neurovascular exam intact. - Continue PRN APAP  Labs   CBC: Recent Labs  Lab 02/28/24 0705  WBC 6.8  HGB 13.6  HCT 42.4  MCV 84.3  PLT 223    Basic Metabolic Panel: Recent Labs  Lab 02/28/24 0705  NA 141  K 3.6  CL 105  CO2 26  GLUCOSE 99  BUN 22  CREATININE 0.92  CALCIUM  9.1   GFR: Estimated Creatinine Clearance: 70.4 mL/min (by C-G formula based on SCr of 0.92 mg/dL). Recent Labs  Lab 02/28/24 0705  WBC 6.8    Liver Function Tests: No results for input(s): AST, ALT, ALKPHOS, BILITOT, PROT, ALBUMIN in the last 168 hours. No results for input(s): LIPASE, AMYLASE in the last 168 hours. No results for input(s): AMMONIA in the last 168 hours.  ABG No results found for: PHART, PCO2ART, PO2ART, HCO3, TCO2, ACIDBASEDEF, O2SAT   Coagulation Profile: Recent Labs  Lab 02/28/24 0705  INR 1.0    Cardiac Enzymes: No results for input(s): CKTOTAL, CKMB, CKMBINDEX, TROPONINI in the last 168 hours.  HbA1C: No results found for: HGBA1C  CBG: No results for input(s): GLUCAP in the last 168 hours.  Review of Systems:   Endorses left leg pain and baseline decreased vision out of left eye. Denies headache, CP or  shortness of breath.  Past Medical History:  She,  has a past medical history of Arthritis, Brain aneurysm (08/2020), Gallstones, Glaucoma, Headache, HLD (hyperlipidemia), and Hypertension.   Surgical History:   Past Surgical History:  Procedure Laterality Date   CHOLECYSTECTOMY     IR 3D INDEPENDENT WKST  09/18/2020   IR 3D INDEPENDENT WKST  11/18/2022   IR 3D INDEPENDENT WKST  11/07/2023   IR ANGIO EXTERNAL CAROTID SEL EXT CAROTID UNI L MOD SED  11/18/2022   IR ANGIO EXTERNAL CAROTID SEL EXT CAROTID UNI L MOD SED  11/15/2023   IR ANGIO INTRA EXTRACRAN SEL COM CAROTID INNOMINATE BILAT MOD SED  09/18/2020   IR ANGIO INTRA EXTRACRAN SEL COM CAROTID INNOMINATE BILAT MOD SED  09/17/2020   IR ANGIO INTRA EXTRACRAN SEL COM CAROTID INNOMINATE UNI R MOD SED  11/18/2022   IR ANGIO INTRA EXTRACRAN SEL INTERNAL CAROTID BILAT MOD SED  04/06/2022   IR ANGIO INTRA EXTRACRAN SEL INTERNAL CAROTID UNI L MOD SED  11/07/2023   IR ANGIO VERTEBRAL SEL VERTEBRAL UNI R MOD SED  09/18/2020   IR ANGIO VERTEBRAL SEL VERTEBRAL UNI R MOD SED  09/17/2020   IR ANGIOGRAM FOLLOW UP STUDY  04/06/2022   IR ANGIOGRAM FOLLOW UP STUDY  11/18/2022   IR CT HEAD LTD  09/18/2020   IR CT HEAD LTD  12/16/2020   IR CT HEAD LTD  04/06/2022   IR CT HEAD LTD  11/18/2022   IR NEURO EACH ADD'L AFTER BASIC UNI LEFT (MS)  04/06/2022   IR RADIOLOGIST EVAL & MGMT  10/12/2020   IR RADIOLOGIST EVAL & MGMT  11/18/2020   IR RADIOLOGIST EVAL & MGMT  01/01/2021   IR RADIOLOGIST EVAL & MGMT  04/15/2021   IR RADIOLOGIST EVAL & MGMT  03/07/2022   IR RADIOLOGIST EVAL & MGMT  04/21/2022   IR RADIOLOGIST EVAL & MGMT  12/12/2022   IR TRANSCATH/EMBOLIZ  09/18/2020   IR TRANSCATH/EMBOLIZ  12/16/2020   IR TRANSCATH/EMBOLIZ  04/06/2022   IR TRANSCATH/EMBOLIZ  11/18/2022   IR US  GUIDE VASC ACCESS RIGHT  09/18/2020   IR US  GUIDE VASC ACCESS RIGHT  09/17/2020   RADIOLOGY WITH ANESTHESIA N/A 09/18/2020   Procedure: IR WITH ANESTHESIA ANEURYSM EMBOLIZATION;   Surgeon: Dolphus Carrion, MD;  Location: [facility] OR;  Service: Radiology;  Laterality: N/A;   RADIOLOGY WITH ANESTHESIA N/A 12/16/2020   Procedure: RADIOLOGY WITH ANESTHESIA EMBOLIZATION;  Surgeon: Dolphus Carrion, MD;  Location: [facility] OR;  Service: Radiology;  Laterality: N/A;   RADIOLOGY WITH ANESTHESIA N/A 04/06/2022   Procedure: Aneurysm embolization;  Surgeon: Dolphus Carrion, MD;  Location: [facility] OR;  Service: Radiology;  Laterality: N/A;   RADIOLOGY WITH ANESTHESIA N/A 11/18/2022   Procedure: Aneurysm Embo;  Surgeon: Dolphus Carrion, MD;  Location: [facility] OR;  Service: Radiology;  Laterality: N/A;     Social History:   reports that she has never smoked. She has never used smokeless tobacco. She reports that she does not currently use alcohol. She reports that she does not use drugs.   Family History:  Her family history includes Alzheimer's disease in her  father; Arthritis in her mother; Gout in her father; Hyperlipidemia in her brother; Hypertension in her father; Polycystic ovary syndrome in her daughter.   Allergies No Known Allergies   Home Medications  Prior to Admission medications   Medication Sig Start Date End Date Taking? Authorizing Provider  ASPIRIN  EC ADULT LOW DOSE 81 MG tablet Take 81 mg by mouth daily.   Yes [provider]  dorzolamide -timolol  (COSOPT ) 22.3-6.8 MG/ML ophthalmic solution Place 1 drop into the left eye 2 (two) times daily. 07/22/21  Yes [provider]  hydrochlorothiazide  (HYDRODIURIL ) 25 MG tablet Take 1 tablet by mouth daily. 08/02/23  Yes [provider]  Multiple Vitamin (MULTIVITAMIN WITH MINERALS) TABS tablet Take 1 tablet by mouth daily.   Yes [provider]  olmesartan (BENICAR) 20 MG tablet Take 20 mg by mouth daily.   Yes [provider]  rosuvastatin  (CRESTOR ) 5 MG tablet Take 5 mg by mouth every evening.   Yes [provider]        Rexene LOISE Blush, PA-C [facility] 02/28/24 4:33 PM  Please see [email or web address] for pager details.  From 7A-7P if no response, please call [phone] After hours, please call ELink [phone]

## 2024-02-28 NOTE — Brief Op Note (Signed)
  NEUROSURGERY BRIEF OP NOTE   PREOP DX: Left carotid-cavernous fistula   POSTOP DX: Same  PROCEDURE: Cerebral arteriogram, balloon occlusion test, sacrifice of left internal carotid artery  SURGEON: Nancyann LULLA Burns   ANESTHESIA: GETA  EBL: 100  COMPLICATIONS: No immediate  CONDITION: Stable  FINDINGS:  Left direct carotid cavernous fistula arising from the left internal carotid artery.  Retrograde venous catheterization attempted but not possible  Therapeutic sacrifice of the left internal carotid artery above and below the fistula.  Angiography confirms good anterior communicating artery with normal flow in the left hemisphere Nancyann LULLA Burns  @today @ 12:22 PM

## 2024-02-28 NOTE — H&P (Signed)
 Chief Complaint: Patient was seen in consultation today for      Chief Complaint  Patient presents with   New Patient (Initial Visit)       Referral Brain aneurysm     at the request of Octavia Charlie Hamilton   Referring Physician(s): Groat,Richard Scott   Rose Marquez is a 63 y.o. 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  This was initially treated with pipeline placement, the fistula itself was not occluded.  She has had multiple additional stent placements, most recently with a covered stent placed 11/18/2022, without occlusion of the fistula.  She has continued proptosis and chemosis.  Her most recent arteriogram from 11/07/2023 demonstrates persistence of a low flow fistula.  The covered stent is completely thrombosed with flow around the stent.     My recommendation is a balloon occlusion test of the left artery to see if the carotid artery can be sacrificed, followed by an attempt at transvenous occlusion of the fistula.  If this is not successful, sacrifice of the internal carotid artery above and below the fistula.   I have discussed the diagnosis with the patient and her friend who accompanied her.  I have explained the above plan.  I have also talked to her about the risks, including the risk of stroke.  She understands and would like to proceed as recommended Assessment & Plan     History of Present Illness   History of the present illness:   She presented acutely in 2022 with abrupt onset of headache, left ocular pain and proptosis.  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  This was treated with pipeline placement.  The fistula was not occluded.  She had multiple additional stent placements, without occlusion of the fistula, the last  being 11/18/2022 with a covered stent.  Her most recent arteriogram from 11/07/2023 shows the covered stent is thrombosed with the artery patent around the stent.  There is persistence of a low flow fistula.   She has persistent proptosis and chemosis of the left eye.         Past Medical History:  Diagnosis Date   Arthritis     Brain aneurysm 08/2020   Gallstones     Glaucoma     Headache     HLD (hyperlipidemia)     Hypertension                 Past Surgical History:  Procedure Laterality Date   CHOLECYSTECTOMY       IR 3D INDEPENDENT WKST   09/18/2020   IR 3D INDEPENDENT WKST   11/18/2022   IR 3D INDEPENDENT WKST   11/07/2023   IR ANGIO EXTERNAL CAROTID SEL EXT CAROTID UNI L MOD SED   11/18/2022   IR ANGIO EXTERNAL CAROTID SEL EXT CAROTID UNI L MOD SED   11/15/2023   IR ANGIO INTRA EXTRACRAN SEL COM CAROTID INNOMINATE BILAT MOD SED   09/18/2020   IR ANGIO INTRA EXTRACRAN SEL COM CAROTID INNOMINATE BILAT MOD SED   09/17/2020   IR ANGIO INTRA EXTRACRAN SEL COM CAROTID INNOMINATE UNI R MOD SED   11/18/2022   IR ANGIO INTRA EXTRACRAN SEL INTERNAL CAROTID BILAT MOD SED   04/06/2022   IR ANGIO INTRA EXTRACRAN SEL  INTERNAL CAROTID UNI L MOD SED   11/07/2023   IR ANGIO VERTEBRAL SEL VERTEBRAL UNI R MOD SED   09/18/2020   IR ANGIO VERTEBRAL SEL VERTEBRAL UNI R MOD SED   09/17/2020   IR ANGIOGRAM FOLLOW UP STUDY   04/06/2022   IR ANGIOGRAM FOLLOW UP STUDY   11/18/2022   IR CT HEAD LTD   09/18/2020   IR CT HEAD LTD   12/16/2020   IR CT HEAD LTD   04/06/2022   IR CT HEAD LTD   11/18/2022   IR NEURO EACH ADD'L AFTER BASIC UNI LEFT (MS)   04/06/2022   IR RADIOLOGIST EVAL & MGMT   10/12/2020   IR RADIOLOGIST EVAL & MGMT   11/18/2020   IR RADIOLOGIST EVAL & MGMT   01/01/2021   IR RADIOLOGIST EVAL & MGMT   04/15/2021   IR RADIOLOGIST EVAL & MGMT   03/07/2022   IR RADIOLOGIST EVAL & MGMT   04/21/2022   IR RADIOLOGIST EVAL & MGMT   12/12/2022   IR TRANSCATH/EMBOLIZ   09/18/2020   IR TRANSCATH/EMBOLIZ    12/16/2020   IR TRANSCATH/EMBOLIZ   04/06/2022   IR TRANSCATH/EMBOLIZ   11/18/2022   IR US  GUIDE VASC ACCESS RIGHT   09/18/2020   IR US  GUIDE VASC ACCESS RIGHT   09/17/2020   RADIOLOGY WITH ANESTHESIA N/A 09/18/2020    Procedure: IR WITH ANESTHESIA ANEURYSM EMBOLIZATION;  Surgeon: Dolphus Carrion, MD;  Location: [facility] OR;  Service: Radiology;  Laterality: N/A;   RADIOLOGY WITH ANESTHESIA N/A 12/16/2020    Procedure: RADIOLOGY WITH ANESTHESIA EMBOLIZATION;  Surgeon: Dolphus Carrion, MD;  Location: [facility] OR;  Service: Radiology;  Laterality: N/A;   RADIOLOGY WITH ANESTHESIA N/A 04/06/2022    Procedure: Aneurysm embolization;  Surgeon: Dolphus Carrion, MD;  Location: [facility] OR;  Service: Radiology;  Laterality: N/A;   RADIOLOGY WITH ANESTHESIA N/A 11/18/2022    Procedure: Aneurysm Embo;  Surgeon: Dolphus Carrion, MD;  Location: [facility] OR;  Service: Radiology;  Laterality: N/A;          Allergies: Patient has no known allergies.   Medications:        Prior to Admission medications   Medication Sig Start Date End Date Taking? Authorizing Provider  aspirin  81 MG chewable tablet Chew 1 tablet (81 mg total) by mouth daily. 09/23/20   Yes Louk, Alexandra M, PA-C  dorzolamide -timolol  (COSOPT ) 22.3-6.8 MG/ML ophthalmic solution Place 1 drop into the left eye 2 (two) times daily. 07/22/21   Yes [provider]  hydrochlorothiazide  (HYDRODIURIL ) 25 MG tablet Take 1 tablet (25 mg total) by mouth 2 (two) times daily. 09/22/20   Yes Louk, Alexandra M, PA-C  Multiple Vitamin (MULTIVITAMIN WITH MINERALS) TABS tablet Take 1 tablet by mouth daily.     Yes [provider]  olmesartan (BENICAR) 40 MG tablet Take 40 mg by mouth daily. 08/04/21   Yes [provider]  rosuvastatin  (CRESTOR ) 5 MG tablet Take 5 mg by mouth every evening.     Yes [provider]  ticagrelor  (BRILINTA ) 60 MG TABS tablet Take 1 tablet (60 mg total) by mouth 2 (two) times daily. 11/19/22   Yes Han, Aimee H,  PA-C           Family History  Problem Relation Age of Onset   Arthritis Mother     Alzheimer's disease Father     Hypertension Father     Gout Father     Hyperlipidemia Brother  Polycystic ovary syndrome Daughter            Social History         Socioeconomic History   Marital status: Single      Spouse name: Not on file   Number of children: 1   Years of education: Not on file   Highest education level: Not on file  Occupational History   Occupation: IT TRAINER  Tobacco Use   Smoking status: Never   Smokeless tobacco: Never  Vaping Use   Vaping status: Never Used  Substance and Sexual Activity   Alcohol use: Never   Drug use: Never   Sexual activity: Not Currently      Birth control/protection: Post-menopausal  Other Topics Concern   Not on file  Social History Narrative   Not on file    Social Drivers of Health    Financial Resource Strain: Not on file  Food Insecurity: Not on file  Transportation Needs: Not on file  Physical Activity: Not on file  Stress: Not on file  Social Connections: Not on file      Review of Systems  Respiratory:  Negative for shortness of breath.   Cardiovascular:  Negative for chest pain.     Vital Signs:   BP 125/88 (BP Location: Right Arm, Patient Position: Sitting, Cuff Size: Normal)   Pulse 82   Temp 97.8 F (36.6 C) (Oral)   Ht 5' 7 (1.702 m)   Wt 206 lb 6.4 oz (93.6 kg)   SpO2 100%   BMI 32.33 kg/m    Physical Exam   Her lungs are normal. Heart sounds are normal.  Heart rate is regular. Alert and oriented with normal speech expression, fluency and comprehension. Visual fields are full to confrontation.   Face is symmetric. Strength in the arms and legs is symmetric with no drift.  Sensation is normal and symmetric. No ataxia. No inattention.   I have reviewed and confirmed my history and physical above from 01/10/2024 with no additions or changes. Plan for left internal carotid artery balloon occlusion  test, possible therapeutic occlusion of the left internal carotid artery, transvenous embolization of carotid cavernous fistula.  I again reviewed the risks of the procedure, especially the risk of stroke.  She understands the above and still wants to proceed.  She feels well this morning.  On physical examination this morning her left eye remains proptotic and chemotic.  Lungs are clear. Heart sounds are normal. Abdomen soft. Her radial and pedal pulses are normal and symmetric.

## 2024-02-28 NOTE — Anesthesia Procedure Notes (Signed)
 Procedure Name: Intubation Date/Time: 02/28/2024 9:30 AM  Performed by: Atanacio Arland HERO, CRNAPre-anesthesia Checklist: Patient identified, Emergency Drugs available, Suction available and Patient being monitored Patient Re-evaluated:Patient Re-evaluated prior to induction Oxygen Delivery Method: Circle System Utilized Preoxygenation: Pre-oxygenation with 100% oxygen Induction Type: IV induction Ventilation: Mask ventilation without difficulty Laryngoscope Size: Mac and 4 Grade View: Grade II Tube type: Oral Tube size: 7.0 mm Number of attempts: 1 Airway Equipment and Method: Stylet Placement Confirmation: ETT inserted through vocal cords under direct vision, positive ETCO2 and breath sounds checked- equal and bilateral Secured at: 22 cm Tube secured with: Tape Dental Injury: Teeth and Oropharynx as per pre-operative assessment

## 2024-02-28 NOTE — Plan of Care (Signed)
 Progressing Add All Education: Knowledge of General Education information will improve Add Today at 2145 - Progressing by Rolfe Corean HERO, RN Add Health Behavior/Discharge Planning: Ability to manage health-related needs will improve Add Today at 2145 - Progressing by Rolfe Corean HERO, RN Add Clinical Measurements: Ability to maintain clinical measurements within normal limits will improve Add Today at 2145 - Progressing by Rolfe Corean HERO, RN Add Will remain free from infection Add Today at 2145 - Progressing by Rolfe Corean HERO, RN Add Diagnostic test results will improve Add Today at 2145 - Progressing by Rolfe Corean HERO, RN Add Respiratory complications will improve Add Today at 2145 - Progressing by Rolfe Corean HERO, RN Add Cardiovascular complication will be avoided Add Today at 2145 - Progressing by Rolfe Corean HERO, RN Add Activity: Risk for activity intolerance will decrease Add Today at 2145 - Progressing by Rolfe Corean HERO, RN Add Nutrition: Adequate nutrition will be maintained Add Today at 2145 - Progressing by Rolfe Corean HERO, RN Add Coping: Level of anxiety will decrease Add Today at 2145 - Progressing by Rolfe Corean HERO, RN Add Elimination: Will not experience complications related to bowel motility Add Today at 2145 - Progressing by Rolfe Corean HERO, RN Add Will not experience complications related to urinary retention Add Today at 2145 - Progressing by Rolfe Corean HERO, RN Add Pain Managment: General experience of comfort will improve and/or be controlled Add Today at 2145 - Progressing by Rolfe Corean HERO, RN Add Safety: Ability to remain free from injury will improve Add Today at 2145 - Progressing by Rolfe Corean HERO, RN Add Skin Integrity: Risk for impaired skin integrity will decrease Add Today at 2145 - Progressing by Rolfe Corean HERO, RN Add Education: Understanding of CV disease, CV risk reduction, and recovery process will improve Add Today at 2145 - Progressing by Rolfe Corean HERO, RN Add Individualized Educational Video(s) Add Today at 2145 - Progressing by Rolfe Corean HERO, RN Add Activity: Ability to return to baseline activity level will improve Add Today at 2145 - Progressing by Rolfe Corean HERO, RN Add Cardiovascular: Ability to achieve and maintain adequate cardiovascular perfusion will improve Add Today at 2145 - Progressing by Rolfe Corean HERO, RN Add Vascular access site(s) Level 0-1 will be maintained Add Today at 2145 - Progressing by Rolfe Corean HERO, RN Add Health Behavior/Discharge Planning: Ability to safely manage health-related needs after discharge will improve Add Today at 2145 - Progressing by Rolfe Corean HERO, RN

## 2024-02-28 NOTE — Progress Notes (Signed)
 Pt tr band began to bleed. 6cc air added. After flat bedrest up, pt sat up to eat, felt dizzy and became confused. Pt laid flat, groin slightly bleeding. Groin started bleeding more-pressure held from 8281-8249. Still oozing slightly. Small hematoma present. Quick clot and pressure dressing applied. Dr. Lester to bedside. Orders given to lay flat until tomorrow. Can be at 30 degrees to eat.

## 2024-02-28 NOTE — Anesthesia Postprocedure Evaluation (Signed)
 Anesthesia Post Note  Patient: Rose Marquez  Procedure(s) Performed: RADIOLOGY WITH ANESTHESIA     Patient location during evaluation: PACU Anesthesia Type: General Level of consciousness: awake and alert Pain management: pain level controlled Vital Signs Assessment: post-procedure vital signs reviewed and stable Respiratory status: spontaneous breathing, nonlabored ventilation and respiratory function stable Cardiovascular status: blood pressure returned to baseline and stable Postop Assessment: no apparent nausea or vomiting Anesthetic complications: no   No notable events documented.  Last Vitals:  Vitals:   02/28/24 1330 02/28/24 1345  BP: 116/79   Pulse: 72 63  Resp: 19   Temp: 36.4 C   SpO2: 95%     Last Pain:  Vitals:   02/28/24 1330  TempSrc:   PainSc: Asleep                 [name],W. EDMOND

## 2024-02-28 NOTE — Sedation Documentation (Signed)
 Patient moved to table by staff , secured, hooked to monitors, and now under the care of anesthesia. Please see charting in vitals per CRNA.

## 2024-02-28 NOTE — Sedation Documentation (Signed)
[id number]

## 2024-02-29 ENCOUNTER — Encounter (HOSPITAL_COMMUNITY): Payer: Self-pay | Admitting: Neuroradiology

## 2024-02-29 DIAGNOSIS — I10 Essential (primary) hypertension: Secondary | ICD-10-CM | POA: Diagnosis not present

## 2024-02-29 DIAGNOSIS — I671 Cerebral aneurysm, nonruptured: Secondary | ICD-10-CM | POA: Diagnosis not present

## 2024-02-29 DIAGNOSIS — H0520 Unspecified exophthalmos: Secondary | ICD-10-CM | POA: Diagnosis not present

## 2024-02-29 DIAGNOSIS — H11422 Conjunctival edema, left eye: Secondary | ICD-10-CM | POA: Diagnosis not present

## 2024-02-29 DIAGNOSIS — E785 Hyperlipidemia, unspecified: Secondary | ICD-10-CM | POA: Diagnosis not present

## 2024-02-29 DIAGNOSIS — I6529 Occlusion and stenosis of unspecified carotid artery: Secondary | ICD-10-CM | POA: Diagnosis not present

## 2024-02-29 DIAGNOSIS — E119 Type 2 diabetes mellitus without complications: Secondary | ICD-10-CM | POA: Diagnosis not present

## 2024-02-29 NOTE — Discharge Summary (Signed)
 Physician Discharge Summary   Patient: Rose Marquez MRN: [id number] DOB: [birth year]  Admit date:     02/28/2024  Discharge date: 02/29/24  Discharge Physician: Jayson JINNY Eis, NP per Nancyann Burns MD   PCP: Lenon Nell SAILOR, FNP   Recommendations at discharge:    Discharge Instructions      Continue home medications except for ticagrelor  (Brilinta ) Tylenol  for mild pain Take aspirin  81mg  once daily to prevent clotting. If you are taking ticagrelor  (Brilinta ) then STOP taking ticagrelor  (Brilinta ). ONLY take the aspirin  81mg  once daily.  Take constipation medications so that you are not straining, some combination of over-the-counter miralax, senna, prune juice, or whatever works for you at home Mobilize as able, this is very important for overall recovery  Mild Headache is to be expected.   Access Site Instructions Avoid heavy lifting or straining for 1 week Keep the access site clean and dry   Go to ED if:  Sudden severe headache  New changes in vision or double vision  Weakness, numbness, or trouble speaking develops suddenly  You have uncontrolled bleeding from the access site or your eye/nose  Follow up:     Discharge Diagnoses: Principal Problem:   Carotid-cavernous fistula  Resolved Problems:   * No resolved hospital problems. [facility] Course: Patient admitted on 02/28/2024  for carotid-cavernous fistula. 63 year old female with diabetes hypertension, hyperlipidemia and carotid-cavernous fistula with interventions prior multiple including pipeline stents, who remain persistently symptomatic with proptosis and chemosis, 12/3 she underwent cerebral angiogram with balloon occlusion placed and sacrifice avoid below fistula internal carotid artery. Postop patient was admitted to ICU for close monitoring.  Ambulating, pain controlled, tolerating diet, and voiding at time of discharge. Will follow up in [facility] Neurosurgery Team clinic with instructions provided  as above. Post-hospitalization questions to be answered by [facility] Neurosurgery team. PT/OT saw the patient 12/4 and approved the patient for discharge.     Disposition: Home  DISCHARGE MEDICATION: Allergies as of 02/29/2024   No Known Allergies      Medication List     TAKE these medications    Aspirin  EC Adult Low Dose 81 MG tablet Generic drug: aspirin  EC Take 81 mg by mouth daily.   dorzolamide -timolol  2-0.5 % ophthalmic solution Commonly known as: COSOPT  Place 1 drop into the left eye 2 (two) times daily.   hydrochlorothiazide  25 MG tablet Commonly known as: HYDRODIURIL  Take 1 tablet by mouth daily.   multivitamin with minerals Tabs tablet Take 1 tablet by mouth daily.   olmesartan 20 MG tablet Commonly known as: BENICAR Take 20 mg by mouth daily.   rosuvastatin  5 MG tablet Commonly known as: CRESTOR  Take 5 mg by mouth every evening.         Discharge Exam: No distress Abd soft Lungs clear Heart sounds regular HENT:     Head: Normocephalic.     Nose: Nose normal.  Eyes:     Pupils: Pupils are equal, round, and reactive to light. Left eye conjunctival injection Cardiovascular:     Rate and Rhythm: Normal rate.  Pulmonary:     Effort: Pulmonary effort is normal.  Abdominal:     General: Abdomen is flat.  Musculoskeletal:     Cervical back: Normal range of motion.  Neurological:     Mental Status: Patient is alert and oriented.        Cranial Nerves: Cranial nerves 2-12 are intact.     Sensory: Sensation is intact.  Motor: Motor function is intact.    Coordination: Coordination is intact.  Skin:     Skin at site not assessed due to presence of dressing. Pt denies any bleeding or changes in feeling around site.   Condition at discharge: Stable  Time spent on discharge: 

## 2024-02-29 NOTE — Discharge Instructions (Signed)
 Continue home medications except for ticagrelor  (Brilinta ) Tylenol  for mild pain Take aspirin  81mg  once daily to prevent clotting. If you are taking ticagrelor  (Brilinta ) then STOP taking ticagrelor  (Brilinta ). ONLY take the aspirin  81mg  once daily.  Take constipation medications so that you are not straining, some combination of over-the-counter miralax, senna, prune juice, or whatever works for you at home Mobilize as able, this is very important for overall recovery  Mild Headache is to be expected.   Access Site Instructions Avoid heavy lifting or straining for 1 week Keep the access site clean and dry   Go to ED if:  Sudden severe headache  New changes in vision or double vision  Weakness, numbness, or trouble speaking develops suddenly  You have uncontrolled bleeding from the access site or your eye/nose  Follow up:

## 2024-02-29 NOTE — Evaluation (Addendum)
 Physical Therapy Evaluation & Discharge Patient Details Name: Rose Marquez MRN: [id number] DOB: [birth year] Today's Date: 02/29/2024  History of Present Illness  Pt is a 63 y.o. female who presented 02/28/24 for cerebral arteriogram, balloon occlusion test and sacrifice of left internal carotid artery. 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 The pt appears to be functioning at her baseline, reporting L hip stiffness and pain initially when ambulating which improves as she warms up and ambulates further. She also reports baseline double vision, which MD is aware of. Will defer visual assessment to OT. She does appear to have pain at her L hip flexors, thus educated pt on ways to manage the pain with ice/heat and stretches. She verbalized understanding. She was able to mobilize without LOB, AD, or need for physical assistance. She displays WFL, intact, and symmetrical bil lower and upper extremity strength, sensation, and coordination other than L hip flexion being limited by pain. Recommended pt follow up with MD or request OPPT as needed to address L hip pain. All education completed and questions answered. PT will sign off.        If plan is discharge home, recommend the following: Assist for transportation   Can travel by private vehicle        Equipment Recommendations None recommended by PT  Recommendations for Other Services       Functional Status Assessment Patient has not had a recent decline in their functional status     Precautions / Restrictions Precautions Precautions: Fall (low risk) Restrictions Weight Bearing Restrictions Per Provider Order: No      Mobility  Bed Mobility Overal bed mobility:  Modified Independent             General bed mobility comments: HOB elevated, increased time but no assistance needed to exit L EOB    Transfers Overall transfer level: Needs assistance Equipment used: None Transfers: Sit to/from Stand Sit to Stand: Contact guard assist           General transfer comment: Pt stood from EOB 1x and recliner 1x, slow to rise with first rep, no LOB but mild sway with first rep, more steady with second rep, CGA for safety    Ambulation/Gait Ambulation/Gait assistance: Contact guard assist Gait Distance (Feet): 320 Feet Assistive device: None Gait Pattern/deviations: Step-through pattern, Decreased stance time - left, Decreased weight shift to left, Antalgic Gait velocity: reduced Gait velocity interpretation: 1.31 - 2.62 ft/sec, indicative of limited community ambulator   General Gait Details: Pt initially with antalgic gait pattern with pt favoring her R leg and reducing L weight shifting and stance time. This improved as distance progressed. Pt reports this is due to L hip pain and stiffness initially. No LOB, CGA progressing to supervision with distance  Stairs Stairs: Yes Stairs assistance: Contact guard assist Stair Management: One rail Left, One rail Right, Alternating pattern, Step to pattern, Forwards, No rails Number of Stairs: 10 General stair comments: Ascends initially without rails with step-to pattern but instability noted. Cued pt to use L rail and pt progressed to alternating step pattern. Pt descended with R rail and alternating step pattern. CGA for safety  Wheelchair Mobility     Tilt Bed    Modified Rankin (Stroke Patients Only) Modified Rankin (  Stroke Patients Only) Pre-Morbid Rankin Score: No significant disability Modified Rankin: No significant disability     Balance Overall balance assessment: Mild deficits observed, not formally tested                                           Pertinent  Vitals/Pain Pain Assessment Pain Assessment: Faces Faces Pain Scale: Hurts little more Pain Location: R groin, L hand at IV, L hip flexor muscle Pain Descriptors / Indicators: Discomfort, Grimacing, Guarding Pain Intervention(s): Monitored during session, Limited activity within patient's tolerance, Repositioned    Home Living Family/patient expects to be discharged to:: Private residence Living Arrangements: Alone Available Help at Discharge: Family;Friend(s);Available 24 hours/day (friends likely can stay with pt when duaghter returns to [place] on Sunday) Type of Home: House Home Access: Level entry       Home Layout: One level Home Equipment: None      Prior Function Prior Level of Function : Independent/Modified Independent;Driving             Mobility Comments: no AD 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 Extremity Assessment Upper Extremity Assessment: Overall WFL for tasks assessed;Defer to OT evaluation (symmetrical intact bil upper extremity sensation, strength, and coordination noted with MMT scores of 5 grossly bil)    Lower Extremity Assessment Lower Extremity Assessment: Overall WFL for tasks assessed;LLE deficits/detail (symmetrical intact bil lower extremity sensation, strength, and coordination noted with MMT scores of 5 grossly bil) LLE Deficits / Details: noted some apprehension with L hip ROM and pain with palpation of L hip flexors    Cervical / Trunk Assessment Cervical / Trunk Assessment: Normal  Communication   Communication Communication: No apparent difficulties    Cognition Arousal: Alert Behavior During Therapy: WFL for tasks assessed/performed   PT - Cognitive impairments: No apparent impairments                       PT - Cognition Comments: A&O, follows commands appropriately Following commands: Intact       Cueing Cueing Techniques: Verbal cues     General Comments General comments (skin  integrity, edema, etc.): Educated pt on stretches and heat/ice for L hip flexor relaxation and pain management. Pt verbalized understanding. R groin and wrist observed pre and post mobility with no changes noted, stable throughout.    Exercises     Assessment/Plan    PT Assessment Patient does not need any further PT services  PT Problem List         PT Treatment Interventions      PT Goals (Current goals can be found in the Care Plan section)  Acute Rehab PT Goals Patient Stated Goal: to go home PT Goal Formulation: All assessment and education complete, DC therapy Time For Goal Achievement: 03/01/24 Potential to Achieve Goals: Good    Frequency       Co-evaluation               AM-PAC PT 6 Clicks Mobility  Outcome Measure Help needed turning from your back to your side while in a flat bed without using bedrails?: None Help needed moving from lying on your back to sitting on the side of a flat bed without using bedrails?: None Help needed moving to and from a bed to a chair (including a wheelchair)?:  A Little Help needed standing up from a chair using your arms (e.g., wheelchair or bedside chair)?: A Little Help needed to walk in hospital room?: A Little Help needed climbing 3-5 steps with a railing? : A Little 6 Click Score: 20    End of Session   Activity Tolerance: Patient tolerated treatment well Patient left: with call bell/phone within reach;Other (comment);with family/visitor present (on commode in bathroom) Nurse Communication: Mobility status;Other (comment) (pt on commode in bathroom) PT Visit Diagnosis: Unsteadiness on feet (R26.81);Other abnormalities of gait and mobility (R26.89);Other symptoms and signs involving the nervous system (R29.898)    Time: 9040-8973 PT Time Calculation (min) (ACUTE ONLY): 27 min   Charges:   PT Evaluation $PT Eval Low Complexity: 1 Low PT Treatments $Therapeutic Activity: 8-22 mins PT General Charges $$ ACUTE PT  VISIT: 1 Visit         Theo Ferretti, PT, DPT Acute Rehabilitation Services  Office: [phone]   Theo CHRISTELLA Ferretti 02/29/2024, 2:03 PM

## 2024-02-29 NOTE — Progress Notes (Addendum)
  History  63 year old female with diabetes hypertension, hyperlipidemia and carotid-cavernous fistula with interventions prior multiple including pipeline stents, who remain persistently symptomatic with proptosis and chemosis, 12/3 she underwent cerebral angiogram with balloon occlusion placed and sacrifice avoid below fistula internal carotid artery. Postop patient was admitted to ICU for close monitoring. @1816 : tr band began to bleed. A small hematoma formed despite pressure dressing applied. Dr.Heck put in orders to lay flat until tomorrow.   12/4 @ 0730: The patient has remained flat all night. Only raising to 30 degrees to eat and drink.   Assessment/Plan: The patient demonstrates appropriate postoperative recovery with an intact neurological exam and no new deficits while in bed.  The site was not assessed due to dressing placement. Surrounding tissue appropriate, clean, dry, without signs of infection.  I received report from the day nurse that the patient had some neuro deficits when sitting up. These findings were not present in my exam. Left conjunctival injection still present, but not of neurological concern at this time. With approval of Dr.Heck who would like PT/OT to see her today, she should be able to go home.   LOS: 1 day  Pt/OT eval Potential d/c this evening upon PT/OT approval No other changes to the POC at this time  Subjective: Patient denies any changes to their condition. She reports that she has a slight headache, but it is nothing she cannot manage. She has no concerns or questions at this time.   Objective: Vital signs in last 24 hours: Temp:  [97.6 F (36.4 C)-99.6 F (37.6 C)] 98 F (36.7 C) (12/04 1144) Pulse Rate:  [63-99] 78 (12/04 1100) Resp:  [12-21] 18 (12/04 1100) BP: (90-129)/(56-103) 113/73 (12/04 1100) SpO2:  [92 %-100 %] 95 % (12/04 1100)  Intake/Output from previous day: 12/03 0701 - 12/04 0700 In: 1250 [I.V.:1250] Out: 380 [Urine:350;  Blood:30] Intake/Output this shift: Total I/O In: 240 [P.O.:240] Out: -   PHYSICAL EXAM  HENT:     Head: Normocephalic.     Nose: Nose normal.  Eyes:     Pupils: Pupils are equal, round, and reactive to light. Left eye conjunctival injection Cardiovascular:     Rate and Rhythm: Normal rate.  Pulmonary:     Effort: Pulmonary effort is normal.  Abdominal:     General: Abdomen is flat.  Musculoskeletal:     Cervical back: Normal range of motion.  Neurological:     Mental Status: Patient is alert and oriented.        Cranial Nerves: Cranial nerves 2-12 are intact.     Sensory: Sensation is intact.     Motor: Motor function is intact.    Coordination: Coordination is intact.  Skin:     Skin at site not assessed due to presence of dressing. Pt denies any bleeding or changes in feeling around site.    Lab Results: Recent Labs    02/28/24 0705  WBC 6.8  HGB 13.6  HCT 42.4  PLT 223   BMET Recent Labs    02/28/24 0705  NA 141  K 3.6  CL 105  CO2 26  GLUCOSE 99  BUN 22  CREATININE 0.92  CALCIUM  9.1    Studies/Results: No results found.    Jayson PARAS Avonna Iribe 02/29/2024, 12:13 PM
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
## 2024-02-29 NOTE — Progress Notes (Signed)
 D/C education given to Pt and Pt's daughter, and all questions answered. No printed prescriptions to give or equipment to deliver. IVs removed. Pt taken to car with all belongings.

## 2024-03-07 ENCOUNTER — Telehealth: Payer: Self-pay

## 2024-03-07 NOTE — Telephone Encounter (Signed)
 Patient called to report symptoms. She had a Cerebral arteriogram, balloon occlusion test, sacrifice of left internal carotid artery on 02/28/2024.  Yesterday she developed some pain around her left eye when it is touched or when she moves her eye. She wondered if this was because of her sleeping on this side.   She states that her vision is cloudier than normal. Otherwise she has not had any visual changes.  Pain eye only if it touches the outside of the eye or move the eye  I asked her if she had any other symptoms such as headache, unilateral numbness, tingling or loss of function. She denies these as well as any changes in speech and any drooping of the face.   She does mention that she has some swelling of the jaw and neck along the side of where her aneurysm is.   She wants to know if these symptoms are of concern. I told her that I would discuss with Dr. Lester and let her know.

## 2024-03-07 NOTE — Telephone Encounter (Signed)
 Per Dr. Lester he called the patient.

## 2024-03-13 ENCOUNTER — Encounter: Admitting: Neuroradiology

## 2024-04-01 ENCOUNTER — Ambulatory Visit (INDEPENDENT_AMBULATORY_CARE_PROVIDER_SITE_OTHER): Admitting: Neuroradiology

## 2024-04-01 ENCOUNTER — Encounter: Payer: Self-pay | Admitting: Neuroradiology

## 2024-04-01 VITALS — BP 109/75 | HR 70 | Temp 97.8°F | Ht 67.0 in | Wt 202.8 lb

## 2024-04-01 DIAGNOSIS — I671 Cerebral aneurysm, nonruptured: Secondary | ICD-10-CM | POA: Diagnosis not present

## 2024-04-01 NOTE — Addendum Note (Signed)
 Addended by: Izayah Miner on: 04/01/2024 05:05 PM   Modules accepted: Orders

## 2024-04-01 NOTE — Progress Notes (Signed)
 I had the pleasure of seeing Rose Marquez in the office today for follow-up of a therapeutic left internal carotid artery occlusion done 02/28/2024 after she passed a balloon occlusion test.  This was done for treatment of a direct carotid cavernous fistula which has been treated with multiple flow diverting stents and a covered stent in the past.  Her eye appears to be less proptotic.  Her eye movement is actually quite good.  She does still have some proptosis, and chemosis, although I think it is a little better.  She has had the symptoms for years as she presented with a high flow fistula in June 2022.  For this reason I am not certain how long it may take her symptoms to resolve.  She does feel like her vision is worse in the left eye.  She is able to count fingers in all 4 quadrants.  There does not seem to have been a retinal stroke.  I would like to repeat her arteriogram to confirm that the fistula is actually closed.  We will schedule this in the next couple of weeks.

## 2024-04-03 ENCOUNTER — Encounter: Admitting: Neuroradiology

## 2024-04-03 ENCOUNTER — Other Ambulatory Visit: Payer: Self-pay

## 2024-04-08 ENCOUNTER — Ambulatory Visit (HOSPITAL_COMMUNITY)
Admission: RE | Admit: 2024-04-08 | Discharge: 2024-04-08 | Disposition: A | Source: Ambulatory Visit | Attending: Neuroradiology | Admitting: Neuroradiology

## 2024-04-08 ENCOUNTER — Other Ambulatory Visit: Payer: Self-pay | Admitting: Neuroradiology

## 2024-04-08 ENCOUNTER — Other Ambulatory Visit: Payer: Self-pay

## 2024-04-08 DIAGNOSIS — I671 Cerebral aneurysm, nonruptured: Secondary | ICD-10-CM

## 2024-04-08 HISTORY — PX: IR ANGIO INTRA EXTRACRAN SEL COM CAROTID INNOMINATE UNI R MOD SED: IMG5359

## 2024-04-08 HISTORY — PX: IR US GUIDE VASC ACCESS RIGHT: IMG2390

## 2024-04-08 MED ORDER — MIDAZOLAM HCL 2 MG/2ML IJ SOLN
INTRAMUSCULAR | Status: AC
  Filled 2024-04-08: qty 2

## 2024-04-08 MED ORDER — HEPARIN SODIUM (PORCINE) 1000 UNIT/ML IJ SOLN
INTRAMUSCULAR | Status: AC
  Filled 2024-04-08: qty 10

## 2024-04-08 MED ORDER — IOHEXOL 300 MG/ML  SOLN
100.0000 mL | Freq: Once | INTRAMUSCULAR | Status: AC | PRN
  Administered 2024-04-08: 15 mL via INTRA_ARTERIAL

## 2024-04-08 MED ORDER — HEPARIN SODIUM (PORCINE) 1000 UNIT/ML IJ SOLN
INTRAMUSCULAR | Status: AC | PRN
  Administered 2024-04-08: 3000 [IU] via INTRAVENOUS

## 2024-04-08 MED ORDER — FENTANYL CITRATE (PF) 100 MCG/2ML IJ SOLN
INTRAMUSCULAR | Status: AC | PRN
  Administered 2024-04-08: 50 ug via INTRAVENOUS

## 2024-04-08 MED ORDER — NITROGLYCERIN 1 MG/10 ML FOR IR/CATH LAB
INTRA_ARTERIAL | Status: AC
  Filled 2024-04-08: qty 10

## 2024-04-08 MED ORDER — FENTANYL CITRATE (PF) 100 MCG/2ML IJ SOLN
INTRAMUSCULAR | Status: AC
  Filled 2024-04-08: qty 2

## 2024-04-08 MED ORDER — VERAPAMIL HCL 2.5 MG/ML IV SOLN: INTRA_ARTERIAL | Status: AC | PRN

## 2024-04-08 MED ORDER — LIDOCAINE HCL 1 % IJ SOLN
INTRAMUSCULAR | Status: AC
  Filled 2024-04-08: qty 20

## 2024-04-08 MED ORDER — VERAPAMIL HCL 2.5 MG/ML IV SOLN
INTRAVENOUS | Status: AC
  Filled 2024-04-08: qty 2

## 2024-04-08 MED ORDER — LIDOCAINE HCL 1 % IJ SOLN
10.0000 mL | Freq: Once | INTRAMUSCULAR | Status: AC
  Administered 2024-04-08: 5 mL via INTRADERMAL

## 2024-04-08 NOTE — Discharge Instructions (Signed)

## 2024-04-08 NOTE — H&P (Addendum)
 Rose Marquez is an 64 y.o. female.   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 Today she presents for a repeat arteriogram to confirm closure of her fistula.   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   Gallstones    Glaucoma    Headache    HLD (hyperlipidemia)    Hypertension     Past Surgical History:  Procedure Laterality Date   CHOLECYSTECTOMY     IR 3D INDEPENDENT WKST  09/18/2020   IR 3D INDEPENDENT WKST  11/18/2022   IR 3D INDEPENDENT WKST  11/07/2023   IR ANGIO EXTERNAL CAROTID SEL EXT CAROTID UNI L MOD SED  11/18/2022   IR ANGIO EXTERNAL CAROTID SEL EXT CAROTID UNI L MOD SED  11/15/2023   IR ANGIO INTRA EXTRACRAN SEL COM CAROTID INNOMINATE BILAT MOD SED  09/18/2020   IR ANGIO INTRA EXTRACRAN SEL COM CAROTID INNOMINATE BILAT MOD SED  09/17/2020   IR ANGIO INTRA EXTRACRAN SEL COM CAROTID INNOMINATE UNI R MOD SED  11/18/2022   IR ANGIO INTRA EXTRACRAN SEL INTERNAL CAROTID BILAT MOD SED  04/06/2022   IR ANGIO INTRA EXTRACRAN SEL INTERNAL CAROTID UNI L MOD SED  11/07/2023   IR ANGIO INTRA EXTRACRAN SEL INTERNAL CAROTID UNI L MOD SED  02/28/2024   IR ANGIO VERTEBRAL SEL VERTEBRAL UNI R MOD SED  09/18/2020   IR ANGIO VERTEBRAL SEL VERTEBRAL UNI R MOD SED  09/17/2020   IR ANGIO VERTEBRAL SEL VERTEBRAL UNI R MOD SED  02/28/2024   IR ANGIOGRAM FOLLOW UP STUDY  04/06/2022   IR ANGIOGRAM FOLLOW UP STUDY  11/18/2022   IR ANGIOGRAM FOLLOW UP STUDY  02/28/2024   IR ANGIOGRAM FOLLOW UP STUDY  02/28/2024   IR ANGIOGRAM FOLLOW UP STUDY  02/28/2024   IR ANGIOGRAM FOLLOW UP STUDY  02/28/2024   IR ANGIOGRAM FOLLOW UP STUDY  02/28/2024   IR ANGIOGRAM FOLLOW UP STUDY  02/28/2024   IR CT HEAD LTD  09/18/2020   IR CT HEAD LTD  12/16/2020   IR CT HEAD LTD  04/06/2022   IR CT HEAD LTD  11/18/2022   IR NEURO EACH ADD'L AFTER BASIC UNI  LEFT (MS)  04/06/2022   IR RADIOLOGIST EVAL & MGMT  10/12/2020   IR RADIOLOGIST EVAL & MGMT  11/18/2020   IR RADIOLOGIST EVAL & MGMT  01/01/2021   IR RADIOLOGIST EVAL & MGMT  04/15/2021   IR RADIOLOGIST EVAL & MGMT  03/07/2022   IR RADIOLOGIST EVAL & MGMT  04/21/2022   IR RADIOLOGIST EVAL & MGMT  12/12/2022   IR TRANSCATH/EMBOLIZ  09/18/2020   IR TRANSCATH/EMBOLIZ  12/16/2020   IR TRANSCATH/EMBOLIZ  04/06/2022   IR TRANSCATH/EMBOLIZ  11/18/2022   IR TRANSCATH/EMBOLIZ  02/28/2024   IR US  GUIDE VASC ACCESS RIGHT  09/18/2020   IR US  GUIDE VASC ACCESS RIGHT  09/17/2020   IR US  GUIDE VASC ACCESS RIGHT  02/28/2024   IR US  GUIDE VASC ACCESS RIGHT  02/28/2024   IR VENO SAGITTAL SINUS  02/28/2024   IR VENO/JUGULAR LEFT  02/28/2024   RADIOLOGY WITH ANESTHESIA N/A 09/18/2020   Procedure: IR WITH ANESTHESIA ANEURYSM EMBOLIZATION;  Surgeon: Dolphus Carrion, MD;  Location: [facility] OR;  Service: Radiology;  Laterality: N/A;   RADIOLOGY WITH ANESTHESIA N/A 12/16/2020   Procedure: RADIOLOGY WITH ANESTHESIA EMBOLIZATION;  Surgeon: Dolphus Carrion, MD;  Location: [facility] OR;  Service: Radiology;  Laterality: N/A;   RADIOLOGY WITH ANESTHESIA N/A 04/06/2022   Procedure: Aneurysm embolization;  Surgeon: Dolphus Carrion, MD;  Location: [facility] OR;  Service: Radiology;  Laterality: N/A;   RADIOLOGY WITH ANESTHESIA N/A 11/18/2022   Procedure: Aneurysm Embo;  Surgeon: Dolphus Carrion, MD;  Location: [facility] OR;  Service: Radiology;  Laterality: N/A;   RADIOLOGY WITH ANESTHESIA N/A 02/28/2024   Procedure: RADIOLOGY WITH ANESTHESIA;  Surgeon: Lester Golas, MD;  Location: [facility] OR;  Service: Radiology;  Laterality: N/A;  Carotid cavernous embolization    Family History  Problem Relation Age of Onset   Arthritis Mother    Alzheimer's disease Father    Hypertension Father    Gout Father    Hyperlipidemia Brother    Polycystic ovary syndrome Daughter    Social History:  reports that she has never smoked. She has never used smokeless  tobacco. She reports that she does not currently use alcohol. She reports that she does not use drugs.  Allergies: Allergies[1]  (Not in a hospital admission)   No results found for this or any previous visit (from the past 48 hours). No results found.  Review of Systems The patient reports a slight numbness in her upper left thigh; however, denies any other concerns at this time.  Denies pain or discomfort at this time.   Blood pressure (!) 121/91, pulse 65, temperature 98 F (36.7 C), temperature source Oral, resp. rate 17, height 5' 7 (1.702 m), weight 90.3 kg, SpO2 98%. Physical Exam  Physical Exam HENT:     Head: Normocephalic.     Nose: Nose normal.  Eyes:     Pupils: Pupils are equal, round, and reactive to light. Left Conjunctival Injection  Cardiovascular:     Rate and Rhythm: Normal rate.  Pulmonary:     Effort: Pulmonary effort is normal.  Abdominal:     General: Abdomen is flat.  Musculoskeletal:     Cervical back: Normal range of motion.  Neurological:     Mental Status: Patient is alert.     Cranial Nerves: Cranial nerves 2-12 are intact.     Sensory: Sensation is intact.     Motor: Motor function is intact.     Coordination: Coordination is intact.    Assessment/Plan Repeat arteriogram to confirm fistula closure.  I have explained the procedure to the patient and she has expressed understanding of both the procedure, risks (including death, hemorrhage, and stroke), and benefits. She would like to proceed with the procedure.   ASA: 2 M:2  Jayson JINNY Eis, NP 04/08/2024, 8:58 AM       [1] Not on File

## 2024-04-08 NOTE — Brief Op Note (Signed)
" °  NEUROSURGERY BRIEF OP NOTE   PREOP DX: left CCF  POSTOP DX: Same  PROCEDURE: arteriogram  SURGEON: Nancyann LULLA Burns   ANESTHESIA: IV Sedation with Local  EBL: 10  COMPLICATIONS: none  CONDITION: Stable  FINDINGS:  Persistent left CCF  Nancyann LULLA Burns  @today @ 10:10 AM  "

## 2024-04-08 NOTE — Progress Notes (Signed)
Patient and family was given discharge instructions. Both verbalized understanding.

## 2024-04-10 ENCOUNTER — Encounter: Payer: Self-pay | Admitting: Neuroradiology

## 2024-04-10 NOTE — Addendum Note (Signed)
 Addended by: Kyrie Bun on: 04/10/2024 03:00 PM   Modules accepted: Orders

## 2024-04-11 ENCOUNTER — Encounter (HOSPITAL_COMMUNITY): Payer: Self-pay | Admitting: Neuroradiology

## 2024-04-11 ENCOUNTER — Other Ambulatory Visit: Payer: Self-pay

## 2024-04-11 NOTE — Progress Notes (Signed)
 SDW call  Patient was given pre-op instructions over the phone. Patient verbalized understanding of instructions provided.  Denied any SOB, fever or cough   PCP - Nell Piety FNP Cardiologist -  Pulmonary:    PPM/ICD - denies Device Orders - na Rep Notified - na   Chest x-ray -  EKG -  02/28/2024 Stress Test - ECHO - 09/21/2020 Cardiac Cath -   Sleep Study/sleep apnea/CPAP: denies  Non-diabetic   Blood Thinner Instructions: denies Aspirin  Instructions:Continue   ERAS Protcol -  NPO  Anesthesia review: Yes. HTN, brain aneurysm  Your procedure is scheduled on Tuesday April 16, 2024  Report to [facility] Main Entrance A at 0530   A.M., then check in with the Admitting office.  Call this number if you have problems the morning of surgery:  [phone]   If you have any questions prior to your surgery date call [phone]: Open Monday-Friday 8am-4pm If you experience any cold or flu symptoms such as cough, fever, chills, shortness of breath, etc. between now and your scheduled surgery, please notify us  at the above number    Remember:  Do not eat or drink after midnight the night before your surgery  Take these medicines the morning of surgery with A SIP OF WATER:  ASA, cosopt   As needed: tylenol   As of today, STOP taking any Aleve, Naproxen, Ibuprofen, Motrin, Advil, Goody's, BC's, all herbal medications, fish oil, and all vitamins.

## 2024-04-15 NOTE — Anesthesia Preprocedure Evaluation (Signed)
"                                    Anesthesia Evaluation  Patient identified by MRN, date of birth, ID band Patient awake    Reviewed: Allergy & Precautions, H&P , NPO status , Patient's Chart, lab work & pertinent test results  Airway Mallampati: IV  TM Distance: >3 FB Neck ROM: Full  Mouth opening: Limited Mouth Opening  Dental  (+) Dental Advisory Given, Teeth Intact   Pulmonary neg pulmonary ROS   Pulmonary exam normal breath sounds clear to auscultation       Cardiovascular Exercise Tolerance: Good hypertension, Pt. on medications Normal cardiovascular exam Rhythm:Regular Rate:Normal  Echo 6./22 . Left ventricular ejection fraction, by estimation, is 65 to 70%. The  left ventricle has normal function. The left ventricle has no regional  wall motion abnormalities. There is moderate concentric left ventricular  hypertrophy. Left ventricular  diastolic parameters are consistent with Grade I diastolic dysfunction  (impaired relaxation).   2. Right ventricular systolic function is normal. The right ventricular  size is normal.   3. Left atrial size was mildly dilated.   4. The mitral valve is normal in structure. No evidence of mitral valve  regurgitation.   5. The aortic valve is tricuspid. Aortic valve regurgitation is not  visualized.   6. The inferior vena cava is normal in size with greater than 50%  respiratory variability, suggesting right atrial pressure of 3 mmHg.   Comparison(s): No prior Echocardiogram.      Neuro/Psych  Headaches  negative psych ROS   GI/Hepatic negative GI ROS, Neg liver ROS,neg GERD  ,,  Endo/Other  negative endocrine ROS    Renal/GU negative Renal ROS     Musculoskeletal  (+) Arthritis ,    Abdominal  (+) + obese  Peds  Hematology negative hematology ROS (+)   Anesthesia Other Findings   Reproductive/Obstetrics negative OB ROS                              Anesthesia  Physical Anesthesia Plan  ASA: 3  Anesthesia Plan: General   Post-op Pain Management: Tylenol  PO (pre-op)*   Induction: Intravenous  PONV Risk Score and Plan: 4 or greater and Ondansetron , Dexamethasone  and Treatment may vary due to age or medical condition  Airway Management Planned: Oral ETT and Video Laryngoscope Planned  Additional Equipment: Arterial line  Intra-op Plan:   Post-operative Plan: Extubation in OR  Informed Consent: I have reviewed the patients History and Physical, chart, labs and discussed the procedure including the risks, benefits and alternatives for the proposed anesthesia with the patient or authorized representative who has indicated his/her understanding and acceptance.     Dental advisory given  Plan Discussed with: CRNA  Anesthesia Plan Comments: (Risks of anesthesia explained at length. This includes, but is not limited to, sore throat, damage to teeth, lips gums, tongue and vocal cords, nausea and vomiting, reactions to medications, stroke, heart attack, and death. All patient questions were answered and the patient wishes to proceed. )         Anesthesia Quick Evaluation  "

## 2024-04-16 ENCOUNTER — Ambulatory Visit (HOSPITAL_COMMUNITY)
Admission: RE | Admit: 2024-04-16 | Discharge: 2024-04-17 | Disposition: A | Discharge status: Home / Self Care | Attending: Neuroradiology | Admitting: Neuroradiology

## 2024-04-16 ENCOUNTER — Encounter (HOSPITAL_COMMUNITY): Admission: RE | Disposition: A | Payer: Self-pay | Source: Home / Self Care | Attending: Neuroradiology

## 2024-04-16 ENCOUNTER — Other Ambulatory Visit: Payer: Self-pay

## 2024-04-16 ENCOUNTER — Encounter (HOSPITAL_COMMUNITY): Payer: Self-pay | Admitting: Neuroradiology

## 2024-04-16 ENCOUNTER — Ambulatory Visit (HOSPITAL_COMMUNITY): Payer: Self-pay | Admitting: Certified Registered Nurse Anesthetist

## 2024-04-16 ENCOUNTER — Inpatient Hospital Stay (HOSPITAL_COMMUNITY)

## 2024-04-16 DIAGNOSIS — I10 Essential (primary) hypertension: Secondary | ICD-10-CM | POA: Insufficient documentation

## 2024-04-16 DIAGNOSIS — I671 Cerebral aneurysm, nonruptured: Principal | ICD-10-CM | POA: Diagnosis present

## 2024-04-16 DIAGNOSIS — Z23 Encounter for immunization: Secondary | ICD-10-CM | POA: Insufficient documentation

## 2024-04-16 DIAGNOSIS — I6522 Occlusion and stenosis of left carotid artery: Principal | ICD-10-CM | POA: Insufficient documentation

## 2024-04-16 HISTORY — PX: IR VENO SAGITTAL SINUS: IMG687

## 2024-04-16 HISTORY — PX: RADIOLOGY WITH ANESTHESIA: SHX6223

## 2024-04-16 HISTORY — PX: IR ANGIO INTRA EXTRACRAN SEL COM CAROTID INNOMINATE BILAT MOD SED: IMG5360

## 2024-04-16 HISTORY — PX: IR US GUIDE VASC ACCESS RIGHT: IMG2390

## 2024-04-16 LAB — APTT: aPTT: 29 s (ref 24–36)

## 2024-04-16 LAB — CBC
HCT: 41.1 % (ref 36.0–46.0)
Hemoglobin: 13.5 g/dL (ref 12.0–15.0)
MCH: 27.6 pg (ref 26.0–34.0)
MCHC: 32.8 g/dL (ref 30.0–36.0)
MCV: 83.9 fL (ref 80.0–100.0)
Platelets: 247 K/uL (ref 150–400)
RBC: 4.9 MIL/uL (ref 3.87–5.11)
RDW: 15.3 % (ref 11.5–15.5)
WBC: 6.6 K/uL (ref 4.0–10.5)
nRBC: 0 % (ref 0.0–0.2)

## 2024-04-16 LAB — BASIC METABOLIC PANEL WITH GFR
Anion gap: 11 (ref 5–15)
BUN: 26 mg/dL — ABNORMAL HIGH (ref 8–23)
CO2: 28 mmol/L (ref 22–32)
Calcium: 9.4 mg/dL (ref 8.9–10.3)
Chloride: 101 mmol/L (ref 98–111)
Creatinine, Ser: 1.08 mg/dL — ABNORMAL HIGH (ref 0.44–1.00)
GFR, Estimated: 57 mL/min — ABNORMAL LOW
Glucose, Bld: 104 mg/dL — ABNORMAL HIGH (ref 70–99)
Potassium: 3.9 mmol/L (ref 3.5–5.1)
Sodium: 140 mmol/L (ref 135–145)

## 2024-04-16 LAB — TYPE AND SCREEN
ABO/RH(D): O POS
Antibody Screen: NEGATIVE

## 2024-04-16 LAB — POCT ACTIVATED CLOTTING TIME
Activated Clotting Time: 189 s
Activated Clotting Time: 189 s
Activated Clotting Time: 199 s

## 2024-04-16 LAB — PROTIME-INR
INR: 0.9 (ref 0.8–1.2)
Prothrombin Time: 12.8 s (ref 11.4–15.2)

## 2024-04-16 MED ORDER — PHENYLEPHRINE 80 MCG/ML (10ML) SYRINGE FOR IV PUSH (FOR BLOOD PRESSURE SUPPORT)
PREFILLED_SYRINGE | INTRAVENOUS | Status: DC | PRN
  Administered 2024-04-16 (×2): 80 ug via INTRAVENOUS

## 2024-04-16 MED ORDER — NITROGLYCERIN 1 MG/10 ML FOR IR/CATH LAB
INTRA_ARTERIAL | Status: AC
  Filled 2024-04-16: qty 10

## 2024-04-16 MED ORDER — VERAPAMIL HCL 2.5 MG/ML IV SOLN
INTRAVENOUS | Status: AC
  Filled 2024-04-16: qty 2

## 2024-04-16 MED ORDER — ORAL CARE MOUTH RINSE: 15.0000 mL | OROMUCOSAL | Status: DC | PRN

## 2024-04-16 MED ORDER — LACTATED RINGERS IV SOLN: INTRAVENOUS | Status: DC

## 2024-04-16 MED ORDER — HEPARIN SODIUM (PORCINE) 1000 UNIT/ML IJ SOLN
INTRAMUSCULAR | Status: AC
  Filled 2024-04-16: qty 10

## 2024-04-16 MED ORDER — LACTATED RINGERS IV SOLN: INTRAVENOUS | Status: DC | PRN

## 2024-04-16 MED ORDER — ORAL CARE MOUTH RINSE: 15.0000 mL | Freq: Once | OROMUCOSAL | Status: AC

## 2024-04-16 MED ORDER — DEXAMETHASONE SOD PHOSPHATE PF 10 MG/ML IJ SOLN
INTRAMUSCULAR | Status: DC | PRN
  Administered 2024-04-16: 10 mg via INTRAVENOUS

## 2024-04-16 MED ORDER — ACETAMINOPHEN 325 MG PO TABS: 650.0000 mg | ORAL_TABLET | ORAL | Status: DC | PRN

## 2024-04-16 MED ORDER — FENTANYL CITRATE (PF) 100 MCG/2ML IJ SOLN
INTRAMUSCULAR | Status: AC
  Filled 2024-04-16: qty 2

## 2024-04-16 MED ORDER — ROCURONIUM BROMIDE 10 MG/ML (PF) SYRINGE
PREFILLED_SYRINGE | INTRAVENOUS | Status: DC | PRN
  Administered 2024-04-16 (×2): 10 mg via INTRAVENOUS
  Administered 2024-04-16: 20 mg via INTRAVENOUS
  Administered 2024-04-16: 50 mg via INTRAVENOUS
  Administered 2024-04-16: 10 mg via INTRAVENOUS

## 2024-04-16 MED ORDER — IOHEXOL 300 MG/ML  SOLN
150.0000 mL | Freq: Once | INTRAMUSCULAR | Status: AC | PRN
  Administered 2024-04-16: 100 mL via INTRA_ARTERIAL

## 2024-04-16 MED ORDER — ACETAMINOPHEN 500 MG PO TABS: 1000.0000 mg | ORAL_TABLET | Freq: Once | ORAL | Status: DC

## 2024-04-16 MED ORDER — CHLORHEXIDINE GLUCONATE 0.12 % MT SOLN
15.0000 mL | Freq: Once | OROMUCOSAL | Status: AC
  Administered 2024-04-16: 15 mL via OROMUCOSAL
  Filled 2024-04-16: qty 15

## 2024-04-16 MED ORDER — HEPARIN SODIUM (PORCINE) 1000 UNIT/ML IJ SOLN
INTRAMUSCULAR | Status: DC | PRN
  Administered 2024-04-16: 1000 [IU] via INTRAVENOUS
  Administered 2024-04-16: 5000 [IU] via INTRAVENOUS
  Administered 2024-04-16: 2000 [IU] via INTRAVENOUS

## 2024-04-16 MED ORDER — DROPERIDOL 2.5 MG/ML IJ SOLN: 0.6250 mg | Freq: Once | INTRAMUSCULAR | Status: DC | PRN

## 2024-04-16 MED ORDER — ACETAMINOPHEN 160 MG/5ML PO SOLN: 650.0000 mg | ORAL | Status: DC | PRN

## 2024-04-16 MED ORDER — ONDANSETRON HCL 4 MG/2ML IJ SOLN
INTRAMUSCULAR | Status: DC | PRN
  Administered 2024-04-16: 4 mg via INTRAVENOUS

## 2024-04-16 MED ORDER — LIDOCAINE 2% (20 MG/ML) 5 ML SYRINGE
INTRAMUSCULAR | Status: DC | PRN
  Administered 2024-04-16: 80 mg via INTRAVENOUS

## 2024-04-16 MED ORDER — ACETAMINOPHEN 650 MG RE SUPP: 650.0000 mg | RECTAL | Status: DC | PRN

## 2024-04-16 MED ORDER — NITROGLYCERIN 1 MG/10 ML FOR IR/CATH LAB: 200.0000 ug | Freq: Once | INTRA_ARTERIAL | Status: DC

## 2024-04-16 MED ORDER — FENTANYL CITRATE (PF) 100 MCG/2ML IJ SOLN: 25.0000 ug | INTRAMUSCULAR | Status: DC | PRN

## 2024-04-16 MED ORDER — VERAPAMIL HCL 2.5 MG/ML IV SOLN: INTRA_ARTERIAL | Status: AC | PRN

## 2024-04-16 MED ORDER — SUGAMMADEX SODIUM 200 MG/2ML IV SOLN
INTRAVENOUS | Status: DC | PRN
  Administered 2024-04-16: 200 mg via INTRAVENOUS

## 2024-04-16 MED ORDER — IOHEXOL 300 MG/ML  SOLN
100.0000 mL | Freq: Once | INTRAMUSCULAR | Status: AC | PRN
  Administered 2024-04-16: 45 mL via INTRA_ARTERIAL

## 2024-04-16 MED ORDER — FENTANYL CITRATE (PF) 250 MCG/5ML IJ SOLN
INTRAMUSCULAR | Status: DC | PRN
  Administered 2024-04-16 (×2): 50 ug via INTRAVENOUS

## 2024-04-16 MED ORDER — CLEVIDIPINE BUTYRATE 0.5 MG/ML IV EMUL
INTRAVENOUS | Status: AC
  Filled 2024-04-16: qty 50

## 2024-04-16 MED ORDER — PHENYLEPHRINE HCL-NACL 20-0.9 MG/250ML-% IV SOLN
INTRAVENOUS | Status: DC | PRN
  Administered 2024-04-16: 25 ug/min via INTRAVENOUS

## 2024-04-16 MED ORDER — PROPOFOL 10 MG/ML IV BOLUS
INTRAVENOUS | Status: DC | PRN
  Administered 2024-04-16: 30 mg via INTRAVENOUS
  Administered 2024-04-16: 120 mg via INTRAVENOUS

## 2024-04-16 MED ORDER — INFLUENZA VAC SPLIT HIGH-DOSE 0.5 ML IM SUSY
0.5000 mL | PREFILLED_SYRINGE | INTRAMUSCULAR | Status: AC
  Administered 2024-04-17: 0.5 mL via INTRAMUSCULAR
  Filled 2024-04-16: qty 0.5

## 2024-04-16 NOTE — Progress Notes (Signed)
 TR band removed per order at 1530

## 2024-04-16 NOTE — Progress Notes (Signed)
" °  Rose Marquez is a 64 y.o. female, POD 1 s/p Attempted embolization transvenous  for Left CCF. she is currently 1 days into her hospital stay.   Family is present at the bedside; Appears calm and supportive; expressed verbal acceptance of the current clinical trajectory. They expressed that they appreciate Dr.Hecks honesty and are grateful for his care.   Assessment:  Today the patient is alert and oriented time, date, place, city, president.  The patients pain is currently a 0/10, which has Hasn't changed  since the last assessment  she reports little resolution of pre-op symptoms.   Current symptoms include Conjunctival injection and proptosis of the Left eye. Denies any other concerns at this time.    Plan:   I had the pleasure of seeing Rose Marquez today. She appears to be progressing well at this time. The family is content with the care that they have been receiving.   The primary goal for today is to continue to monitor her neuro status and the access site.   Current plan is to discharge tomorrow morning.      "

## 2024-04-16 NOTE — Anesthesia Procedure Notes (Signed)
 Arterial Line Insertion Start/End1/20/2026 8:22 AM, 04/16/2024 8:22 AM Performed by: CRNA  Patient location: Pre-op. Preanesthetic checklist: patient identified, IV checked, site marked, risks and benefits discussed, surgical consent, monitors and equipment checked, pre-op evaluation, timeout performed and anesthesia consent Lidocaine  1% used for infiltration Left, radial was placed Catheter size: 20 G Hand hygiene performed , maximum sterile barriers used  and Seldinger technique used Allen's test indicative of satisfactory collateral circulation Attempts: 1 Ultrasound Notes:relevant anatomy identified, ultrasound used to visualize needle entry and vessel patent under ultrasound. Following insertion, dressing applied and Biopatch. Post procedure assessment: normal  Patient tolerated the procedure well with no immediate complications.

## 2024-04-16 NOTE — H&P (Signed)
 "    Chief Complaint: Patient was seen in consultation today for No chief complaint on file.  at the request of * No referring provider recorded for this case *  Referring Physician(s): * No referring provider recorded for this case *  Rose Marquez is a 64 y.o. 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  Assessment and Plan  Low-flow direct left carotid cavernous fistula.  Here today for attempted transvenous treatment. 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  She then had multiple treatments with flow diverting stents, which failed to close the fistula, and then treatment with a covered stent, which failed to close the fistula and also thrombosed.  All of this prevented transarterial access to the fistula.  A month ago I did a test occlusion of the internal carotid artery and occluded the internal carotid artery proximal and distal to the fistula.  Unfortunately, there is retrograde flow from the ophthalmic artery through the coil pack and the fistula has remained patent.  She is here for another attempted transvenous treatment.  We have discussed the risks of the procedure, most importantly the small risk of stroke due to the arterial catheterization.  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   Gallstones    Glaucoma    Headache    HLD (hyperlipidemia)    Hypertension     Past Surgical History:  Procedure Laterality Date   CHOLECYSTECTOMY     IR 3D INDEPENDENT WKST  09/18/2020   IR 3D INDEPENDENT WKST  11/18/2022   IR 3D INDEPENDENT WKST  11/07/2023   IR ANGIO EXTERNAL CAROTID SEL EXT CAROTID UNI L MOD SED  11/18/2022   IR ANGIO EXTERNAL CAROTID SEL EXT CAROTID UNI L MOD SED  11/15/2023   IR ANGIO INTRA EXTRACRAN SEL COM CAROTID INNOMINATE BILAT MOD SED  09/18/2020    IR ANGIO INTRA EXTRACRAN SEL COM CAROTID INNOMINATE BILAT MOD SED  09/17/2020   IR ANGIO INTRA EXTRACRAN SEL COM CAROTID INNOMINATE UNI R MOD SED  11/18/2022   IR ANGIO INTRA EXTRACRAN SEL COM CAROTID INNOMINATE UNI R MOD SED  04/08/2024   IR ANGIO INTRA EXTRACRAN SEL INTERNAL CAROTID BILAT MOD SED  04/06/2022   IR ANGIO INTRA EXTRACRAN SEL INTERNAL CAROTID UNI L MOD SED  11/07/2023   IR ANGIO INTRA EXTRACRAN SEL INTERNAL CAROTID UNI L MOD SED  02/28/2024   IR ANGIO VERTEBRAL SEL VERTEBRAL UNI R MOD SED  09/18/2020   IR ANGIO VERTEBRAL SEL VERTEBRAL UNI R MOD SED  09/17/2020   IR ANGIO VERTEBRAL SEL VERTEBRAL UNI R MOD SED  02/28/2024   IR ANGIOGRAM FOLLOW UP STUDY  04/06/2022   IR ANGIOGRAM FOLLOW UP STUDY  11/18/2022   IR ANGIOGRAM FOLLOW UP STUDY  02/28/2024   IR ANGIOGRAM FOLLOW UP STUDY  02/28/2024   IR ANGIOGRAM FOLLOW UP STUDY  02/28/2024   IR ANGIOGRAM FOLLOW UP STUDY  02/28/2024   IR ANGIOGRAM FOLLOW UP STUDY  02/28/2024   IR ANGIOGRAM FOLLOW UP STUDY  02/28/2024   IR CT HEAD LTD  09/18/2020   IR CT HEAD LTD  12/16/2020   IR CT HEAD LTD  04/06/2022   IR CT HEAD LTD  11/18/2022   IR NEURO EACH ADD'L AFTER BASIC UNI LEFT (MS)  04/06/2022  IR RADIOLOGIST EVAL & MGMT  10/12/2020   IR RADIOLOGIST EVAL & MGMT  11/18/2020   IR RADIOLOGIST EVAL & MGMT  01/01/2021   IR RADIOLOGIST EVAL & MGMT  04/15/2021   IR RADIOLOGIST EVAL & MGMT  03/07/2022   IR RADIOLOGIST EVAL & MGMT  04/21/2022   IR RADIOLOGIST EVAL & MGMT  12/12/2022   IR TRANSCATH/EMBOLIZ  09/18/2020   IR TRANSCATH/EMBOLIZ  12/16/2020   IR TRANSCATH/EMBOLIZ  04/06/2022   IR TRANSCATH/EMBOLIZ  11/18/2022   IR TRANSCATH/EMBOLIZ  02/28/2024   IR US  GUIDE VASC ACCESS RIGHT  09/18/2020   IR US  GUIDE VASC ACCESS RIGHT  09/17/2020   IR US  GUIDE VASC ACCESS RIGHT  02/28/2024   IR US  GUIDE VASC ACCESS RIGHT  02/28/2024   IR US  GUIDE VASC ACCESS RIGHT  04/08/2024   IR VENO SAGITTAL SINUS  02/28/2024   IR VENO/JUGULAR LEFT  02/28/2024   RADIOLOGY WITH  ANESTHESIA N/A 09/18/2020   Procedure: IR WITH ANESTHESIA ANEURYSM EMBOLIZATION;  Surgeon: Dolphus Carrion, MD;  Location: [facility] OR;  Service: Radiology;  Laterality: N/A;   RADIOLOGY WITH ANESTHESIA N/A 12/16/2020   Procedure: RADIOLOGY WITH ANESTHESIA EMBOLIZATION;  Surgeon: Dolphus Carrion, MD;  Location: [facility] OR;  Service: Radiology;  Laterality: N/A;   RADIOLOGY WITH ANESTHESIA N/A 04/06/2022   Procedure: Aneurysm embolization;  Surgeon: Dolphus Carrion, MD;  Location: [facility] OR;  Service: Radiology;  Laterality: N/A;   RADIOLOGY WITH ANESTHESIA N/A 11/18/2022   Procedure: Aneurysm Embo;  Surgeon: Dolphus Carrion, MD;  Location: [facility] OR;  Service: Radiology;  Laterality: N/A;   RADIOLOGY WITH ANESTHESIA N/A 02/28/2024   Procedure: RADIOLOGY WITH ANESTHESIA;  Surgeon: Lester Golas, MD;  Location: [facility] OR;  Service: Radiology;  Laterality: N/A;  Carotid cavernous embolization    Allergies: Patient has no known allergies.  Medications: Prior to Admission medications  Medication Sig Start Date End Date Taking? Authorizing Provider  acetaminophen  (TYLENOL ) 500 MG tablet Take 1,000 mg by mouth every 6 (six) hours as needed for mild pain (pain score 1-3) or moderate pain (pain score 4-6).   Yes [provider]  ASPIRIN  EC ADULT LOW DOSE 81 MG tablet Take 81 mg by mouth daily.   Yes [provider]  dorzolamide -timolol  (COSOPT ) 22.3-6.8 MG/ML ophthalmic solution Place 1 drop into the left eye 2 (two) times daily. 07/22/21  Yes [provider]  hydrochlorothiazide  (HYDRODIURIL ) 25 MG tablet Take 1 tablet by mouth daily. 08/02/23  Yes [provider]  Multiple Vitamin (MULTIVITAMIN WITH MINERALS) TABS tablet Take 1 tablet by mouth daily.   Yes [provider]  olmesartan (BENICAR) 20 MG tablet Take 20 mg by mouth daily.   Yes [provider]  rosuvastatin  (CRESTOR ) 5 MG tablet Take 5 mg by mouth every evening.   Yes [provider]      Family History  Problem Relation Age of Onset   Arthritis Mother    Alzheimer's disease Father    Hypertension Father    Gout Father    Hyperlipidemia Brother    Polycystic ovary syndrome Daughter     Social History   Socioeconomic History   Marital status: Single    Spouse name: Not on file   Number of children: 1   Years of education: Not on file   Highest education level: Not on file  Occupational History   Occupation: IT TRAINER  Tobacco Use   Smoking status: Never   Smokeless tobacco: Never  Vaping Use   Vaping status: Never  Used  Substance and Sexual Activity   Alcohol use: Not Currently   Drug use: Never   Sexual activity: Not Currently    Birth control/protection: Post-menopausal  Other Topics Concern   Not on file  Social History Narrative   Not on file   Social Drivers of Health   Tobacco Use: Low Risk (04/16/2024)   Patient History    Smoking Tobacco Use: Never    Smokeless Tobacco Use: Never    Passive Exposure: Not on file  Financial Resource Strain: Not on file  Food Insecurity: Not on file  Transportation Needs: Not on file  Physical Activity: Not on file  Stress: Not on file  Social Connections: Not on file  Depression (EYV7-0): Not on file  Alcohol Screen: Not on file  Housing: Not on file  Utilities: Not on file  Health Literacy: Not on file    Review of Systems  Respiratory:  Negative for shortness of breath.   Cardiovascular:  Negative for chest pain.    Vital Signs:  BP 111/80   Pulse 81   Temp 98.1 F (36.7 C) (Oral)   Resp 18   Ht 5' 7 (1.702 m)   Wt 90.3 kg   SpO2 95%   BMI 31.17 kg/m   Physical Exam  She feels well this morning and is in no distress. Lungs are clear. Heart sounds are normal. Abdomen soft.  Imaging: As noted above in HPI Results   Labs:  CBC: Recent Labs    11/07/23 0854 02/28/24 0705 04/16/24 0630  WBC 6.1 6.8 6.6  HGB 13.1 13.6 13.5  HCT 40.6 42.4 41.1  PLT 211 223 247     COAGS: Recent Labs    11/07/23 0854 02/28/24 0705 04/16/24 0630  INR 1.0 1.0 0.9  APTT  --  28 29    BMP: Recent Labs    11/07/23 0854 02/28/24 0705 04/16/24 0630  NA 140 141 140  K 3.7 3.6 3.9  CL 105 105 101  CO2 24 26 28   GLUCOSE 101* 99 104*  BUN 24* 22 26*  CALCIUM  9.0 9.1 9.4  CREATININE 0.96 0.92 1.08*  GFRNONAA >60 >60 57*    LIVER FUNCTION TESTS: No results for input(s): BILITOT, AST, ALT, ALKPHOS, PROT, ALBUMIN in the last 8760 hours.    Electronically Signed: Nancyann LULLA Burns  04/16/2024, 7:37 AM   "

## 2024-04-16 NOTE — Brief Op Note (Signed)
" °  NEUROSURGERY BRIEF OP NOTE   PREOP DX: Left CCF  POSTOP DX: Same  PROCEDURE: Attempted embolization transvenous  SURGEON: Nancyann LULLA Burns   ANESTHESIA: GETA  EBL: 50  COMPLICATIONS: No immediate  CONDITION: Stable  FINDINGS:  Left CCF, direct, with outflow only to left SOV which is obstructed, stenotic.    Nancyann LULLA Burns  @today @ 12:21 PM  "

## 2024-04-16 NOTE — Anesthesia Postprocedure Evaluation (Signed)
"   Anesthesia Post Note  Patient: Rose Marquez  Procedure(s) Performed: RADIOLOGY WITH ANESTHESIA     Patient location during evaluation: PACU Anesthesia Type: General Level of consciousness: sedated and patient cooperative Pain management: pain level controlled Vital Signs Assessment: post-procedure vital signs reviewed and stable Respiratory status: spontaneous breathing Cardiovascular status: stable Anesthetic complications: no   No notable events documented.  Last Vitals:  Vitals:   04/16/24 1500 04/16/24 1927  BP: 115/81 100/67  Pulse: 72   Resp:  20  Temp:  36.7 C  SpO2: 100%     Last Pain:  Vitals:   04/16/24 1940  TempSrc:   PainSc: 0-No pain                 Norleen Pope      "

## 2024-04-16 NOTE — Progress Notes (Signed)
 Patient arrived from .Pacu VSS alert and oriented x 4 CCMD notifed, CHG bath completed, Bed in lowest position with call light in reach current plan of care in progress

## 2024-04-16 NOTE — Transfer of Care (Signed)
 Immediate Anesthesia Transfer of Care Note  Patient: Rose Marquez  Procedure(s) Performed: RADIOLOGY WITH ANESTHESIA  Patient Location: PACU  Anesthesia Type:General  Level of Consciousness: awake, alert , oriented, and patient cooperative  Airway & Oxygen Therapy: Patient Spontanous Breathing and Patient connected to face mask oxygen  Post-op Assessment: Report given to RN, Post -op Vital signs reviewed and stable, Patient moving all extremities, and Patient moving all extremities X 4  Post vital signs: Reviewed and stable  Last Vitals:  Vitals Value Taken Time  BP 122/86 04/16/24 12:49  Temp 36.8 C 04/16/24 12:49  Pulse 104 04/16/24 12:54  Resp 17 04/16/24 12:54  SpO2 93 % 04/16/24 12:54  Vitals shown include unfiled device data.  Last Pain:  Vitals:   04/16/24 1249  TempSrc:   PainSc: 0-No pain      Patients Stated Pain Goal: 0 (04/16/24 0626)  Complications: No notable events documented.

## 2024-04-16 NOTE — Anesthesia Procedure Notes (Signed)
 Procedure Name: Intubation Date/Time: 04/16/2024 8:06 AM  Performed by: Arvell Edsel HERO, CRNAPre-anesthesia Checklist: Patient identified, Emergency Drugs available, Suction available, Patient being monitored and Timeout performed Patient Re-evaluated:Patient Re-evaluated prior to induction Oxygen Delivery Method: Circle system utilized Preoxygenation: Pre-oxygenation with 100% oxygen Induction Type: IV induction Ventilation: Mask ventilation without difficulty Laryngoscope Size: Glidescope and 3 Grade View: Grade I Tube type: Oral Tube size: 7.5 mm Number of attempts: 1 Airway Equipment and Method: Patient positioned with wedge pillow and Video-laryngoscopy Placement Confirmation: ETT inserted through vocal cords under direct vision, positive ETCO2 and breath sounds checked- equal and bilateral Secured at: 22 cm Tube secured with: Tape Dental Injury: Teeth and Oropharynx as per pre-operative assessment

## 2024-04-17 ENCOUNTER — Encounter (HOSPITAL_COMMUNITY): Payer: Self-pay | Admitting: Neuroradiology

## 2024-04-17 NOTE — Discharge Instructions (Addendum)
 Continue home medications as prescribed  Follow up in clinic in 4-6 weeks to discuss future procedures. Please give us  a call to have this scheduled after you discharge.   [employer] Exposure: You may shower after 24 hours. Do not soak site in a bathtub, hot tub, swimming pool, or wash dishes (any submerged water) for 3-5 days to prevent infection. Lotion: Do not apply lotions, creams, or ointments to the puncture site until it is fully healed.  Activity: Lifting: Do not lift anything heavier than 5-10lbs (approx. A gallon of milk) with the accessed arm for at least 3 days.  Movement: Avoid strenuous repetitive motion with the accessed wrist for 3 days.  Managing Bleeding:  What is normal: A small bruise or pea-sized lump at the site.  What is not normal: Active bleeding or rapidly growing lump at the site.  Should bleeding occur: 1) lie down 2) Apply firm, direct pressure on the puncture site 3) Hold pressure for 20 minutes DO NOT peak to see if it stopped 4) If bleeding continues after 20 minutes, call 911 or go to the nearest emergency room while continuing to hold pressure.   When To Call A Doctor:  Site Issues: Significant swelling at the access site that is increasing in size. Drainage (pus) from the site.  Circulation Issues: distal features (like fingers and toes) on the accessed site become cold, pale, blue, numb, or painful. Infection: Fever greater than 101 degrees fahrenheit or redness/warmth spreading around the site.  Systemic: Chest pain, Shortness of breath, or dizziness.

## 2024-04-17 NOTE — Progress Notes (Signed)
" °  Rose Marquez is a 64 y.o. female, POD 1 s/p Attempted embolization transvenous  for Left [facility]. she is currently 1 days into her hospital stay.   Assessment:  No changes to assessment today  Today the patient is alert and oriented time, date, place, city, president.  The patients pain is currently a 0/10, which has Hasn't changed  since the last assessment  she reports little resolution of pre-op symptoms.   Current symptoms include Conjunctival injection and proptosis of the Left eye. Denies any other concerns at this time.    Plan:   I had the pleasure of seeing Rose Marquez today. She appears to be progressing well at this time. The family is content with the care that they have been receiving.   The primary goal for today is to discharge the patient.       "

## 2024-04-17 NOTE — Progress Notes (Signed)
 I spoke with the patient and the daughter. I wanted to see if they would like to speak with Dr.Heck personally before discharge.   They both expressed that all of their questions have been addressed and that they are willing to wait until the follow up appointment in 4-6 weeks for further discussion.   I will d/c now.

## 2024-04-17 NOTE — Discharge Summary (Signed)
 " Physician Discharge Summary      Physician Discharge Summary   Patient: Rose Marquez MRN: [id number] DOB: [birth year]  Admit date:     04/16/2024  Discharge date: 04/17/24  Discharge Physician: Jayson PARAS [place] Spargo  Disposition: Home  PCP: Lenon Nell SAILOR, FNP   Procedures  Attempted embolization transvenous  for Left [facility]   Discharge Diagnoses:   Principal Problem:   Carotid-cavernous fistula  Resolved Problems:   * No resolved hospital problems. *   Discharge summary   The patient was admitted on 04/16/2024  for Attempted embolization transvenous  for Left [facility] . The operation was uncomplicated but unsuccessful. She will follow up in clinic in 4-6 weeks to discuss future options.  Currently the patient is ambulating, with pain controlled, tolerating diet, and voiding at time of discharge.   The patient has been instructed to follow up with [facility] at [facility] with instructions provided as below.  Post-hospitalization questions to be answered by [facility] at [facility] team.   Discharge Plan     Discharge Instructions      Continue home medications as prescribed  Follow up in clinic in 4-6 weeks to discuss future procedures. Please give us  a call to have this scheduled after you discharge.   [employer] Exposure: You may shower after 24 hours. Do not soak site in a bathtub, hot tub, swimming pool, or wash dishes (any submerged water) for 3-5 days to prevent infection. Lotion: Do not apply lotions, creams, or ointments to the puncture site until it is fully healed.  Activity: Lifting: Do not lift anything heavier than 5-10lbs (approx. A gallon of milk) with the accessed arm for at least 3 days.  Movement: Avoid strenuous repetitive motion with the accessed wrist for 3 days.  Managing Bleeding:  What is normal: A small bruise or pea-sized lump at the site.  What is not normal: Active bleeding or rapidly growing lump at the site.   Should bleeding occur: 1) lie down 2) Apply firm, direct pressure on the puncture site 3) Hold pressure for 20 minutes DO NOT peak to see if it stopped 4) If bleeding continues after 20 minutes, call 911 or go to the nearest emergency room while continuing to hold pressure.   When To Call A Doctor:  Site Issues: Significant swelling at the access site that is increasing in size. Drainage (pus) from the site.  Circulation Issues: distal features (like fingers and toes) on the accessed site become cold, pale, blue, numb, or painful. Infection: Fever greater than 101 degrees fahrenheit or redness/warmth spreading around the site.  Systemic: Chest pain, Shortness of breath, or dizziness.     Discharge disposition: 01-Home or Self Care       Discharge Instructions     Call MD for:  difficulty breathing, headache or visual disturbances   Complete by: As directed    Call MD for:  persistant dizziness or light-headedness   Complete by: As directed    Call MD for:  persistant nausea and vomiting   Complete by: As directed    Call MD for:  redness, tenderness, or signs of infection (pain, swelling, redness, odor or green/yellow discharge around incision site)   Complete by: As directed    Diet general   Complete by: As directed    Discharge instructions   Complete by: As directed    [employer] Exposure: You may shower after 24 hours. Do not soak wrist in  a bathtub, hot tub, swimming pool, or wash dishes (any submerged water) for 3-5 days to prevent infection. Lotion: Do not apply lotions, creams, or ointments to the puncture site until it is fully healed.  Activity: Lifting: Do not lift anything heavier than 5-10lbs (approx. A gallon of milk) with the accessed arm for at least 3 days.  Movement: Avoid strenuous repetitive motion with the accessed wrist for 3 days.  Managing Bleeding:  What is normal: A small bruise or pea-sized lump at the site.  What is not normal:  Active bleeding or rapidly growing lump at the site.  Should bleeding occur: 1) lie down 2) Apply firm, direct pressure on the puncture site 3) Hold pressure for 20 minutes DO NOT peak to see if it stopped 4) If bleeding continues after 20 minutes, call 911 or go to the nearest emergency room while continuing to hold pressure.   When To Call A Doctor:  Site Issues: Significant swelling at the access site that is increasing in size. Drainage (pus) from the site.  Circulation Issues: distal features (like fingers and toes) on the accessed site become cold, pale, blue, numb, or painful. Infection: Fever greater than 101 degrees fahrenheit or redness/warmth spreading around the site.  Systemic: Chest pain, Shortness of breath, or dizziness.   Increase activity slowly   Complete by: As directed           Medications at Discharge   Allergies as of 04/17/2024   No Known Allergies      Medication List     TAKE these medications    acetaminophen  500 MG tablet Commonly known as: TYLENOL  Take 1,000 mg by mouth every 6 (six) hours as needed for mild pain (pain score 1-3) or moderate pain (pain score 4-6).   Aspirin  EC Adult Low Dose 81 MG tablet Generic drug: aspirin  EC Take 81 mg by mouth daily.   dorzolamide -timolol  2-0.5 % ophthalmic solution Commonly known as: COSOPT  Place 1 drop into the left eye 2 (two) times daily.   hydrochlorothiazide  25 MG tablet Commonly known as: HYDRODIURIL  Take 1 tablet by mouth daily.   multivitamin with minerals Tabs tablet Take 1 tablet by mouth daily.   olmesartan 20 MG tablet Commonly known as: BENICAR Take 20 mg by mouth daily.   rosuvastatin  5 MG tablet Commonly known as: CRESTOR  Take 5 mg by mouth every evening.         Condition at Discharge   The patient appears to be calm in no distress.   BP 121/81 (BP Location: Right Arm)   Pulse 99   Temp 98.1 F (36.7 C) (Oral)   Resp 18   Ht 5' 7 (1.702 m)   Wt 90.3 kg   SpO2  96%   BMI 31.17 kg/m   Physical Exam  HENT:  Head: Normocephalic.  Nose: Nose normal.  Eyes:  Pupils: Pupils are equal, round, and reactive to light.   Conjunctival injection and proptosis of the Left eye. No change from admission.   Cardiovascular:  Rate and Rhythm: Normal. Pulmonary:  Effort: Pulmonary effort is normal. Lungs are clear. Abdominal:  General: Abdomen is flat. Soft. Denies pain.  Musculoskeletal:  Cervical back: Normal range of motion.  Neurological:  Mental Status: Patient is alert.  Cranial Nerves: Cranial nerves 2-12 are intact.  Sensory: Sensation is intact.  Motor: Ambulating and moving all 4 extremities. Motor function is intact.  Coordination: Coordination is intact.   Skin:  Access site looks Clean, Dry, and  intact. No hematoma    Condition at discharge:  Stable         Physician Statement:   The Patient was personally examined, the discharge assessment and plan has been personally reviewed and I agree with ACNP Babcock's assessment and plan. 30 minutes of time have been dedicated to discharge assessment, planning and discharge instructions.    SignedBETHA Jayson PARAS Riah Kehoe 04/17/2024, 9:32 AM   "

## 2024-04-18 ENCOUNTER — Other Ambulatory Visit: Payer: Self-pay | Admitting: Neuroradiology

## 2024-04-18 ENCOUNTER — Encounter (HOSPITAL_COMMUNITY): Payer: Self-pay | Admitting: Neuroradiology

## 2024-04-18 DIAGNOSIS — I671 Cerebral aneurysm, nonruptured: Secondary | ICD-10-CM

## 2024-04-18 HISTORY — PX: IR VENO/JUGULAR RIGHT: IMG2275

## 2024-04-18 HISTORY — PX: IR ANGIO VERTEBRAL SEL VERTEBRAL UNI R MOD SED: IMG5368

## 2024-05-29 ENCOUNTER — Encounter: Admitting: Neuroradiology
